# Patient Record
Sex: Female | Born: 1975 | Race: Black or African American | Hispanic: No | Marital: Single | Smoking: Never smoker
Health system: Southern US, Community
[De-identification: ages and names within clinical notes are randomized; demographics above are authoritative.]

## PROBLEM LIST (undated history)

## (undated) DIAGNOSIS — I10 Essential (primary) hypertension: Secondary | ICD-10-CM

## (undated) DIAGNOSIS — D509 Iron deficiency anemia, unspecified: Secondary | ICD-10-CM

## (undated) DIAGNOSIS — G932 Benign intracranial hypertension: Secondary | ICD-10-CM

## (undated) DIAGNOSIS — K219 Gastro-esophageal reflux disease without esophagitis: Secondary | ICD-10-CM

## (undated) DIAGNOSIS — N809 Endometriosis, unspecified: Secondary | ICD-10-CM

## (undated) DIAGNOSIS — N921 Excessive and frequent menstruation with irregular cycle: Secondary | ICD-10-CM

## (undated) DIAGNOSIS — E876 Hypokalemia: Secondary | ICD-10-CM

## (undated) HISTORY — DX: Essential (primary) hypertension: I10

## (undated) HISTORY — DX: Iron deficiency anemia, unspecified: D50.9

## (undated) HISTORY — DX: Gastro-esophageal reflux disease without esophagitis: K21.9

## (undated) HISTORY — DX: Endometriosis, unspecified: N80.9

## (undated) HISTORY — DX: Morbid (severe) obesity due to excess calories: E66.01

## (undated) HISTORY — DX: Hypokalemia: E87.6

## (undated) HISTORY — DX: Benign intracranial hypertension: G93.2

## (undated) HISTORY — DX: Excessive and frequent menstruation with irregular cycle: N92.1

---

## 2000-07-21 HISTORY — PX: TUBAL LIGATION: SHX77

## 2004-06-12 ENCOUNTER — Ambulatory Visit: Payer: Self-pay | Admitting: Neurology

## 2004-10-24 ENCOUNTER — Ambulatory Visit: Payer: Self-pay | Admitting: Neurology

## 2004-10-28 ENCOUNTER — Ambulatory Visit: Payer: Self-pay | Admitting: Neurology

## 2005-03-07 ENCOUNTER — Ambulatory Visit: Payer: Self-pay | Admitting: Neurology

## 2005-07-21 HISTORY — PX: TONSILLECTOMY: SUR1361

## 2006-01-07 ENCOUNTER — Ambulatory Visit: Payer: Self-pay | Admitting: Otolaryngology

## 2007-02-25 ENCOUNTER — Emergency Department: Payer: Self-pay | Admitting: Unknown Physician Specialty

## 2011-01-17 ENCOUNTER — Inpatient Hospital Stay (HOSPITAL_COMMUNITY)
Admission: AD | Admit: 2011-01-17 | Discharge: 2011-01-17 | Disposition: A | Discharge status: Home / Self Care | Payer: 59 | Source: Ambulatory Visit | Attending: Obstetrics & Gynecology | Admitting: Obstetrics & Gynecology

## 2011-01-17 DIAGNOSIS — R42 Dizziness and giddiness: Secondary | ICD-10-CM

## 2011-01-17 DIAGNOSIS — D649 Anemia, unspecified: Secondary | ICD-10-CM | POA: Insufficient documentation

## 2011-01-17 LAB — URINE MICROSCOPIC-ADD ON

## 2011-01-17 LAB — URINALYSIS, ROUTINE W REFLEX MICROSCOPIC
Glucose, UA: NEGATIVE mg/dL
Leukocytes, UA: NEGATIVE
Protein, ur: 100 mg/dL — AB
Specific Gravity, Urine: >1.03 — ABNORMAL HIGH (ref 1.005–1.030)
pH: 6 (ref 5.0–8.0)

## 2011-01-17 LAB — COMPREHENSIVE METABOLIC PANEL
Albumin: 3.2 g/dL — ABNORMAL LOW (ref 3.5–5.2)
BUN: 14 mg/dL (ref 6–23)
Chloride: 101 mEq/L (ref 96–112)
Creatinine, Ser: 0.84 mg/dL (ref 0.50–1.10)
Total Bilirubin: 0.3 mg/dL (ref 0.3–1.2)

## 2011-01-17 LAB — GLUCOSE, CAPILLARY: Glucose-Capillary: 110 mg/dL — ABNORMAL HIGH (ref 70–99)

## 2011-01-17 LAB — CBC
Hemoglobin: 9.8 g/dL — ABNORMAL LOW (ref 12.0–15.0)
RBC: 4.28 MIL/uL (ref 3.87–5.11)
WBC: 7.8 10*3/uL (ref 4.0–10.5)

## 2011-01-17 LAB — POCT PREGNANCY, URINE: Preg Test, Ur: NEGATIVE

## 2011-06-19 ENCOUNTER — Emergency Department (INDEPENDENT_AMBULATORY_CARE_PROVIDER_SITE_OTHER): Payer: 59

## 2011-06-19 ENCOUNTER — Emergency Department (HOSPITAL_COMMUNITY)
Admission: EM | Admit: 2011-06-19 | Discharge: 2011-06-19 | Disposition: A | Discharge status: Home / Self Care | Payer: 59 | Source: Home / Self Care | Attending: Family Medicine | Admitting: Family Medicine

## 2011-06-19 DIAGNOSIS — S6390XA Sprain of unspecified part of unspecified wrist and hand, initial encounter: Secondary | ICD-10-CM

## 2011-06-19 DIAGNOSIS — S63619A Unspecified sprain of unspecified finger, initial encounter: Secondary | ICD-10-CM

## 2011-06-19 NOTE — ED Notes (Signed)
Pt states she shut door (inside her home) on her rt index finger last Friday.  C/o swelling, pain and limited movement rt index finger.

## 2011-06-19 NOTE — ED Provider Notes (Signed)
History     CSN: [id number] Arrival date & time: 06/19/2011  8:45 AM   First MD Initiated Contact with Patient 06/19/11 [phone]      Chief Complaint  Patient presents with  . Finger Injury    (Consider location/radiation/quality/duration/timing/severity/associated sxs/prior treatment) Patient is a 35 y.o. female presenting with hand injury. The history is provided by the patient.  Hand Injury  The incident occurred more than 2 days ago. The incident occurred at home. Injury mechanism: closed a door on right index finger. The quality of the pain is described as aching. The pain is mild. The symptoms are aggravated by movement. She has tried nothing for the symptoms.    History reviewed. No pertinent past medical history.  History reviewed. No pertinent past surgical history.  History reviewed. No pertinent family history.  History  Substance Use Topics  . Smoking status: Never Smoker   . Smokeless tobacco: Not on file  . Alcohol Use: Yes     occasional    OB History    Grav Para Term Preterm Abortions TAB SAB Ect Mult Living                  Review of Systems  Constitutional: Negative.   HENT: Negative.   Respiratory: Negative.   Cardiovascular: Negative.   Gastrointestinal: Negative.   Genitourinary: Negative.     Allergies  Review of patient's allergies indicates no known allergies.  Home Medications  No current outpatient prescriptions on file.  BP 153/91  Pulse 82  Temp(Src) 98.5 F (36.9 C) (Oral)  Resp 18  SpO2 100%  LMP 06/06/2011  Physical Exam  Nursing note and vitals reviewed. Constitutional: She appears well-developed and well-nourished.  Cardiovascular: Normal rate, regular rhythm and normal heart sounds.   Pulmonary/Chest: Breath sounds normal.  Musculoskeletal:       eval of the right index finger reveals swelling. No deformity. Pain at the pip joint. No skin discoloration and skin is intact. Pain with rom. Good cap refill . Sensory  intact distally.     ED Course  Procedures (including critical care time)  Labs Reviewed - No data to display Dg Finger Index Right  06/19/2011  *RADIOLOGY REPORT*  Clinical Data: Slammed the right index finger in door  RIGHT INDEX FINGER 2+V  Comparison: None.  Findings: There is diffuse soft tissue swelling about the index finger.  Tiny osseous structure adjacent to the volar aspect of the PIP joint may represent the sequela of age indeterminate avulsion injury.  Otherwise, no fracture.  No radiopaque foreign body. Joint spaces are preserved.  IMPRESSION: Osseous structure adjacent to the volar aspect of the PIP joint possibly the sequela of age indeterminate avulsive injury. Correlation for point tenderness at this location is recommended.  Original Report Authenticated By: Waynard Reeds, M.D.     1. Finger sprain       MDM          Randa Spike, MD 06/19/11 1038

## 2011-10-14 LAB — HM PAP SMEAR

## 2012-10-13 ENCOUNTER — Ambulatory Visit (INDEPENDENT_AMBULATORY_CARE_PROVIDER_SITE_OTHER): Payer: 59 | Admitting: Internal Medicine

## 2012-10-13 ENCOUNTER — Encounter: Payer: Self-pay | Admitting: Internal Medicine

## 2012-10-13 VITALS — BP 120/88 | HR 95 | Temp 98.0°F | Ht 67.5 in | Wt 247.8 lb

## 2012-10-13 DIAGNOSIS — K219 Gastro-esophageal reflux disease without esophagitis: Secondary | ICD-10-CM

## 2012-10-13 DIAGNOSIS — G932 Benign intracranial hypertension: Secondary | ICD-10-CM

## 2012-10-13 DIAGNOSIS — D649 Anemia, unspecified: Secondary | ICD-10-CM

## 2012-10-13 DIAGNOSIS — R5383 Other fatigue: Secondary | ICD-10-CM

## 2012-10-13 DIAGNOSIS — K5900 Constipation, unspecified: Secondary | ICD-10-CM

## 2012-10-13 DIAGNOSIS — R011 Cardiac murmur, unspecified: Secondary | ICD-10-CM

## 2012-10-13 DIAGNOSIS — Z13220 Encounter for screening for lipoid disorders: Secondary | ICD-10-CM

## 2012-10-13 DIAGNOSIS — I10 Essential (primary) hypertension: Secondary | ICD-10-CM

## 2012-10-13 MED ORDER — PANTOPRAZOLE SODIUM 40 MG PO TBEC: 40.0000 mg | DELAYED_RELEASE_TABLET | Freq: Every day | ORAL | Status: DC

## 2012-10-13 NOTE — Patient Instructions (Addendum)
I am going to start you on protonix 40mg  - one per day.  Take 30 min before breakfast.  Let me know if persistent problems.

## 2012-10-14 ENCOUNTER — Encounter: Payer: Self-pay | Admitting: Internal Medicine

## 2012-10-14 DIAGNOSIS — D649 Anemia, unspecified: Secondary | ICD-10-CM | POA: Insufficient documentation

## 2012-10-14 DIAGNOSIS — K5909 Other constipation: Secondary | ICD-10-CM | POA: Insufficient documentation

## 2012-10-14 DIAGNOSIS — I10 Essential (primary) hypertension: Secondary | ICD-10-CM | POA: Insufficient documentation

## 2012-10-14 DIAGNOSIS — G932 Benign intracranial hypertension: Secondary | ICD-10-CM | POA: Insufficient documentation

## 2012-10-14 DIAGNOSIS — K219 Gastro-esophageal reflux disease without esophagitis: Secondary | ICD-10-CM | POA: Insufficient documentation

## 2012-10-14 DIAGNOSIS — K5900 Constipation, unspecified: Secondary | ICD-10-CM | POA: Insufficient documentation

## 2012-10-14 HISTORY — DX: Essential (primary) hypertension: I10

## 2012-10-14 NOTE — Progress Notes (Signed)
Subjective:    Patient ID: Rachel Salas, female    DOB: [birth year], 37 y.o.   MRN: [id number]  HPI 37 year old female with past history of pseudotumor cerebri and hypertension who comes in today to follow up on these issues and to establish care.  She has been followed by Dr Dayna Barker.  States she was diagnosed with pseudotumor cerebri - 2007.  Was followed by neurology and opthalmology.  Was given a trial of diamox.  Did not tolerate.  Previously was on spironolactone for her blood pressure.  Not taking anything now.  Stopped following up with neurology and opthalmology.  States that for the past week she has noticed eyes bothering her more.  States previously she was told her optic nerve was pinched.  Not bothering her as bad as it was then, but does appear to be having some increased symptoms.  Agreeable to be referred back to neurology and opthalmology.  No headache.  No increased sinus or allergy symptoms.  Is concerned because she is gaining some of her weight back.  Had lost down to 215 pounds (from 294).  States she is not exercising.  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  Off schedule with her eating as well.  Has had increased acid reflux and increased problems with constipation.  No blood.  Stays hydrated.     Past Medical History  Diagnosis Date  . Hypertension   . Pseudotumor cerebri dx 2007  . GERD (gastroesophageal reflux disease)   . Anemia     Review of Systems Patient denies any headache, lightheadedness or dizziness. Does report her eyes have just been bothering her more.  No symptoms as intense as she felt previously.  No increased sinus or allergy problems.   No chest pain, tightness or palpitations.  No increased shortness of breath, cough or congestion.  No nausea or vomiting.  Does report the acid reflux as outlined.  Worsening over the last couple of months.  Describes hot water, acid and burning - up into her throat.  No abdominal pain or cramping.   No diarrhea, BRBPR or melana.  Has had increased problems with constipation.  No urine change.   LMP 09/14/12.  Regularly.  Does not use protection.  Not actively trying to get pregnant.  States she has been eating a lot of ice.  Has a history of anemia.      Objective:   Physical Exam Filed Vitals:   10/13/12 0811  BP: 120/88  Pulse: 95  Temp: 98 F (36.7 C)   Blood pressure recheck:  124/82, pulse 16  37 year old female in no acute distress.   HEENT:  Nares- clear.  Oropharynx - without lesions.  TM visualized - without erythema.  NECK:  Supple.  Nontender.  No audible bruit.  HEART:  Appears to be regular. LUNGS:  No crackles or wheezing audible.  Respirations even and unlabored.  RADIAL PULSE:  Equal bilaterally.  ABDOMEN:  Soft, nontender.  Bowel sounds present and normal.  No audible abdominal bruit.   EXTREMITIES:  No increased edema present.  DP pulses palpable and equal bilaterally.      SKIN:  No rash.       Assessment & Plan:  FATIGUE.  Does report fatigue.  Eating a lot of ice.  Has a history of anemia.  Weight gain.  Check cbc/ferritin, met c and tsh.   HEALTH MAINTENANCE.  States she had her physical around 7/13.  Obtain  records.   I spent 45 minutes with this patient with more than 50% of the time spent in consultation regarding the above.

## 2012-10-14 NOTE — Assessment & Plan Note (Signed)
Increased reflux as outlined.  Start protonix 40 mg q day.  Get her back in soon to reassess.  May need GI evaluation for EGD.

## 2012-10-14 NOTE — Assessment & Plan Note (Signed)
Has a history of anemia.  Eating a lot of ice. Check cbc/ferritin.

## 2012-10-14 NOTE — Assessment & Plan Note (Signed)
Blood pressure as outlined on no medication.  Follow.  Check metabolic panel.

## 2012-10-14 NOTE — Assessment & Plan Note (Signed)
Persistent problem.  Discussed regular use of miralax.  Stay hydrated. Follow.  Discussed the use of an enema or suppository prn.  May need GI evaluation.

## 2012-10-14 NOTE — Assessment & Plan Note (Signed)
Not following with neurology or opthalmology now.  Symptoms as outlined.  Schedule an appt with neurology and opthalmology.

## 2012-10-15 ENCOUNTER — Other Ambulatory Visit (INDEPENDENT_AMBULATORY_CARE_PROVIDER_SITE_OTHER): Payer: 59

## 2012-10-15 ENCOUNTER — Telehealth: Payer: Self-pay | Admitting: Internal Medicine

## 2012-10-15 ENCOUNTER — Other Ambulatory Visit: Payer: Self-pay | Admitting: Internal Medicine

## 2012-10-15 DIAGNOSIS — R5381 Other malaise: Secondary | ICD-10-CM

## 2012-10-15 DIAGNOSIS — Z13220 Encounter for screening for lipoid disorders: Secondary | ICD-10-CM

## 2012-10-15 DIAGNOSIS — D649 Anemia, unspecified: Secondary | ICD-10-CM

## 2012-10-15 DIAGNOSIS — R5383 Other fatigue: Secondary | ICD-10-CM

## 2012-10-15 LAB — COMPREHENSIVE METABOLIC PANEL
Albumin: 3.6 g/dL (ref 3.5–5.2)
CO2: 24 mEq/L (ref 19–32)
Chloride: 106 mEq/L (ref 96–112)
GFR: 109.89 mL/min (ref >60.00)
Glucose, Bld: 95 mg/dL (ref 70–99)
Potassium: 3.6 mEq/L (ref 3.5–5.1)
Sodium: 136 mEq/L (ref 135–145)
Total Protein: 7.4 g/dL (ref 6.0–8.3)

## 2012-10-15 LAB — CBC WITH DIFFERENTIAL/PLATELET
Eosinophils Relative: 3.1 % (ref 0.0–5.0)
HCT: 22.9 % — CL (ref 36.0–46.0)
Lymphocytes Relative: 34.5 % (ref 12.0–46.0)
Lymphs Abs: 2.6 10*3/uL (ref 0.7–4.0)
Monocytes Relative: 4.9 % (ref 3.0–12.0)
Neutrophils Relative %: 57.1 % (ref 43.0–77.0)
Platelets: 292 10*3/uL (ref 150.0–400.0)
WBC: 7.4 10*3/uL (ref 4.5–10.5)

## 2012-10-15 LAB — TSH: TSH: 2.64 u[IU]/mL (ref 0.35–5.50)

## 2012-10-15 LAB — FERRITIN: Ferritin: 4.5 ng/mL — ABNORMAL LOW (ref 10.0–291.0)

## 2012-10-15 LAB — LIPID PANEL: Cholesterol: 142 mg/dL (ref 0–200)

## 2012-10-15 LAB — IBC PANEL
Iron: 14 ug/dL — ABNORMAL LOW (ref 42–145)
Saturation Ratios: 3.2 % — ABNORMAL LOW (ref 20.0–50.0)

## 2012-10-15 NOTE — Telephone Encounter (Signed)
Appointment made

## 2012-10-15 NOTE — Telephone Encounter (Signed)
Pt notified of labs and need for repeat hgb soon.  Please put on lab schedule for Monday 10/18/12 8:00.  Pt aware.

## 2012-10-15 NOTE — Progress Notes (Signed)
Order placed for follow up hgb

## 2012-10-18 ENCOUNTER — Other Ambulatory Visit (INDEPENDENT_AMBULATORY_CARE_PROVIDER_SITE_OTHER): Payer: 59

## 2012-10-18 ENCOUNTER — Telehealth: Payer: Self-pay | Admitting: *Deleted

## 2012-10-18 DIAGNOSIS — D649 Anemia, unspecified: Secondary | ICD-10-CM

## 2012-10-18 LAB — HEMOGLOBIN: Hemoglobin: 7.6 g/dL — CL (ref 12.0–15.0)

## 2012-10-18 NOTE — Telephone Encounter (Signed)
Duplicate

## 2012-10-18 NOTE — Telephone Encounter (Signed)
Notify pt that her hemoglobin has improved (now 7.6 from 7.1).  Continue on the iron as she has been doing.  Recheck cbc in 2 weeks.  I will place order.  Please schedule her for a non fasting lab in 2 weeks

## 2012-10-18 NOTE — Telephone Encounter (Signed)
Critical Hemoglobin @ 7.6 ?

## 2012-10-18 NOTE — Telephone Encounter (Signed)
Patient notified and appointment scheduled. 

## 2012-10-26 ENCOUNTER — Ambulatory Visit: Payer: Self-pay | Admitting: Internal Medicine

## 2012-10-29 ENCOUNTER — Telehealth: Payer: Self-pay

## 2012-11-08 ENCOUNTER — Encounter: Payer: Self-pay | Admitting: Internal Medicine

## 2012-11-08 ENCOUNTER — Other Ambulatory Visit (INDEPENDENT_AMBULATORY_CARE_PROVIDER_SITE_OTHER): Payer: 59

## 2012-11-08 DIAGNOSIS — D649 Anemia, unspecified: Secondary | ICD-10-CM

## 2012-11-08 LAB — CBC WITH DIFFERENTIAL/PLATELET
Basophils Absolute: 0 10*3/uL (ref 0.0–0.1)
Eosinophils Absolute: 0.1 10*3/uL (ref 0.0–0.7)
HCT: 30.9 % — ABNORMAL LOW (ref 36.0–46.0)
Lymphocytes Relative: 26 % (ref 12.0–46.0)
Lymphs Abs: 2.2 10*3/uL (ref 0.7–4.0)
MCHC: 31.5 g/dL (ref 30.0–36.0)
Monocytes Relative: 6.2 % (ref 3.0–12.0)
Platelets: 322 10*3/uL (ref 150.0–400.0)
RDW: 31.9 % — ABNORMAL HIGH (ref 11.5–14.6)

## 2012-11-09 ENCOUNTER — Encounter: Payer: Self-pay | Admitting: Internal Medicine

## 2012-11-09 ENCOUNTER — Telehealth: Payer: Self-pay

## 2012-11-09 NOTE — Telephone Encounter (Signed)
Called patient to inform her that she can take zyrtec over the counter and her number has been changed. Called her mother and they had the same number for the patient asked the mother to let the patient know if she can give the office a call back.

## 2012-11-09 NOTE — Telephone Encounter (Signed)
I am ok if she takes zyrtec - it is over the counter

## 2012-11-09 NOTE — Telephone Encounter (Signed)
Patient wants to know if he can get a prescription for some Zyrtec because of this pollen (allergies)

## 2012-11-10 NOTE — Telephone Encounter (Signed)
Pt called back, advised to take Zyrtec over the counter and to call back if symptoms persist. Requested a phone number we can reach pt at for future calls since the phone number we have is inaccurate. Phone disconnected before number given.

## 2012-11-17 ENCOUNTER — Encounter: Payer: Self-pay | Admitting: Internal Medicine

## 2012-11-18 ENCOUNTER — Telehealth: Payer: Self-pay

## 2012-11-18 NOTE — Telephone Encounter (Signed)
Patient left message on voicemail wanting ECHO results. Called patient and left message for patient to return call to office for results. ECHO RESULTS: ECHO reveals normal heart function. No significant valve abnormality. Per Dr. Lorin Picket

## 2012-11-19 NOTE — Telephone Encounter (Signed)
Opened in error

## 2012-11-25 ENCOUNTER — Ambulatory Visit (INDEPENDENT_AMBULATORY_CARE_PROVIDER_SITE_OTHER): Payer: 59 | Admitting: Internal Medicine

## 2012-11-25 ENCOUNTER — Encounter: Payer: Self-pay | Admitting: Internal Medicine

## 2012-11-25 VITALS — BP 110/70 | HR 80 | Temp 98.7°F | Ht 67.5 in | Wt 252.2 lb

## 2012-11-25 DIAGNOSIS — I10 Essential (primary) hypertension: Secondary | ICD-10-CM

## 2012-11-25 DIAGNOSIS — D649 Anemia, unspecified: Secondary | ICD-10-CM

## 2012-11-25 DIAGNOSIS — K5900 Constipation, unspecified: Secondary | ICD-10-CM

## 2012-11-25 DIAGNOSIS — K219 Gastro-esophageal reflux disease without esophagitis: Secondary | ICD-10-CM

## 2012-11-25 DIAGNOSIS — G932 Benign intracranial hypertension: Secondary | ICD-10-CM

## 2012-11-25 NOTE — Progress Notes (Signed)
  Subjective:    Patient ID: Rachel Salas, female    DOB: 02-15-1976, 37 y.o.   MRN: [id number]  HPI 37 year old female with past history of pseudotumor cerebri and hypertension who comes in today for a scheduled follow up.  Was diagnosed with pseudotumor cerebri - 2007.  Was followed by neurology and opthalmology.  Was given a trial of diamox.  Did not tolerate.  Previously was on spironolactone for her blood pressure.  Not taking anything now.  Blood pressure doing well.  Recently had been having some increased pressure and vision changes with certain head movements.  Saw neurology.  Has an appt with opthalmology next week.  On protonix now.  On iron now.  Hgb improved - 9.7 last check.  Overall she feels better.  Energy has improved.  No cardiac symptoms with increased activity or exertion.  She did report her right great toe feels numb on the tip.  No pain.  No other numbness or tingling.      Past Medical History  Diagnosis Date  . Hypertension   . Pseudotumor cerebri dx 2007  . GERD (gastroesophageal reflux disease)   . Anemia     Review of Systems Patient denies any headache, lightheadedness or dizziness. Does report her eyes have just been bothering her more.  Bending forward aggravates.   No symptoms as intense as she felt previously. Saw neurology.  Has an appt with opthalmology next week.  No increased sinus or allergy problems.   No chest pain, tightness or palpitations.  No increased shortness of breath, cough or congestion.  No nausea or vomiting.  No acid reflux on protonix.  Protonix started last visit.  No abdominal pain or cramping.  No diarrhea, BRBPR or melana.  Has had increased problems with constipation.  No urine change.       Objective:   Physical Exam  Filed Vitals:   11/25/12 0800  BP: 110/70  Pulse: 80  Temp: 98.7 F (37.1 C)   Pulse 63  37 year old female in no acute distress.   HEENT:  Nares- clear.  Oropharynx - without lesions.   NECK:  Supple.   Nontender.  No audible bruit.  HEART:  Appears to be regular. LUNGS:  No crackles or wheezing audible.  Respirations even and unlabored.  RADIAL PULSE:  Equal bilaterally.  ABDOMEN:  Soft, nontender.  Bowel sounds present and normal.  No audible abdominal bruit.   EXTREMITIES:  No increased edema present.  DP pulses palpable and equal bilaterally.       Assessment & Plan:  FATIGUE.  Improved since being on iron.  Hgb increase to 9.7 11/08/12 check.   TOE NUMBNESS.  Sensation change isolated to one toe.  No sensory change on exam.  Discussed not wearing narrow tip shoes, etc.  Support hose.  Will hold on further w/up.   HEALTH MAINTENANCE.  States she had her physical around 7/13.  Schedule a physical for next visit.

## 2012-11-28 ENCOUNTER — Encounter: Payer: Self-pay | Admitting: Internal Medicine

## 2012-11-28 ENCOUNTER — Telehealth: Payer: Self-pay | Admitting: Internal Medicine

## 2012-11-28 NOTE — Assessment & Plan Note (Signed)
On protonix.  Reflux resolved.  Follow.

## 2012-11-28 NOTE — Assessment & Plan Note (Signed)
Blood pressure as outlined on no medication.  Follow.  

## 2012-11-28 NOTE — Assessment & Plan Note (Signed)
Persistent problem.  Discussed regular use of miralax.  Stay hydrated. Follow.

## 2012-11-28 NOTE — Assessment & Plan Note (Signed)
On iron.  Last hgb improved.  9.7.  Follow.

## 2012-11-28 NOTE — Assessment & Plan Note (Signed)
Saw neurology.  Symptoms as outlined.  Due to see opthalmology next week.

## 2012-11-28 NOTE — Telephone Encounter (Signed)
Please change next appt to physical and not follow up appt.

## 2012-11-29 NOTE — Telephone Encounter (Signed)
Appointment changed to cpe

## 2013-02-11 ENCOUNTER — Encounter: Payer: Self-pay | Admitting: Adult Health

## 2013-02-11 ENCOUNTER — Ambulatory Visit (INDEPENDENT_AMBULATORY_CARE_PROVIDER_SITE_OTHER): Payer: 59 | Admitting: Adult Health

## 2013-02-11 VITALS — BP 116/82 | HR 88 | Temp 98.7°F | Resp 12 | Wt 254.5 lb

## 2013-02-11 DIAGNOSIS — Z113 Encounter for screening for infections with a predominantly sexual mode of transmission: Secondary | ICD-10-CM

## 2013-02-11 DIAGNOSIS — N898 Other specified noninflammatory disorders of vagina: Secondary | ICD-10-CM | POA: Insufficient documentation

## 2013-02-11 MED ORDER — METRONIDAZOLE 500 MG PO TABS: ORAL_TABLET | ORAL | Status: DC

## 2013-02-11 NOTE — Assessment & Plan Note (Signed)
Check for chlamydia/gonorrhea, trichomonas, syphilis, HIV, herpes.

## 2013-02-11 NOTE — Progress Notes (Signed)
  Subjective:    Patient ID: Rachel Salas, female    DOB: 22-Jan-1976, 37 y.o.   MRN: [id number]  HPI  Patient is a pleasant 37 year old female who presents to clinic requesting to be tested for STDs. This is an unfortunate situation where patient believes that her husband is being unfaithful. She reports he has done this in the past. She is concerned that he may be careless and transmit an STD. She is having some white, malodorous discharge; however, she believes this is bacterial vaginosis as she has had this in the past and reports symptoms are the same. She does not have any fever, any genital ulcerations.   Current Outpatient Prescriptions on File Prior to Visit  Medication Sig Dispense Refill  . ferrous sulfate 325 (65 FE) MG tablet Take 325 mg by mouth 2 (two) times daily.      . pantoprazole (PROTONIX) 40 MG tablet Take 1 tablet (40 mg total) by mouth daily.  30 tablet  3   No current facility-administered medications on file prior to visit.    Review of Systems  Genitourinary: Positive for vaginal discharge. Negative for dysuria, genital sores and pelvic pain.  All other systems reviewed and are negative.       Objective:   Physical Exam  Constitutional: She is oriented to person, place, and time. She appears well-developed and well-nourished. No distress.  Cardiovascular: Normal rate and regular rhythm.   Pulmonary/Chest: Effort normal. No respiratory distress.  Genitourinary:  Patient wishes to defer GYN exam.  Neurological: She is alert and oriented to person, place, and time.  Psychiatric: She has a normal mood and affect. Her behavior is normal. Judgment and thought content normal.          Assessment & Plan:

## 2013-02-11 NOTE — Patient Instructions (Signed)
  Please have lab work done prior to leaving the office.  We will contact you once the results are available.  Start metronidazole 500 mg twice a day x7 days. Return to clinic if symptoms do not improve.

## 2013-02-11 NOTE — Assessment & Plan Note (Signed)
Symptoms reported by patient resemble bacterial vaginosis. Start metronidazole 500 mg twice a day x7 days. Patient is also being tested for STDs.

## 2013-02-12 LAB — RPR

## 2013-02-14 LAB — HSV(HERPES SMPLX)ABS-I+II(IGG+IGM)-BLD: HSV 2 Glycoprotein G Ab, IgG: 0.14 IV

## 2013-02-16 LAB — CHLAMYDIA/GONOCOCCUS/TRICHOMONAS, NAA: Trich vag by NAA: NEGATIVE

## 2013-02-21 ENCOUNTER — Telehealth: Payer: Self-pay | Admitting: Internal Medicine

## 2013-02-21 ENCOUNTER — Other Ambulatory Visit: Payer: Self-pay | Admitting: Adult Health

## 2013-02-21 MED ORDER — FLUCONAZOLE 150 MG PO TABS: 150.0000 mg | ORAL_TABLET | Freq: Once | ORAL | Status: DC

## 2013-02-21 NOTE — Telephone Encounter (Signed)
I sent it in and call Rachel Salas.

## 2013-02-21 NOTE — Telephone Encounter (Signed)
Yes, ok to send

## 2013-02-21 NOTE — Telephone Encounter (Signed)
Asking if Rachel Salas would cal her in some diflucan, or something for a yeast infection.  Pt states the antibiotic she was prescribed last week has caused her some issues all weekend.  States she would like to use [facility] on [address] This time.  Asking for a call when that order has been placed.

## 2013-02-21 NOTE — Telephone Encounter (Signed)
Ok to send

## 2013-02-23 ENCOUNTER — Other Ambulatory Visit: Payer: 59

## 2013-02-25 ENCOUNTER — Encounter: Payer: 59 | Admitting: Internal Medicine

## 2013-05-26 ENCOUNTER — Other Ambulatory Visit: Payer: Self-pay

## 2013-06-02 ENCOUNTER — Ambulatory Visit: Payer: 59 | Admitting: Adult Health

## 2013-06-08 ENCOUNTER — Ambulatory Visit: Payer: 59 | Admitting: Adult Health

## 2013-06-17 ENCOUNTER — Telehealth: Payer: Self-pay | Admitting: Internal Medicine

## 2013-06-17 NOTE — Telephone Encounter (Signed)
Unable to call in abx over the phone for her  - have not seen her.  Needs evaluation if having acute symptoms and then can know how to treat.  Recommend eval at Saturday clinic or acute care.

## 2013-06-17 NOTE — Telephone Encounter (Signed)
Please advise 

## 2013-06-17 NOTE — Telephone Encounter (Signed)
Patient Information:  Caller Name: N/A  Phone: [phone]  Patient: Rachel Salas, Rachel Salas  Gender: Female  DOB: [birth year]  Age: 37 Years  PCP: [facility] [place]  Pregnant: No  Office Follow Up:  Does the office need to follow up with this patient?: Yes  Instructions For The Office: No appts. available. Please return call to patient at [phone] regarding work in appt. Patient uses [facility] at [phone].  RN Note:  Patient states she developed urinary urgency accompanied by dark, cloudy, foul smelling urine. Onset X 1 week. Denies vaginal discharge or odor. Denies flank or abdominal pain. Denies hematuria. Care advice given per guidelines. Call back parameters reviewed. Patient verbalizes understanding. No appts. available. Please return call to patient at [phone] regarding work in appt. Patient uses [facility] at [phone].  Symptoms  Reason For Call & Symptoms: dark, cloudy, foul smelling urine, urinary urgency  Reviewed Health History In EMR: Yes  Reviewed Medications In EMR: Yes  Reviewed Allergies In EMR: Yes  Reviewed Surgeries / Procedures: Yes  Date of Onset of Symptoms: 06/10/2013  Treatments Tried: Increased fluids  Treatments Tried Worked: No OB / GYN:  LMP: 05/23/2013  Guideline(s) Used:  Urination Pain - Female  Disposition Per Guideline:   See Today in Office  Reason For Disposition Reached:   Painful urination AND EITHER frequency or urgency  Advice Given:  Fluids:   Drink extra fluids. Drink 8-10 glasses of liquids a day (Reason: to produce a dilute, non-irritating urine).  Cranberry Juice:   Some people think that drinking cranberry juice may help in fighting urinary tract infections. However, there is no good research that has ever proved this.  Call Back If:  You become worse.  Fluids:   Drink extra fluids. Drink 8-10 glasses of liquids a day. (Reason: to produce a dilute, non-irritating urine)  Call Back  If:   You become worse.  Patient Will Follow Care Advice:  YES

## 2013-06-17 NOTE — Telephone Encounter (Signed)
Pt notified of recommendations & options

## 2013-06-18 ENCOUNTER — Encounter: Payer: Self-pay | Admitting: Family Medicine

## 2013-06-18 ENCOUNTER — Ambulatory Visit (INDEPENDENT_AMBULATORY_CARE_PROVIDER_SITE_OTHER): Payer: 59 | Admitting: Family Medicine

## 2013-06-18 VITALS — BP 124/80 | Temp 98.5°F | Wt 253.0 lb

## 2013-06-18 DIAGNOSIS — R300 Dysuria: Secondary | ICD-10-CM

## 2013-06-18 DIAGNOSIS — N390 Urinary tract infection, site not specified: Secondary | ICD-10-CM

## 2013-06-18 LAB — POCT URINALYSIS DIPSTICK
Bilirubin, UA: NEGATIVE
Glucose, UA: NEGATIVE
Nitrite, UA: NEGATIVE
Urobilinogen, UA: 0.2

## 2013-06-18 MED ORDER — SULFAMETHOXAZOLE-TMP DS 800-160 MG PO TABS: 1.0000 | ORAL_TABLET | Freq: Two times a day (BID) | ORAL | Status: DC

## 2013-06-18 NOTE — Patient Instructions (Signed)
Drink plenty of water and start the antibiotics today.  We'll contact you with your lab report.  Take care.   

## 2013-06-18 NOTE — Assessment & Plan Note (Signed)
Presumed.  Septra, ucx, fluids, f/u prn. Nontoxic.  D/w pt.

## 2013-06-18 NOTE — Progress Notes (Signed)
Dysuria: yes, odor was different, cloudy appearance.  No burning, but frequency noted.   duration of symptoms:started last week, some better with inc in fluids abdominal pain:no fevers:no back pain:no vomiting:no  Meds, vitals, and allergies reviewed.   ROS: See HPI.  Otherwise negative.    GEN: nad, alert and oriented HEENT: mucous membranes moist NECK: supple CV: rrr.  PULM: ctab, no inc wob ABD: soft, +bs, suprapubic area not tender EXT: no edema SKIN: no acute rash BACK: no CVA pain

## 2013-06-22 ENCOUNTER — Encounter: Payer: Self-pay | Admitting: Family Medicine

## 2013-08-30 ENCOUNTER — Ambulatory Visit (INDEPENDENT_AMBULATORY_CARE_PROVIDER_SITE_OTHER): Payer: 59 | Admitting: Internal Medicine

## 2013-08-30 ENCOUNTER — Encounter: Payer: Self-pay | Admitting: Internal Medicine

## 2013-08-30 VITALS — BP 124/86 | HR 64 | Temp 97.9°F | Wt 264.0 lb

## 2013-08-30 DIAGNOSIS — R609 Edema, unspecified: Secondary | ICD-10-CM

## 2013-08-30 MED ORDER — HYDROCHLOROTHIAZIDE 12.5 MG PO CAPS: 12.5000 mg | ORAL_CAPSULE | Freq: Every day | ORAL | Status: DC

## 2013-08-30 NOTE — Progress Notes (Signed)
Subjective:    Patient ID: Rachel Salas, female    DOB: 1976-06-21, 38 y.o.   MRN: [id number]  HPI  Pt presents to the clinic today with leg swelling x 3 days. She reports the swelling is worse in the right. She denies pain, numbness or tingling in the lower extremeties. She denies warmth or redness to either lower extremitie. She does not smoke. She is not on birth control. 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  Review of Systems      Past Medical History  Diagnosis Date  . Hypertension   . Pseudotumor cerebri dx 2007  . GERD (gastroesophageal reflux disease)   . Anemia     Current Outpatient Prescriptions  Medication Sig Dispense Refill  . ferrous sulfate 325 (65 FE) MG tablet Take 325 mg by mouth 2 (two) times daily.      . pantoprazole (PROTONIX) 40 MG tablet Take 1 tablet (40 mg total) by mouth daily.  30 tablet  3   No current facility-administered medications for this visit.    Allergies  Allergen Reactions  . Tylenol [Acetaminophen] Hives    Family History  Problem Relation Age of Onset  . Hypertension Mother   . Hypercholesterolemia Mother   . Thyroid disease Mother   . Colon cancer Neg Hx   . Breast cancer Neg Hx     History   Social History  . Marital Status: Married    Spouse Name: N/A    Number of Children: 3  . Years of Education: N/A   Occupational History  .  [employer]   Social History Main Topics  . Smoking status: Never Smoker   . Smokeless tobacco: Never Used  . Alcohol Use: Yes     Comment: occasional  . Drug Use: No  . Sexual Activity: Yes    Birth Control/ Protection: None   Other Topics Concern  . Not on file   Social History Narrative  . No narrative on file     Constitutional: Denies fever, malaise, fatigue, headache or abrupt weight changes.  Respiratory: Denies difficulty breathing, shortness of breath, cough or sputum  production.   Cardiovascular: Pt reports edema in bilateral feet. Denies chest pain, chest tightness, palpitations or swelling in the [place].   No other specific complaints in a complete review of systems (except as listed in HPI above).  Objective:   Physical Exam   BP 124/86  Pulse 64  Temp(Src) 97.9 F (36.6 C) (Oral)  Wt 264 lb (119.75 kg)  SpO2 98% Wt Readings from Last 3 Encounters:  08/30/13 264 lb (119.75 kg)  06/18/13 253 lb (114.76 kg)  02/11/13 254 lb 8 oz (115.44 kg)    General: Appears her stated age, obese but well developed, well nourished in NAD. Cardiovascular: Normal rate and rhythm. S1,S2 noted.  No murmur, rubs or gallops noted. No JVD. 2 + non pitting edema noted. No carotid bruits noted. Pulmonary/Chest: Normal effort and positive vesicular breath sounds. No respiratory distress. No wheezes, rales or ronchi noted.    BMET    Component Value Date/Time   NA 136 10/15/2012 0757   K 3.6 10/15/2012 0757   CL 106 10/15/2012 0757   CO2 24 10/15/2012 0757   GLUCOSE 95 10/15/2012 0757   BUN 12 10/15/2012 0757   CREATININE 0.8 10/15/2012 0757   CALCIUM 8.3* 10/15/2012 0757   GFRNONAA >  60 01/17/2011 0900   GFRAA >60 01/17/2011 0900    Lipid Panel     Component Value Date/Time   CHOL 142 10/15/2012 0757   TRIG 32.0 10/15/2012 0757   HDL 55.70 10/15/2012 0757   CHOLHDL 3 10/15/2012 0757   VLDL 6.4 10/15/2012 0757   LDLCALC 80 10/15/2012 0757    CBC    Component Value Date/Time   WBC 8.6 11/08/2012 0823   RBC 4.40 11/08/2012 0823   HGB 9.7* 11/08/2012 0823   HCT 30.9* 11/08/2012 0823   PLT 322.0 11/08/2012 0823   MCV 70.1* 11/08/2012 0823   MCH 22.9* 01/17/2011 0900   MCHC 31.5 11/08/2012 0823   RDW 31.9* 11/08/2012 0823   LYMPHSABS 2.2 11/08/2012 0823   MONOABS 0.5 11/08/2012 0823   EOSABS 0.1 11/08/2012 0823   BASOSABS 0.0 11/08/2012 0823    Hgb A1C No results found for this basename: HGBA1C        Assessment & Plan:   Peripheral edema, secondary to salt  intake and standing on her feet:  Advised her to get compression hose to wear while at work Encouraged to maintain a low sodium diet eRx for HCTZ 12.5 mg daily prn for edema If you notice dizziness, lightheadedness, or muscle aches RTC for followup

## 2013-08-30 NOTE — Patient Instructions (Signed)

## 2013-08-30 NOTE — Progress Notes (Signed)
Pre-visit discussion using our clinic review tool. No additional management support is needed unless otherwise documented below in the visit note.  

## 2013-09-14 ENCOUNTER — Encounter: Payer: Self-pay | Admitting: Adult Health

## 2013-12-14 ENCOUNTER — Telehealth: Payer: Self-pay | Admitting: *Deleted

## 2013-12-14 ENCOUNTER — Telehealth: Payer: Self-pay | Admitting: Internal Medicine

## 2013-12-14 ENCOUNTER — Encounter: Payer: Self-pay | Admitting: Family Medicine

## 2013-12-14 ENCOUNTER — Ambulatory Visit (INDEPENDENT_AMBULATORY_CARE_PROVIDER_SITE_OTHER): Payer: 59 | Admitting: Family Medicine

## 2013-12-14 VITALS — BP 118/78 | HR 68 | Temp 98.0°F | Wt 265.0 lb

## 2013-12-14 DIAGNOSIS — M7989 Other specified soft tissue disorders: Secondary | ICD-10-CM

## 2013-12-14 DIAGNOSIS — N898 Other specified noninflammatory disorders of vagina: Secondary | ICD-10-CM

## 2013-12-14 DIAGNOSIS — I10 Essential (primary) hypertension: Secondary | ICD-10-CM

## 2013-12-14 LAB — POCT URINALYSIS DIPSTICK
Blood, UA: NEGATIVE
GLUCOSE UA: NEGATIVE
KETONES UA: NEGATIVE
LEUKOCYTES UA: NEGATIVE
Nitrite, UA: NEGATIVE
Spec Grav, UA: 1.015
UROBILINOGEN UA: 1
pH, UA: 6.5

## 2013-12-14 LAB — POCT WET PREP (WET MOUNT)
Clue Cells Wet Prep Whiff POC: NEGATIVE
KOH Wet Prep POC: NEGATIVE
Trichomonas Wet Prep HPF POC: NEGATIVE

## 2013-12-14 LAB — MICROALBUMIN / CREATININE URINE RATIO
CREATININE, U: 332.7 mg/dL
Microalb Creat Ratio: 0.4 mg/g (ref 0.0–30.0)
Microalb, Ur: 1.2 mg/dL (ref 0.0–1.9)

## 2013-12-14 LAB — CBC WITH DIFFERENTIAL/PLATELET
BASOS PCT: 0.6 % (ref 0.0–3.0)
Basophils Absolute: 0 10*3/uL (ref 0.0–0.1)
EOS PCT: 2 % (ref 0.0–5.0)
Eosinophils Absolute: 0.1 10*3/uL (ref 0.0–0.7)
HEMATOCRIT: 25.1 % — AB (ref 36.0–46.0)
Lymphocytes Relative: 28.8 % (ref 12.0–46.0)
Lymphs Abs: 2.1 10*3/uL (ref 0.7–4.0)
MCHC: 31 g/dL (ref 30.0–36.0)
MCV: 66.2 fl — AB (ref 78.0–100.0)
MONO ABS: 0.4 10*3/uL (ref 0.1–1.0)
Monocytes Relative: 5.2 % (ref 3.0–12.0)
Neutro Abs: 4.6 10*3/uL (ref 1.4–7.7)
Neutrophils Relative %: 63.4 % (ref 43.0–77.0)
Platelets: 329 10*3/uL (ref 150.0–400.0)
RBC: 3.79 Mil/uL — AB (ref 3.87–5.11)
RDW: 20.7 % — ABNORMAL HIGH (ref 11.5–15.5)
WBC: 7.2 10*3/uL (ref 4.0–10.5)

## 2013-12-14 LAB — RENAL FUNCTION PANEL
Albumin: 3.4 g/dL — ABNORMAL LOW (ref 3.5–5.2)
BUN: 11 mg/dL (ref 6–23)
CALCIUM: 8.5 mg/dL (ref 8.4–10.5)
CHLORIDE: 106 meq/L (ref 96–112)
CO2: 24 meq/L (ref 19–32)
Creatinine, Ser: 0.8 mg/dL (ref 0.4–1.2)
GFR: 101.51 mL/min (ref >60.00)
Glucose, Bld: 88 mg/dL (ref 70–99)
POTASSIUM: 3.5 meq/L (ref 3.5–5.1)
Phosphorus: 2.8 mg/dL (ref 2.3–4.6)
SODIUM: 137 meq/L (ref 135–145)

## 2013-12-14 LAB — TSH: TSH: 2.06 u[IU]/mL (ref 0.35–4.50)

## 2013-12-14 MED ORDER — METRONIDAZOLE 500 MG PO TABS: 500.0000 mg | ORAL_TABLET | Freq: Three times a day (TID) | ORAL | Status: DC

## 2013-12-14 MED ORDER — METRONIDAZOLE 500 MG PO TABS: 500.0000 mg | ORAL_TABLET | Freq: Two times a day (BID) | ORAL | Status: DC

## 2013-12-14 NOTE — Telephone Encounter (Signed)
Lori from [place] lab called with critical Hgb of 7.8. Dr. Reece Agar out of office. Results given to Dr. Milinda Antis and sent to Dr Reece Agar as well.

## 2013-12-14 NOTE — Assessment & Plan Note (Signed)
Wet prep consistent with BV. Treat with flagyl, update if sxs persist. Advised avoiding EtOH while on med.

## 2013-12-14 NOTE — Telephone Encounter (Signed)
Noted. See lab results section

## 2013-12-14 NOTE — Telephone Encounter (Signed)
Since PCP has commented on results I have sent message to PCP's assistant

## 2013-12-14 NOTE — Telephone Encounter (Signed)
Pt notified of results & states that she has not been taking her Iron medication. I urged her to restart her Iron asap. Appt scheduled with Dr. Lorin Picket also.

## 2013-12-14 NOTE — Assessment & Plan Note (Signed)
Nonpitting edema with proteinuria on UA - check microalb/cr ratio today. Check Cr and TSH today. Encouraged continued avoidance of salt and increased water intake. ?weight related given noted weight gain over last several months - encouraged weight loss through healthier diet choices. Will update pt with labwork results.

## 2013-12-14 NOTE — Assessment & Plan Note (Signed)
Not a recent issue.

## 2013-12-14 NOTE — Telephone Encounter (Signed)
Relevant patient education assigned to patient using Emmi. ° °

## 2013-12-14 NOTE — Telephone Encounter (Signed)
Dr Reece Agar saw her for an unrelated problem today (before he left) - and her Hb is 7.8- looks like she has a hx of anemia with last Hb 9.7 Please let her know and ask if she is taking her iron  I will also forward to her PCP

## 2013-12-14 NOTE — Patient Instructions (Signed)
For BV - treat with flagyl twice times daily for 7 days. Blood work today to check on leg swelling. We will call you with results. In interim, work on increasing water intake during the day, watch salt, continue compression stockings.  Bacterial Vaginosis Bacterial vaginosis is a vaginal infection that occurs when the normal balance of bacteria in the vagina is disrupted. It results from an overgrowth of certain bacteria. This is the most common vaginal infection in women of childbearing age. Treatment is important to prevent complications, especially in pregnant women, as it can cause a premature delivery. CAUSES  Bacterial vaginosis is caused by an increase in harmful bacteria that are normally present in smaller amounts in the vagina. Several different kinds of bacteria can cause bacterial vaginosis. However, the reason that the condition develops is not fully understood. RISK FACTORS Certain activities or behaviors can put you at an increased risk of developing bacterial vaginosis, including:  Having a new sex partner or multiple sex partners.  Douching.  Using an intrauterine device (IUD) for contraception. Women do not get bacterial vaginosis from toilet seats, bedding, swimming pools, or contact with objects around them. SIGNS AND SYMPTOMS  Some women with bacterial vaginosis have no signs or symptoms. Common symptoms include:  Grey vaginal discharge.  A fishlike odor with discharge, especially after sexual intercourse.  Itching or burning of the vagina and vulva.  Burning or pain with urination. DIAGNOSIS  Your health care provider will take a medical history and examine the vagina for signs of bacterial vaginosis. A sample of vaginal fluid may be taken. Your health care provider will look at this sample under a microscope to check for bacteria and abnormal cells. A vaginal pH test may also be done.  TREATMENT  Bacterial vaginosis may be treated with antibiotic medicines. These  may be given in the form of a pill or a vaginal cream. A second round of antibiotics may be prescribed if the condition comes back after treatment.  HOME CARE INSTRUCTIONS   Only take over-the-counter or prescription medicines as directed by your health care provider.  If antibiotic medicine was prescribed, take it as directed. Make sure you finish it even if you start to feel better.  Do not have sex until treatment is completed.  Tell all sexual partners that you have a vaginal infection. They should see their health care provider and be treated if they have problems, such as a mild rash or itching.  Practice safe sex by using condoms and only having one sex partner. SEEK MEDICAL CARE IF:   Your symptoms are not improving after 3 days of treatment.  You have increased discharge or pain.  You have a fever. MAKE SURE YOU:   Understand these instructions.  Will watch your condition.  Will get help right away if you are not doing well or get worse. FOR MORE INFORMATION  Centers for Disease Control and Prevention, Division of STD Prevention: [employer] American Sexual Health Association (ASHA): www.ashastd.org  Document Released: 07/07/2005 Document Revised: 04/27/2013 Document Reviewed: 02/16/2013 [facility] Patient Information 2014 [place], [place].

## 2013-12-14 NOTE — Progress Notes (Signed)
BP 118/78  Pulse 68  Temp(Src) 98 F (36.7 C) (Oral)  Wt 265 lb (120.203 kg)  SpO2 98%   CC: vag discharge, discuss leg swelling Subjective:    Patient ID: Rachel Salas, female    DOB: Aug 16, 1975, 38 y.o.   MRN: [id number]  HPI: Rachel Salas is a 38 y.o. female presenting on 12/14/2013 for Vaginal Discharge and Leg Swelling   38 yo patient of Dr. Roby Lofts with h/o pseudotumor cerebri, h/o HTN, anemia and GERD presents today with 2 complaints:  Leg swelling R>L - ongoing for last 3 months. Seen by Rene Kocher and prescribed hctz 12.5mg  daily prn swelling. Previously attributed to salt intake and long shifts on her feet at work. Also recommended use compression stocking which she has used. She took hctz for 2 wks which helped, but stopped taking med.  No added salt to food. Tries to minimize chips. Doesn't drink significant amt water. Normal voiding, dysuria, hematuria or nocturia.  Some urgency noted recently. No other edema noted.  No chest pain or dyspnea noted. No headaches or vision changes. Mainly progressive dependent edema.  Vag discharge - h/o recurrent bacterial vaginosis. White thick discharge but doesn't feel like yeast infection. No odor. No meds tried yet. No douching. No new partners. No new products. No abd pain, fevers/chills, nausea.  Wt Readings from Last 3 Encounters:  12/14/13 265 lb (120.203 kg)  08/30/13 264 lb (119.75 kg)  06/18/13 253 lb (114.76 kg)  Body mass index is 40.87 kg/(m^2). weight gain noted over last several months.  Relevant past medical, surgical, family and social history reviewed and updated as indicated.  Allergies and medications reviewed and updated. Current Outpatient Prescriptions on File Prior to Visit  Medication Sig  . ferrous sulfate 325 (65 FE) MG tablet Take 325 mg by mouth 2 (two) times daily.  . hydrochlorothiazide (MICROZIDE) 12.5 MG capsule Take 1 capsule (12.5 mg total) by mouth daily.  . pantoprazole (PROTONIX) 40 MG tablet Take 1  tablet (40 mg total) by mouth daily.   No current facility-administered medications on file prior to visit.    Review of Systems Per HPI unless specifically indicated above    Objective:    BP 118/78  Pulse 68  Temp(Src) 98 F (36.7 C) (Oral)  Wt 265 lb (120.203 kg)  SpO2 98%  Physical Exam  Nursing note and vitals reviewed. Constitutional: She appears well-developed and well-nourished. No distress.  HENT:  Mouth/Throat: Oropharynx is clear and moist. No oropharyngeal exudate.  Eyes: Conjunctivae and EOM are normal. Pupils are equal, round, and reactive to light.  Cardiovascular: Normal rate, regular rhythm and intact distal pulses.   Murmur (faint SEM) heard. Pulmonary/Chest: Effort normal and breath sounds normal. No respiratory distress. She has no wheezes. She has no rales.  Abdominal: Soft. Bowel sounds are normal. She exhibits no distension and no mass. There is no tenderness. There is no rebound and no guarding.  obese  Genitourinary:  Self collection performed for vag specimen  Musculoskeletal: She exhibits edema (nonpitting bilateral edema).  Skin: Skin is warm and dry. No rash noted.  Psychiatric: She has a normal mood and affect.       Assessment & Plan:   Problem List Items Addressed This Visit   Vaginal discharge - Primary     Wet prep consistent with BV. Treat with flagyl, update if sxs persist. Advised avoiding EtOH while on med.    Relevant Orders      POCT Wet Prep [facility] [place]) (  Completed)   Leg swelling     Nonpitting edema with proteinuria on UA - check microalb/cr ratio today. Check Cr and TSH today. Encouraged continued avoidance of salt and increased water intake. ?weight related given noted weight gain over last several months - encouraged weight loss through healthier diet choices. Will update pt with labwork results.    Relevant Orders      TSH      Renal function panel      CBC with Differential      Microalbumin / creatinine urine ratio     Essential hypertension, benign     Not a recent issue.        Follow up plan: Return if symptoms worsen or fail to improve.

## 2013-12-14 NOTE — Progress Notes (Signed)
Pre visit review using our clinic review tool, if applicable. No additional management support is needed unless otherwise documented below in the visit note. 

## 2013-12-14 NOTE — Telephone Encounter (Signed)
Received phone message on this pt.  Was seen at [facility].  Was found to have a low hgb.  Need to confirm how she is doing and if taking her iron.  Should be on ferrous sulfate 325mg  bid.  Needs a f/u appt with me - soon.  Also need to know if any problems.

## 2013-12-14 NOTE — Addendum Note (Signed)
Addended by: Roena Malady on: 12/14/2013 09:35 AM   Modules accepted: Orders

## 2013-12-28 ENCOUNTER — Encounter: Payer: Self-pay | Admitting: Family Medicine

## 2013-12-28 MED ORDER — FLUCONAZOLE 150 MG PO TABS: 150.0000 mg | ORAL_TABLET | Freq: Once | ORAL | Status: DC

## 2013-12-29 ENCOUNTER — Ambulatory Visit: Payer: 59 | Admitting: Internal Medicine

## 2014-01-19 ENCOUNTER — Ambulatory Visit: Payer: 59 | Admitting: Adult Health

## 2014-03-17 ENCOUNTER — Ambulatory Visit: Payer: 59 | Admitting: Adult Health

## 2014-05-01 ENCOUNTER — Other Ambulatory Visit: Payer: Self-pay | Admitting: Internal Medicine

## 2014-05-03 ENCOUNTER — Ambulatory Visit (INDEPENDENT_AMBULATORY_CARE_PROVIDER_SITE_OTHER): Payer: 59 | Admitting: Internal Medicine

## 2014-05-03 ENCOUNTER — Encounter: Payer: Self-pay | Admitting: Internal Medicine

## 2014-05-03 VITALS — BP 124/82 | HR 97 | Temp 98.3°F | Wt 266.0 lb

## 2014-05-03 DIAGNOSIS — M7989 Other specified soft tissue disorders: Secondary | ICD-10-CM

## 2014-05-03 LAB — CBC
HCT: 27.4 % — ABNORMAL LOW (ref 36.0–46.0)
MCHC: 30.6 g/dL (ref 30.0–36.0)
MCV: 63 fl — ABNORMAL LOW (ref 78.0–100.0)
Platelets: 343 10*3/uL (ref 150.0–400.0)
RBC: 4.34 Mil/uL (ref 3.87–5.11)
RDW: 21.2 % — AB (ref 11.5–15.5)
WBC: 9.1 10*3/uL (ref 4.0–10.5)

## 2014-05-03 LAB — COMPREHENSIVE METABOLIC PANEL
ALT: 11 U/L (ref 0–35)
AST: 10 U/L (ref 0–37)
Albumin: 3 g/dL — ABNORMAL LOW (ref 3.5–5.2)
Alkaline Phosphatase: 69 U/L (ref 39–117)
BUN: 17 mg/dL (ref 6–23)
CALCIUM: 8.6 mg/dL (ref 8.4–10.5)
CHLORIDE: 102 meq/L (ref 96–112)
CO2: 28 meq/L (ref 19–32)
Creatinine, Ser: 1 mg/dL (ref 0.4–1.2)
GFR: 77.86 mL/min (ref >60.00)
Glucose, Bld: 92 mg/dL (ref 70–99)
Potassium: 3.2 mEq/L — ABNORMAL LOW (ref 3.5–5.1)
SODIUM: 134 meq/L — AB (ref 135–145)
Total Bilirubin: 0.5 mg/dL (ref 0.2–1.2)
Total Protein: 8.1 g/dL (ref 6.0–8.3)

## 2014-05-03 MED ORDER — HYDROCHLOROTHIAZIDE 25 MG PO TABS: 25.0000 mg | ORAL_TABLET | Freq: Every day | ORAL | Status: DC

## 2014-05-03 NOTE — Assessment & Plan Note (Signed)
I really think this is due to her increase in weight She did show me some pictures where she did have increased swelling Will give eRx for HCTZ 25 mg daily prn if there is swelling Check CBC and CMET Work on weight loss (diet and exercise info given)

## 2014-05-03 NOTE — Progress Notes (Signed)
Subjective:    Patient ID: Rachel FootmanKylie Wilson, female    DOB: [birth year], 38 y.o.   MRN: [id number]  HPI  Pt presents to the clinic today with c/o leg swelling. This has been a chronic issue for her. Seen 08/30/13, started on HCTZ 12.5 daily prn and instructed to wear compression hose. Did help but stopped taking the HCTZ after 2 weeks. Seen again 12/14/13. BMET and TSH where checked which were normal. She was also advised to work on diet and exercise to aid in weight reduction. She reports that they are not as swollen today. She continues to stand for long periods of time at work. She has also gained 20 lbs in the last 6 months. She has been taking her left over HCTZ. She reports that she is actually taking 25 mg when she uses it.  Review of Systems      Past Medical History  Diagnosis Date  . Hypertension   . Pseudotumor cerebri dx 2007  . GERD (gastroesophageal reflux disease)   . Anemia     Current Outpatient Prescriptions  Medication Sig Dispense Refill  . ferrous sulfate 325 (65 FE) MG tablet Take 325 mg by mouth 2 (two) times daily.      . fluconazole (DIFLUCAN) 150 MG tablet Take 1 tablet (150 mg total) by mouth once.  1 tablet  0  . hydrochlorothiazide (MICROZIDE) 12.5 MG capsule Take 1 capsule (12.5 mg total) by mouth daily.  30 capsule  0  . metroNIDAZOLE (FLAGYL) 500 MG tablet Take 1 tablet (500 mg total) by mouth 2 (two) times daily.  14 tablet  0  . pantoprazole (PROTONIX) 40 MG tablet Take 1 tablet (40 mg total) by mouth daily.  30 tablet  3   No current facility-administered medications for this visit.    Allergies  Allergen Reactions  . Tylenol [Acetaminophen] Hives    Family History  Problem Relation Age of Onset  . Hypertension Mother   . Hypercholesterolemia Mother   . Thyroid disease Mother   . Colon cancer Neg Hx   . Breast cancer Neg Hx     History   Social History  . Marital Status: Married    Spouse Name: N/A    Number of Children: 3  . Years of  Education: N/A   Occupational History  .  [employer]   Social History Main Topics  . Smoking status: Never Smoker   . Smokeless tobacco: Never Used  . Alcohol Use: Yes     Comment: occasional  . Drug Use: No  . Sexual Activity: Yes    Birth Control/ Protection: None   Other Topics Concern  . Not on file   Social History Narrative  . No narrative on file     Constitutional: Denies fever, malaise, fatigue, headache or abrupt weight changes.  Respiratory: Denies difficulty breathing, shortness of breath, cough or sputum production.   Cardiovascular: Pt reports swelling in legs. Denies chest pain, chest tightness, palpitations or swelling in the R[place].    No other specific complaints in a complete review of systems (except as listed in HPI above).  Objective:   Physical Exam   BP 124/82  Pulse 97  Temp(Src) 98.3 F (36.8 C) (Oral)  Wt 266 lb (120.657 kg)  SpO2 98%  LMP 04/19/2014 Wt Readings from Last 3 Encounters:  05/03/14 266 lb (120.657 kg)  12/14/13 265 lb (120.203 kg)  08/30/13 264 lb (119.75 kg)    General: Appears her stated age,  obese but well developed, well nourished in NAD. Cardiovascular: Normal rate and rhythm. S1,S2 noted.  No murmur, rubs or gallops noted. No  BLE edema.  Pulmonary/Chest: Normal effort and positive vesicular breath sounds. No respiratory distress. No wheezes, rales or ronchi noted.    BMET    Component Value Date/Time   NA 137 12/14/2013 0922   K 3.5 12/14/2013 0922   CL 106 12/14/2013 0922   CO2 24 12/14/2013 0922   GLUCOSE 88 12/14/2013 0922   BUN 11 12/14/2013 0922   CREATININE 0.8 12/14/2013 0922   CALCIUM 8.5 12/14/2013 0922   GFRNONAA >60 01/17/2011 0900   GFRAA >60 01/17/2011 0900    Lipid Panel     Component Value Date/Time   CHOL 142 10/15/2012 0757   TRIG 32.0 10/15/2012 0757   HDL 55.70 10/15/2012 0757   CHOLHDL 3 10/15/2012 0757   VLDL 6.4 10/15/2012 0757   LDLCALC 80 10/15/2012 0757    CBC    Component Value  Date/Time   WBC 7.2 12/14/2013 0922   RBC 3.79* 12/14/2013 0922   HGB 7.8 Repeated and verified X2.* 12/14/2013 0922   HCT 25.1* 12/14/2013 0922   PLT 329.0 12/14/2013 0922   MCV 66.2* 12/14/2013 0922   MCH 22.9* 01/17/2011 0900   MCHC 31.0 12/14/2013 0922   RDW 20.7* 12/14/2013 0922   LYMPHSABS 2.1 12/14/2013 0922   MONOABS 0.4 12/14/2013 0922   EOSABS 0.1 12/14/2013 0922   BASOSABS 0.0 12/14/2013 0922    Hgb A1C No results found for this basename: HGBA1C        Assessment & Plan:

## 2014-05-03 NOTE — Patient Instructions (Addendum)
Peripheral Edema °You have swelling in your legs (peripheral edema). This swelling is due to excess accumulation of salt and water in your body. Edema may be a sign of heart, kidney or liver disease, or a side effect of a medication. It may also be due to problems in the leg veins. Elevating your legs and using special support stockings may be very helpful, if the cause of the swelling is due to poor venous circulation. Avoid long periods of standing, whatever the cause. °Treatment of edema depends on identifying the cause. Chips, pretzels, pickles and other salty foods should be avoided. Restricting salt in your diet is almost always needed. Water pills (diuretics) are often used to remove the excess salt and water from your body via urine. These medicines prevent the kidney from reabsorbing sodium. This increases urine flow. °Diuretic treatment may also result in lowering of potassium levels in your body. Potassium supplements may be needed if you have to use diuretics daily. Daily weights can help you keep track of your progress in clearing your edema. You should call your caregiver for follow up care as recommended. °SEEK IMMEDIATE MEDICAL CARE IF:  °· You have increased swelling, pain, redness, or heat in your legs. °· You develop shortness of breath, especially when lying down. °· You develop chest or abdominal pain, weakness, or fainting. °· You have a fever. °Document Released: 08/14/2004 Document Revised: 09/29/2011 Document Reviewed: 07/25/2009 °ExitCare® Patient Information ©2015 ExitCare[employer] LLC. This information is not intended to replace advice given to you by your health care provider. Make sure you discuss any questions you have with your health care provider. ° °

## 2014-05-03 NOTE — Progress Notes (Signed)
Pre visit review using our clinic review tool, if applicable. No additional management support is needed unless otherwise documented below in the visit note. 

## 2014-05-11 ENCOUNTER — Encounter: Payer: Self-pay | Admitting: Internal Medicine

## 2014-05-11 ENCOUNTER — Ambulatory Visit (INDEPENDENT_AMBULATORY_CARE_PROVIDER_SITE_OTHER): Payer: 59 | Admitting: Internal Medicine

## 2014-05-11 VITALS — BP 110/70 | HR 91 | Temp 98.7°F | Ht 67.5 in | Wt 267.5 lb

## 2014-05-11 DIAGNOSIS — M7989 Other specified soft tissue disorders: Secondary | ICD-10-CM

## 2014-05-11 DIAGNOSIS — R609 Edema, unspecified: Secondary | ICD-10-CM

## 2014-05-11 DIAGNOSIS — M25561 Pain in right knee: Secondary | ICD-10-CM

## 2014-05-11 DIAGNOSIS — R010 Benign and innocent cardiac murmurs: Secondary | ICD-10-CM

## 2014-05-11 DIAGNOSIS — I10 Essential (primary) hypertension: Secondary | ICD-10-CM

## 2014-05-11 DIAGNOSIS — R011 Cardiac murmur, unspecified: Secondary | ICD-10-CM

## 2014-05-11 DIAGNOSIS — R600 Localized edema: Secondary | ICD-10-CM

## 2014-05-11 MED ORDER — INTEGRA 62.5-62.5-40-3 MG PO CAPS: 1.0000 | ORAL_CAPSULE | Freq: Every day | ORAL | Status: DC

## 2014-05-11 NOTE — Progress Notes (Signed)
Pre visit review using our clinic review tool, if applicable. No additional management support is needed unless otherwise documented below in the visit note. 

## 2014-05-12 ENCOUNTER — Telehealth: Payer: Self-pay | Admitting: Internal Medicine

## 2014-05-12 ENCOUNTER — Encounter: Payer: Self-pay | Admitting: Internal Medicine

## 2014-05-12 DIAGNOSIS — E876 Hypokalemia: Secondary | ICD-10-CM

## 2014-05-12 LAB — HEPATIC FUNCTION PANEL
ALBUMIN: 3 g/dL — AB (ref 3.5–5.2)
ALT: 7 U/L (ref 0–35)
AST: 16 U/L (ref 0–37)
Alkaline Phosphatase: 64 U/L (ref 39–117)
Bilirubin, Direct: 0.1 mg/dL (ref 0.0–0.3)
Total Bilirubin: 0.7 mg/dL (ref 0.2–1.2)
Total Protein: 7.8 g/dL (ref 6.0–8.3)

## 2014-05-12 LAB — BASIC METABOLIC PANEL
BUN: 11 mg/dL (ref 6–23)
CHLORIDE: 106 meq/L (ref 96–112)
CO2: 22 meq/L (ref 19–32)
Calcium: 8.6 mg/dL (ref 8.4–10.5)
Creatinine, Ser: 1 mg/dL (ref 0.4–1.2)
GFR: 81.49 mL/min (ref >60.00)
Glucose, Bld: 104 mg/dL — ABNORMAL HIGH (ref 70–99)
Potassium: 3.4 mEq/L — ABNORMAL LOW (ref 3.5–5.1)
SODIUM: 135 meq/L (ref 135–145)

## 2014-05-12 NOTE — Telephone Encounter (Signed)
Pt notified of lab results via my chart.  Needs a non fasting lab in 10-14 days.  Please schedule and notify pt of appt date and time.    Dr Lorin PicketScott

## 2014-05-15 ENCOUNTER — Encounter: Payer: Self-pay | Admitting: Internal Medicine

## 2014-05-21 ENCOUNTER — Encounter: Payer: Self-pay | Admitting: Internal Medicine

## 2014-05-21 DIAGNOSIS — R011 Cardiac murmur, unspecified: Secondary | ICD-10-CM | POA: Insufficient documentation

## 2014-05-21 DIAGNOSIS — M25561 Pain in right knee: Secondary | ICD-10-CM | POA: Insufficient documentation

## 2014-05-21 NOTE — Assessment & Plan Note (Signed)
Persistent lower extremity swelling.  Better in the am.  Feel more related to venous insufficiency.  Discussed wearing compression hose.  Refer to vascular surgery for further evaluation.  Check liver panel and recheck metabolic panel.  Stop HCTZ.

## 2014-05-21 NOTE — Assessment & Plan Note (Signed)
Blood pressure doing well.  Follow.  See if can hold HCTZ.  Check metabolic panel.

## 2014-05-21 NOTE — Progress Notes (Signed)
Subjective:    Patient ID: Rachel FootmanKylie Wilson, female    DOB: 01/04/1976, 38 y.o.   MRN: [id number]  HPI HPI 38 year old female with past history of pseudotumor cerebri and hypertension who comes in today as a work in to discuss her lower extremity swelling.  She has been having problems with increased swelling.  [facility].  See their notes for details.  Last Seen by Nicki Reaperegina Baity.  See her note for details.  Was started on HCTZ.  Taking HCTZ qod.  Swelling is better, but still worsens intermittently.  States at work she stands a lot.  Worse at this time.  Works 12 hour shifts.  Is better in the am.  Right seems worse than left.  Also having some issues with her right knee.  Notices when takes the stairs.  Taking iron, but it causes her some GI issues.    Past Medical History  Diagnosis Date  . Hypertension   . Pseudotumor cerebri dx 2007  . GERD (gastroesophageal reflux disease)   . Anemia     Current Outpatient Prescriptions on File Prior to Visit  Medication Sig Dispense Refill  . hydrochlorothiazide (HYDRODIURIL) 25 MG tablet Take 1 tablet (25 mg total) by mouth daily. 30 tablet 2   No current facility-administered medications on file prior to visit.    Review of Systems Patient denies any headache, lightheadedness or dizziness.  No chest pain, tightness or palpatations.  No increased shortness of breath, cough or congestion.  No nausea or vomiting.  No abdominal pain or cramping.  No bowel change.  Having heavy periods.  Lower extremity swelling as outlined.  Better in the am. Right knee bothering her at times.   Has GI issues with her iron.  Change to integra.       Objective:   Physical Exam Filed Vitals:   05/11/14 1503  BP: 110/70  Pulse: 91  Temp: 98.7 F (37.1 C)   Blood pressure recheck:  82118/5372  38 year old female in no acute distress.   HEENT:  Nares- clear.  Oropharynx - without lesions. NECK:  Supple.  Nontender.  No audible  bruit.  HEART:  Appears to be regular. I/VI systolic murmur.   LUNGS:  No crackles or wheezing audible.  Respirations even and unlabored.  RADIAL PULSE:  Equal bilaterally.  ABDOMEN:  Soft, nontender.  Bowel sounds present and normal.  No audible abdominal bruit. EXTREMITIES:   Minimal pedal and lower extremity swelling.  DP pulses palpable and equal bilaterally.          Assessment & Plan:  INTOLERANCE TO IRON.  Change to integra.    HEALTH MAINTENANCE.  Needs a physical.  Schedule.    Problem List Items Addressed This Visit    Essential hypertension, benign    Blood pressure doing well.  Follow.  See if can hold HCTZ.  Check metabolic panel.       Leg swelling    Persistent lower extremity swelling.  Better in the am.  Feel more related to venous insufficiency.  Discussed wearing compression hose.  Refer to vascular surgery for further evaluation.  Check liver panel and recheck metabolic panel.  Stop HCTZ.       Right knee pain    Pursue vascular surgery evaluation.  Follow.       Undiagnosed cardiac murmurs    Murmur as outlined.  Check ECHO.        Other Visit  Diagnoses    Bilateral edema of lower extremity    -  Primary    Relevant Orders       Ambulatory referral to Vascular Surgery    Edema        Relevant Orders       Hepatic function panel (Completed)       Basic metabolic panel (Completed)       2D Echocardiogram without contrast    Murmur        Relevant Orders       2D Echocardiogram without contrast

## 2014-05-21 NOTE — Assessment & Plan Note (Signed)
Murmur as outlined.  Check ECHO.

## 2014-05-21 NOTE — Assessment & Plan Note (Signed)
Pursue vascular surgery evaluation.  Follow.

## 2014-05-25 ENCOUNTER — Other Ambulatory Visit: Payer: 59

## 2014-06-01 ENCOUNTER — Other Ambulatory Visit (INDEPENDENT_AMBULATORY_CARE_PROVIDER_SITE_OTHER): Payer: 59

## 2014-06-01 DIAGNOSIS — E876 Hypokalemia: Secondary | ICD-10-CM

## 2014-06-01 LAB — POTASSIUM: POTASSIUM: 3.1 meq/L — AB (ref 3.5–5.1)

## 2014-06-02 ENCOUNTER — Other Ambulatory Visit: Payer: Self-pay | Admitting: Internal Medicine

## 2014-06-02 DIAGNOSIS — E876 Hypokalemia: Secondary | ICD-10-CM

## 2014-06-02 NOTE — Progress Notes (Signed)
Order placed for potassium.   

## 2014-06-13 ENCOUNTER — Other Ambulatory Visit: Payer: 59

## 2014-06-20 ENCOUNTER — Other Ambulatory Visit: Payer: 59

## 2014-06-30 ENCOUNTER — Other Ambulatory Visit: Payer: Self-pay

## 2014-06-30 ENCOUNTER — Other Ambulatory Visit (INDEPENDENT_AMBULATORY_CARE_PROVIDER_SITE_OTHER): Payer: 59

## 2014-06-30 DIAGNOSIS — R609 Edema, unspecified: Secondary | ICD-10-CM

## 2014-06-30 DIAGNOSIS — R011 Cardiac murmur, unspecified: Secondary | ICD-10-CM

## 2014-06-30 DIAGNOSIS — R069 Unspecified abnormalities of breathing: Secondary | ICD-10-CM

## 2014-07-01 ENCOUNTER — Encounter: Payer: Self-pay | Admitting: Internal Medicine

## 2014-07-04 ENCOUNTER — Ambulatory Visit (INDEPENDENT_AMBULATORY_CARE_PROVIDER_SITE_OTHER): Payer: 59 | Admitting: Internal Medicine

## 2014-07-04 ENCOUNTER — Encounter: Payer: Self-pay | Admitting: Internal Medicine

## 2014-07-04 VITALS — BP 132/82 | HR 80 | Temp 98.7°F | Wt 266.0 lb

## 2014-07-04 DIAGNOSIS — B379 Candidiasis, unspecified: Secondary | ICD-10-CM

## 2014-07-04 DIAGNOSIS — T3695XA Adverse effect of unspecified systemic antibiotic, initial encounter: Secondary | ICD-10-CM

## 2014-07-04 DIAGNOSIS — A499 Bacterial infection, unspecified: Secondary | ICD-10-CM

## 2014-07-04 DIAGNOSIS — N760 Acute vaginitis: Secondary | ICD-10-CM

## 2014-07-04 DIAGNOSIS — B9689 Other specified bacterial agents as the cause of diseases classified elsewhere: Secondary | ICD-10-CM

## 2014-07-04 MED ORDER — METRONIDAZOLE 500 MG PO TABS: 500.0000 mg | ORAL_TABLET | Freq: Three times a day (TID) | ORAL | Status: DC

## 2014-07-04 MED ORDER — FLUCONAZOLE 150 MG PO TABS: 150.0000 mg | ORAL_TABLET | Freq: Once | ORAL | Status: DC

## 2014-07-04 NOTE — Patient Instructions (Signed)
Bacterial Vaginosis Bacterial vaginosis is a vaginal infection that occurs when the normal balance of bacteria in the vagina is disrupted. It results from an overgrowth of certain bacteria. This is the most common vaginal infection in women of childbearing age. Treatment is important to prevent complications, especially in pregnant women, as it can cause a premature delivery. CAUSES  Bacterial vaginosis is caused by an increase in harmful bacteria that are normally present in smaller amounts in the vagina. Several different kinds of bacteria can cause bacterial vaginosis. However, the reason that the condition develops is not fully understood. RISK FACTORS Certain activities or behaviors can put you at an increased risk of developing bacterial vaginosis, including:  Having a new sex partner or multiple sex partners.  Douching.  Using an intrauterine device (IUD) for contraception. Women do not get bacterial vaginosis from toilet seats, bedding, swimming pools, or contact with objects around them. SIGNS AND SYMPTOMS  Some women with bacterial vaginosis have no signs or symptoms. Common symptoms include:  Grey vaginal discharge.  A fishlike odor with discharge, especially after sexual intercourse.  Itching or burning of the vagina and vulva.  Burning or pain with urination. DIAGNOSIS  Your health care provider will take a medical history and examine the vagina for signs of bacterial vaginosis. A sample of vaginal fluid may be taken. Your health care provider will look at this sample under a microscope to check for bacteria and abnormal cells. A vaginal pH test may also be done.  TREATMENT  Bacterial vaginosis may be treated with antibiotic medicines. These may be given in the form of a pill or a vaginal cream. A second round of antibiotics may be prescribed if the condition comes back after treatment.  HOME CARE INSTRUCTIONS   Only take over-the-counter or prescription medicines as  directed by your health care provider.  If antibiotic medicine was prescribed, take it as directed. Make sure you finish it even if you start to feel better.  Do not have sex until treatment is completed.  Tell all sexual partners that you have a vaginal infection. They should see their health care provider and be treated if they have problems, such as a mild rash or itching.  Practice safe sex by using condoms and only having one sex partner. SEEK MEDICAL CARE IF:   Your symptoms are not improving after 3 days of treatment.  You have increased discharge or pain.  You have a fever. MAKE SURE YOU:   Understand these instructions.  Will watch your condition.  Will get help right away if you are not doing well or get worse. FOR MORE INFORMATION  Centers for Disease Control and Prevention, Division of STD Prevention: www.cdc.gov/std American Sexual Health Association (ASHA): www.ashastd.org  Document Released: 07/07/2005 Document Revised: 04/27/2013 Document Reviewed: 02/16/2013 ExitCare Patient Information 2015 [employer]. This information is not intended to replace advice given to you by your health care provider. Make sure you discuss any questions you have with your health care provider.  

## 2014-07-04 NOTE — Progress Notes (Signed)
Subjective:    Patient ID: Rachel LinemanKylie E Wilson, female    DOB: 12-Apr-1976, 38 y.o.   MRN: [id number]  HPI  Pt presents to the clinic today with c/o vaginal discharge. She reports this started 1 week ago. The discharge is thin and white. She has noticed an odor. She denies any itching, burning or urinary symptoms. 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 She does not douche. She has not tried anything OTC. Her LMP was 06/13/14. She is sexually active.  Review of Systems      Past Medical History  Diagnosis Date  . Hypertension   . Pseudotumor cerebri dx 2007  . GERD (gastroesophageal reflux disease)   . Anemia     Current Outpatient Prescriptions  Medication Sig Dispense Refill  . Fe Fum-FePoly-Vit C-Vit B3 (INTEGRA) 62.5-62.5-40-3 MG CAPS Take 1 capsule by mouth daily. 30 capsule 3  . hydrochlorothiazide (HYDRODIURIL) 25 MG tablet Take 1 tablet (25 mg total) by mouth daily. 30 tablet 2   No current facility-administered medications for this visit.    Allergies  Allergen Reactions  . Tylenol [Acetaminophen] Hives    Family History  Problem Relation Age of Onset  . Hypertension Mother   . Hypercholesterolemia Mother   . Thyroid disease Mother   . Colon cancer Neg Hx   . Breast cancer Neg Hx     History   Social History  . Marital Status: Married    Spouse Name: N/A    Number of Children: 3  . Years of Education: N/A   Occupational History  .  [employer]   Social History Main Topics  . Smoking status: Never Smoker   . Smokeless tobacco: Never Used  . Alcohol Use: 0.0 oz/week    0 Not specified per week     Comment: occasional  . Drug Use: No  . Sexual Activity: Yes    Birth Control/ Protection: None   Other Topics Concern  . Not on file   Social History Narrative     Constitutional: Denies fever, malaise, fatigue, headache or abrupt weight changes.  Gastrointestinal: Denies abdominal pain, bloating, constipation, diarrhea or blood in the stool.  GU:  Pt reports vaginal discharge. Denies urgency, frequency, pain with urination, burning sensation, blood in urine, odor or discharge.  No other specific complaints in a complete review of systems (except as listed in HPI above).  Objective:   Physical Exam   BP 132/82 mmHg  Pulse 80  Temp(Src) 98.7 F (37.1 C) (Oral)  Wt 266 lb (120.657 kg)  SpO2 98%  LMP 06/13/2014 Wt Readings from Last 3 Encounters:  07/04/14 266 lb (120.657 kg)  05/11/14 267 lb 8 oz (121.337 kg)  05/03/14 266 lb (120.657 kg)    General: Appears her stated age, obese but well developed, well nourished in NAD. Cardiovascular: Normal rate and rhythm. S1,S2 noted.  No murmur, rubs or gallops noted.  Pulmonary/Chest: Normal effort and positive vesicular breath sounds. No respiratory distress. No wheezes, rales or ronchi noted.  Abdomen: Soft and nontender. Normal bowel sounds, no bruits noted. No distention or masses noted.  GU: Normal female anatomy. Small amount of thin, white discharge noted at vaginal opening. + odor.  BMET    Component Value Date/Time   NA 135 05/11/2014 1554   K 3.1* 06/01/2014 0832   CL 106 05/11/2014 1554   CO2 22 05/11/2014 1554   GLUCOSE 104* 05/11/2014 1554   BUN 11 05/11/2014 1554   CREATININE 1.0 05/11/2014  1554   CALCIUM 8.6 05/11/2014 1554   GFRNONAA >60 01/17/2011 0900   GFRAA >60 01/17/2011 0900    Lipid Panel     Component Value Date/Time   CHOL 142 10/15/2012 0757   TRIG 32.0 10/15/2012 0757   HDL 55.70 10/15/2012 0757   CHOLHDL 3 10/15/2012 0757   VLDL 6.4 10/15/2012 0757   LDLCALC 80 10/15/2012 0757    CBC    Component Value Date/Time   WBC 9.1 05/03/2014 1400   RBC 4.34 05/03/2014 1400   HGB 8.4 Repeated and verified X2.* 05/03/2014 1400   HCT 27.4* 05/03/2014 1400   PLT 343.0 05/03/2014 1400   MCV 63.0 Repeated and verified X2.* 05/03/2014 1400   MCH 22.9* 01/17/2011 0900   MCHC 30.6 05/03/2014 1400   RDW 21.2* 05/03/2014 1400   LYMPHSABS 2.1  12/14/2013 0922   MONOABS 0.4 12/14/2013 0922   EOSABS 0.1 12/14/2013 0922   BASOSABS 0.0 12/14/2013 0922    Hgb A1C No results found for: HGBA1C      Assessment & Plan:   Vaginal discharge:  Wet prep: + clue cells, no yeast or trich noted eRx for Flagyl 500 mg TID x 7 days No alcohol while using this medication eRx for Diflucan for antibiotic induced yeast infection  RTC as needed or if symptoms persist or worsen

## 2014-07-04 NOTE — Progress Notes (Signed)
Pre visit review using our clinic review tool, if applicable. No additional management support is needed unless otherwise documented below in the visit note. 

## 2014-07-26 ENCOUNTER — Telehealth: Payer: Self-pay | Admitting: *Deleted

## 2014-07-26 ENCOUNTER — Ambulatory Visit (INDEPENDENT_AMBULATORY_CARE_PROVIDER_SITE_OTHER): Payer: 59 | Admitting: Internal Medicine

## 2014-07-26 ENCOUNTER — Encounter: Payer: Self-pay | Admitting: Internal Medicine

## 2014-07-26 ENCOUNTER — Other Ambulatory Visit: Payer: Self-pay | Admitting: Internal Medicine

## 2014-07-26 VITALS — BP 130/80 | HR 81 | Temp 98.0°F | Ht 67.5 in | Wt 267.5 lb

## 2014-07-26 DIAGNOSIS — M7989 Other specified soft tissue disorders: Secondary | ICD-10-CM

## 2014-07-26 DIAGNOSIS — I10 Essential (primary) hypertension: Secondary | ICD-10-CM

## 2014-07-26 DIAGNOSIS — K219 Gastro-esophageal reflux disease without esophagitis: Secondary | ICD-10-CM

## 2014-07-26 DIAGNOSIS — N920 Excessive and frequent menstruation with regular cycle: Secondary | ICD-10-CM

## 2014-07-26 DIAGNOSIS — G932 Benign intracranial hypertension: Secondary | ICD-10-CM

## 2014-07-26 DIAGNOSIS — R011 Cardiac murmur, unspecified: Secondary | ICD-10-CM

## 2014-07-26 DIAGNOSIS — E876 Hypokalemia: Secondary | ICD-10-CM

## 2014-07-26 DIAGNOSIS — D649 Anemia, unspecified: Secondary | ICD-10-CM

## 2014-07-26 DIAGNOSIS — R010 Benign and innocent cardiac murmurs: Secondary | ICD-10-CM

## 2014-07-26 LAB — CBC WITH DIFFERENTIAL/PLATELET
BASOS ABS: 0 10*3/uL (ref 0.0–0.1)
Basophils Relative: 0.2 % (ref 0.0–3.0)
Eosinophils Absolute: 0.1 10*3/uL (ref 0.0–0.7)
Eosinophils Relative: 1.3 % (ref 0.0–5.0)
HCT: 24 % — ABNORMAL LOW (ref 36.0–46.0)
LYMPHS PCT: 23.9 % (ref 12.0–46.0)
Lymphs Abs: 2.1 10*3/uL (ref 0.7–4.0)
MCHC: 30.5 g/dL (ref 30.0–36.0)
Monocytes Absolute: 0.5 10*3/uL (ref 0.1–1.0)
Monocytes Relative: 5.6 % (ref 3.0–12.0)
NEUTROS ABS: 6.1 10*3/uL (ref 1.4–7.7)
Neutrophils Relative %: 69 % (ref 43.0–77.0)
Platelets: 313 10*3/uL (ref 150.0–400.0)
RBC: 3.91 Mil/uL (ref 3.87–5.11)
RDW: 21.9 % — ABNORMAL HIGH (ref 11.5–15.5)
WBC: 8.8 10*3/uL (ref 4.0–10.5)

## 2014-07-26 LAB — BASIC METABOLIC PANEL
BUN: 14 mg/dL (ref 6–23)
CALCIUM: 8.8 mg/dL (ref 8.4–10.5)
CHLORIDE: 108 meq/L (ref 96–112)
CO2: 25 meq/L (ref 19–32)
Creatinine, Ser: 0.9 mg/dL (ref 0.4–1.2)
GFR: 97.06 mL/min (ref >60.00)
Glucose, Bld: 103 mg/dL — ABNORMAL HIGH (ref 70–99)
POTASSIUM: 3.3 meq/L — AB (ref 3.5–5.1)
SODIUM: 137 meq/L (ref 135–145)

## 2014-07-26 LAB — FERRITIN: FERRITIN: 3.5 ng/mL — AB (ref 10.0–291.0)

## 2014-07-26 MED ORDER — TANDEM PLUS 162-115.2-1 MG PO CAPS: 1.0000 | ORAL_CAPSULE | Freq: Every day | ORAL | Status: DC

## 2014-07-26 NOTE — Progress Notes (Signed)
Pre visit review using our clinic review tool, if applicable. No additional management support is needed unless otherwise documented below in the visit note. 

## 2014-07-26 NOTE — Progress Notes (Signed)
Orders placed for f/u labs.  

## 2014-07-26 NOTE — Progress Notes (Signed)
Subjective:    Patient ID: Rachel Salas, female    DOB: 03/17/1976, 39 y.o.   MRN: [id number]  HPI 39 year old female with past history of pseudotumor cerebri and hypertension who comes in today for a scheduled follow up.  She had been having problems with increased swelling.   Was referred to vascular surgery for evaluation.  Wearing support hose.  planning for vascular intervention.  See their note for details.  Swelling is better.  States at work she stands a lot.  Works 12 hour shifts.  She is not taking her iron.  hgb has been low.  We discussed the need to take iron supplements regularly.  Heavy menstrual period.  Floods.     Past Medical History  Diagnosis Date  . Hypertension   . Pseudotumor cerebri dx 2007  . GERD (gastroesophageal reflux disease)   . Anemia     Outpatient Encounter Prescriptions as of 07/26/2014  Medication Sig  . FeFum-FePo-FA-B Cmp-C-Zn-Mn-Cu (TANDEM PLUS) 162-115.2-1 MG CAPS Take 1 tablet by mouth daily.  . [DISCONTINUED] fluconazole (DIFLUCAN) 150 MG tablet Take 1 tablet (150 mg total) by mouth once.  . [DISCONTINUED] metroNIDAZOLE (FLAGYL) 500 MG tablet Take 1 tablet (500 mg total) by mouth 3 (three) times daily.    Review of Systems Patient denies any headache, lightheadedness or dizziness.  No chest pain, tightness or palpitations.  No increased shortness of breath, cough or congestion.  No nausea or vomiting.  No abdominal pain or cramping.  No bowel change.  Having heavy periods.  Lower extremity swelling as outlined.  Better.   Has GI issues with iron.  Tried to change to EchoStarintegra.  States she is not going to pay 30$ for the integra.  Will try tandem.  Stress importance of taking her iron regularly.       Objective:   Physical Exam  Filed Vitals:   07/26/14 0901  BP: 130/80  Pulse: 81  Temp: 98 F (5336.67 C)   39 year old female in no acute distress.   HEENT:  Nares- clear.  Oropharynx - without lesions. NECK:  Supple.  Nontender.  No audible  bruit.  HEART:  Appears to be regular. I/VI systolic murmur.   LUNGS:  No crackles or wheezing audible.  Respirations even and unlabored.  RADIAL PULSE:  Equal bilaterally.  ABDOMEN:  Soft, nontender.  Bowel sounds present and normal.  No audible abdominal bruit. EXTREMITIES:   Minimal pedal and lower extremity swelling.  DP pulses palpable and equal bilaterally.          Assessment & Plan:  1. Anemia, unspecified anemia type Low hgb.  Iron deficient.  Unable to tolerate some iron.  Tried to change to EchoStarintegra.  States she is not going to pay 30$ for integra.  Will see if tandem plus is cheaper.  Explained need to take iron supplements.  Recheck today.  Refer to gyn for heavy periods, especially in light of significant decrease in hgb.   - CBC with Differential - Ferritin  2. Hypokalemia Off hctz.  Recheck.   - Basic metabolic panel  3. Severe obesity (BMI >= 40) Diet and exercise.    4. Essential hypertension, benign Blood pressure doing well on no medication.  Follow.    5. Pseudotumor cerebri See neurology.    6. Gastroesophageal reflux disease, esophagitis presence not specified Controlled.  No problems reported.    7. Leg swelling Better.  Compression hose.  Seeing vascular surgery.  8. Undiagnosed cardiac murmurs Had ECHO. Normal.    9. Menorrhagia with regular cycle See above.  Refer to gyn for further evaluation and treatment, especially in light of significantly decreased hgb.    10. INTOLERANCE TO IRON.  Not willing to pay for integra.  Change to tandem plus and see if cheaper.  Follow.    HEALTH MAINTENANCE.  Needs a physical.  Schedule.

## 2014-07-26 NOTE — Telephone Encounter (Signed)
Noted.  Pt with history of anemia.  Non compliant with iron.  W/up in progress for heavy periods.  Pt instructed to take iron regularly

## 2014-07-26 NOTE — Telephone Encounter (Signed)
Bronson CurbGil from the lab called to report Hemoglobin as 7.4.

## 2014-07-27 ENCOUNTER — Encounter: Payer: Self-pay | Admitting: *Deleted

## 2014-07-30 ENCOUNTER — Encounter: Payer: Self-pay | Admitting: Internal Medicine

## 2014-07-30 DIAGNOSIS — N920 Excessive and frequent menstruation with regular cycle: Secondary | ICD-10-CM | POA: Insufficient documentation

## 2014-07-30 DIAGNOSIS — E669 Obesity, unspecified: Secondary | ICD-10-CM | POA: Insufficient documentation

## 2014-07-30 DIAGNOSIS — Z8639 Personal history of other endocrine, nutritional and metabolic disease: Secondary | ICD-10-CM | POA: Insufficient documentation

## 2014-08-08 ENCOUNTER — Other Ambulatory Visit (INDEPENDENT_AMBULATORY_CARE_PROVIDER_SITE_OTHER): Payer: 59

## 2014-08-08 ENCOUNTER — Encounter: Payer: Self-pay | Admitting: Internal Medicine

## 2014-08-08 ENCOUNTER — Telehealth: Payer: Self-pay | Admitting: Internal Medicine

## 2014-08-08 DIAGNOSIS — E876 Hypokalemia: Secondary | ICD-10-CM

## 2014-08-08 DIAGNOSIS — D649 Anemia, unspecified: Secondary | ICD-10-CM

## 2014-08-08 DIAGNOSIS — D509 Iron deficiency anemia, unspecified: Secondary | ICD-10-CM

## 2014-08-08 LAB — CBC WITH DIFFERENTIAL/PLATELET
BASOS ABS: 0.1 10*3/uL (ref 0.0–0.1)
Basophils Relative: 1.2 % (ref 0.0–3.0)
EOS ABS: 0.1 10*3/uL (ref 0.0–0.7)
Eosinophils Relative: 1.6 % (ref 0.0–5.0)
HCT: 26.4 % — ABNORMAL LOW (ref 36.0–46.0)
Hemoglobin: 8.1 g/dL — ABNORMAL LOW (ref 12.0–15.0)
LYMPHS ABS: 2.5 10*3/uL (ref 0.7–4.0)
Lymphocytes Relative: 29 % (ref 12.0–46.0)
MCHC: 30.8 g/dL (ref 30.0–36.0)
MCV: 61.6 fl — ABNORMAL LOW (ref 78.0–100.0)
Monocytes Absolute: 0.5 10*3/uL (ref 0.1–1.0)
Monocytes Relative: 5.3 % (ref 3.0–12.0)
Neutro Abs: 5.4 10*3/uL (ref 1.4–7.7)
Neutrophils Relative %: 62.9 % (ref 43.0–77.0)
Platelets: 363 10*3/uL (ref 150.0–400.0)
RBC: 4.28 Mil/uL (ref 3.87–5.11)
RDW: 22 % — AB (ref 11.5–15.5)
WBC: 8.5 10*3/uL (ref 4.0–10.5)

## 2014-08-08 LAB — FERRITIN: Ferritin: 6.6 ng/mL — ABNORMAL LOW (ref 10.0–291.0)

## 2014-08-08 LAB — POTASSIUM: POTASSIUM: 3.7 meq/L (ref 3.5–5.1)

## 2014-08-08 NOTE — Telephone Encounter (Signed)
Pt needs a non fasting lab appt in 4-6 weeks.  Please schedule and contact her with an appt date and time.   Thanks

## 2014-08-18 ENCOUNTER — Telehealth: Payer: Self-pay | Admitting: Internal Medicine

## 2014-08-18 ENCOUNTER — Ambulatory Visit: Payer: Self-pay | Admitting: Nurse Practitioner

## 2014-08-18 NOTE — Telephone Encounter (Signed)
Spoke with pt, advised her of MDs message.  She verbalized understanding

## 2014-08-18 NOTE — Telephone Encounter (Signed)
Needs to have US of lower extremity to evaluate for DVT. [facility].

## 2014-08-18 NOTE — Telephone Encounter (Signed)
[facility] - [place] TELEPHONE ADVICE RECORD   [facility]    --------------------------------------------------------------------------------   Patient Name: Rachel FootmanKYLIE WILSON  Gender: Female  DOB: 10-21-1975   Age: [age] Y 6 M 19 D  Return Phone Number: [phone] (Primary)  Address:     City/State/Zip:  [place]     Client [facility]  Client [facility]  Physician Lorin PicketScott, [place]   Contact Type Call  Call Type Triage / Clinical       Relationship To Patient Self       Return Phone Number [phone]) [phone] (Primary)  Chief Complaint Unclassified Symptom  Initial Comment Caller states she may have a blood clot in her leg.        PreDisposition Call Doctor                 Nurse Assessment  Nurse: Laural BenesJohnson, RN, Dondra SpryGail Date/Time Lamount Cohen(Eastern Time): 08/18/2014 8:08:17 AM  Confirm and document reason for call. If symptomatic, describe symptoms. ---Jenel LucksKylie has a spot on inner side of calf of eft leg that is tender to touch; MD at work looked at it and thought it may be blood clot or cellulitis    Has the patient traveled out of the country within the last 30 days? ---No    Does the patient require triage? ---Yes    Related visit to physician within the last 2 weeks? ---No    Does the PT have any chronic conditions? (i.e. diabetes, asthma, etc.) ---No    Did the patient indicate they were pregnant? ---No           Guidelines          Guideline Title Affirmed Question Affirmed Notes Nurse Date/Time Lamount Cohen(Eastern Time)  Leg Pain [1] Thigh or calf pain AND [2] only 1 side AND [3] present > 1 hour    Liberty HandyJohnson, RN, Gail 08/18/2014 8:10:56 AM    Disp. Time Lamount Cohen(Eastern Time) Disposition Final User         08/18/2014 8:12:48 AM See Physician within 4 Hours (or PCP triage) Yes Laural BenesJohnson, RN, Suzi RootsGail            Caller Understands: Yes  Disagree/Comply: Comply       Care Advice Given  Per Guideline        SEE PHYSICIAN WITHIN 4 HOURS (or PCP triage): CARE ADVICE given per Leg Pain (Adult) guideline.

## 2014-08-23 ENCOUNTER — Ambulatory Visit (INDEPENDENT_AMBULATORY_CARE_PROVIDER_SITE_OTHER): Payer: 59 | Admitting: Family Medicine

## 2014-08-23 ENCOUNTER — Encounter: Payer: Self-pay | Admitting: Family Medicine

## 2014-08-23 VITALS — BP 128/80 | HR 77 | Temp 98.0°F | Ht 67.5 in | Wt 267.0 lb

## 2014-08-23 DIAGNOSIS — N898 Other specified noninflammatory disorders of vagina: Secondary | ICD-10-CM

## 2014-08-23 MED ORDER — CLINDAMYCIN HCL 300 MG PO CAPS: 300.0000 mg | ORAL_CAPSULE | Freq: Two times a day (BID) | ORAL | Status: DC

## 2014-08-23 MED ORDER — FLUCONAZOLE 150 MG PO TABS: 150.0000 mg | ORAL_TABLET | Freq: Once | ORAL | Status: DC

## 2014-08-23 MED ORDER — METRONIDAZOLE 0.75 % VA GEL: 1.0000 | VAGINAL | Status: DC

## 2014-08-23 NOTE — Progress Notes (Signed)
Pre-visit discussion using our clinic review tool. No additional management support is needed unless otherwise documented below in the visit note.  

## 2014-08-23 NOTE — Progress Notes (Signed)
   BP 128/80 mmHg  Pulse 77  Temp(Src) 98 F (36.7 C)  Ht 5' 7.5" (1.715 m)  Wt 267 lb (121.11 kg)  BMI 41.18 kg/m2  SpO2 98%  LMP 08/11/2014   CC: vag discharge  Subjective:    Patient ID: Rachel Salas, female    DOB: 1975-12-10, 39 y.o.   MRN: [id number]  HPI: Rachel Salas is a 39 y.o. female presenting on 08/23/2014 for Vaginal Discharge   39 yo patient of Dr. Roby LoftsScott's with h/o pseudotumor cerebri, h/o HTN, anemia and GERD presents today with recurrent vag discharge over past week. Thin white discharge. H/o recurrent bacterial vaginosis - last seen 11/2013 with BV. Again seen 06/2014 with BV. Both times treated with metronidazole. No abd pain, fevers/chills, nausea. Started taking probiotics 3 wks ago.   Prior on regimen of weekly gels [facility]ridge [facility] - which helped prevent recurrences.  No douching, stopped fragranced soaps, no bubble baths. No recent swimming, no hot tubs. No new products or new partners.  LMP 08/11/2014.  Flagyl tends to cause yeast infection  Relevant past medical, surgical, family and social history reviewed and updated as indicated. Interim medical history since our last visit reviewed. Allergies and medications reviewed and updated. Current Outpatient Prescriptions on File Prior to Visit  Medication Sig  . FeFum-FePo-FA-B Cmp-C-Zn-Mn-Cu (TANDEM PLUS) 162-115.2-1 MG CAPS Take 1 tablet by mouth daily. (Patient not taking: Reported on 08/23/2014)   No current facility-administered medications on file prior to visit.    Review of Systems Per HPI unless specifically indicated above     Objective:    BP 128/80 mmHg  Pulse 77  Temp(Src) 98 F (36.7 C)  Ht 5' 7.5" (1.715 m)  Wt 267 lb (121.11 kg)  BMI 41.18 kg/m2  SpO2 98%  LMP 08/11/2014  Wt Readings from Last 3 Encounters:  08/23/14 267 lb (121.11 kg)  07/26/14 267 lb 8 oz (121.337 kg)  07/04/14 266 lb (120.657 kg)    Physical Exam  Constitutional: She appears  well-developed and well-nourished. No distress.  obese  Genitourinary: Pelvic exam was performed with patient supine. There is no rash, tenderness or lesion on the right labia. There is no rash or tenderness on the left labia. No erythema in the vagina. Vaginal discharge (thin white discharge present with some odor) found.  Nursing note and vitals reviewed.      Assessment & Plan:   Problem List Items Addressed This Visit    Vaginal discharge - Primary    H/o recurrent BV, pt states this is 4th time in last year. If + wet prep, would be 3rd documented case since 11/2013. Will send wet prep to lab and treat with clindamycin 7d course. Provided with diflucan prn yeast infection. Will also start metrogel twice weekly - rec M and Th. Update if recurrent infection despite this. Ok to continue probiotic. Discussed other measures to prevent recurrent infection.      Relevant Orders   Wet prep, genital       Follow up plan: Return if symptoms worsen or fail to improve.

## 2014-08-23 NOTE — Patient Instructions (Addendum)
Treat BV with clindamycin 7d course.  May use diflucan if needed (sent to pharmacy). After treatment, start twice weekly metrogel (sent to pharmacy) for next 4 months to see if we can prevent recurrent infection.  If recurrent infection, return to see us.

## 2014-08-23 NOTE — Assessment & Plan Note (Signed)
H/o recurrent BV, pt states this is 4th time in last year. If + wet prep, would be 3rd documented case since 11/2013. Will send wet prep to lab and treat with clindamycin 7d course. Provided with diflucan prn yeast infection. Will also start metrogel twice weekly - rec M and Th. Update if recurrent infection despite this. Ok to continue probiotic. Discussed other measures to prevent recurrent infection.

## 2014-08-23 NOTE — Addendum Note (Signed)
Addended by: Alvina ChouWALSH, Akasha Melena J on: 08/23/2014 10:45 AM   Modules accepted: Orders

## 2014-08-24 ENCOUNTER — Other Ambulatory Visit: Payer: Self-pay | Admitting: Family Medicine

## 2014-08-24 LAB — WET PREP BY MOLECULAR PROBE
Candida species: NEGATIVE
GARDNERELLA VAGINALIS: POSITIVE — AB
Trichomonas vaginosis: POSITIVE — AB

## 2014-08-24 MED ORDER — METRONIDAZOLE 500 MG PO TABS: 500.0000 mg | ORAL_TABLET | Freq: Two times a day (BID) | ORAL | Status: DC

## 2014-08-30 ENCOUNTER — Ambulatory Visit (INDEPENDENT_AMBULATORY_CARE_PROVIDER_SITE_OTHER): Payer: 59 | Admitting: Nurse Practitioner

## 2014-08-30 ENCOUNTER — Encounter: Payer: Self-pay | Admitting: Nurse Practitioner

## 2014-08-30 VITALS — BP 122/82 | HR 77 | Temp 97.3°F | Resp 12 | Ht 67.5 in | Wt 267.4 lb

## 2014-08-30 DIAGNOSIS — Z202 Contact with and (suspected) exposure to infections with a predominantly sexual mode of transmission: Secondary | ICD-10-CM

## 2014-08-30 DIAGNOSIS — N898 Other specified noninflammatory disorders of vagina: Secondary | ICD-10-CM

## 2014-08-30 NOTE — Progress Notes (Signed)
   Subjective:    Patient ID: Rachel Salas, female    DOB: 1975/10/12, 39 y.o.   MRN: [id number]  HPI  Rachel Salas is a 39 yo female with a CC of STD and wanting testing.   1) [facility] and was [facility] for vaginal discharge, hx of recurrent BV, test came back for Trich. Flagyl for 7 days, partner was treated. Partner of patient is no longer in the picture she reports.  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  Respiratory: Negative for chest tightness, shortness of breath and wheezing.   Cardiovascular: Negative for chest pain, palpitations and leg swelling.  Gastrointestinal: Negative for nausea, vomiting, abdominal pain, diarrhea and abdominal distention.  Genitourinary: Positive for vaginal discharge. Negative for dysuria and dyspareunia.  Skin: Negative for rash.  Neurological: Negative for dizziness, weakness, numbness and headaches.  Psychiatric/Behavioral: The patient is not nervous/anxious.        Objective:   Physical Exam  Constitutional: She is oriented to person, place, and time. She appears well-developed and well-nourished. No distress.  BP 122/82 mmHg  Pulse 77  Temp(Src) 97.3 F (36.3 C) (Oral)  Resp 12  Ht 5' 7.5" (1.715 m)  Wt 267 lb 6.4 oz (121.292 kg)  BMI 41.24 kg/m2  SpO2 97%  LMP 08/11/2014   HENT:  Head: Normocephalic and atraumatic.  Right Ear: External ear normal.  Left Ear: External ear normal.  Eyes: Right eye exhibits no discharge. Left eye exhibits no discharge. No scleral icterus.  Genitourinary:  Deferred- pt already had testing  Neurological: She is alert and oriented to person, place, and time. No cranial nerve deficit. She exhibits normal muscle tone. Coordination normal.  Skin: Skin is warm and dry. No rash noted. She is not diaphoretic.  Psychiatric: She has a normal mood and affect. Her behavior is normal. Judgment and thought content  normal.        Assessment & Plan:

## 2014-08-30 NOTE — Assessment & Plan Note (Signed)
Pt is being treated currently for Trichomoniasis. She would like other STD testing as well due to partner's inconsistent history. Will obtain STD panel and GC/Chlamydia urine. 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 FU prn results .

## 2014-08-30 NOTE — Progress Notes (Signed)
Pre visit review using our clinic review tool, if applicable. No additional management support is needed unless otherwise documented below in the visit note. 

## 2014-08-30 NOTE — Patient Instructions (Signed)

## 2014-08-31 LAB — STD PANEL
HEP B S AG: NEGATIVE
HIV: NONREACTIVE

## 2014-08-31 LAB — GC/CHLAMYDIA PROBE AMP, URINE
Chlamydia, Swab/Urine, PCR: NEGATIVE
GC Probe Amp, Urine: NEGATIVE

## 2014-09-07 ENCOUNTER — Other Ambulatory Visit (INDEPENDENT_AMBULATORY_CARE_PROVIDER_SITE_OTHER): Payer: 59

## 2014-09-07 DIAGNOSIS — E876 Hypokalemia: Secondary | ICD-10-CM

## 2014-09-07 DIAGNOSIS — D509 Iron deficiency anemia, unspecified: Secondary | ICD-10-CM

## 2014-09-07 LAB — CBC WITH DIFFERENTIAL/PLATELET
Basophils Absolute: 0 10*3/uL (ref 0.0–0.1)
Basophils Relative: 0.5 % (ref 0.0–3.0)
EOS ABS: 0.1 10*3/uL (ref 0.0–0.7)
Eosinophils Relative: 1.1 % (ref 0.0–5.0)
HCT: 25.2 % — ABNORMAL LOW (ref 36.0–46.0)
Hemoglobin: 8.1 g/dL — ABNORMAL LOW (ref 12.0–15.0)
LYMPHS ABS: 2.3 10*3/uL (ref 0.7–4.0)
Lymphocytes Relative: 25.5 % (ref 12.0–46.0)
MCHC: 32.1 g/dL (ref 30.0–36.0)
MONO ABS: 0.4 10*3/uL (ref 0.1–1.0)
MONOS PCT: 4.5 % (ref 3.0–12.0)
Neutro Abs: 6.1 10*3/uL (ref 1.4–7.7)
Neutrophils Relative %: 68.4 % (ref 43.0–77.0)
PLATELETS: 344 10*3/uL (ref 150.0–400.0)
RBC: 4 Mil/uL (ref 3.87–5.11)
RDW: 24.9 % — AB (ref 11.5–15.5)
WBC: 8.9 10*3/uL (ref 4.0–10.5)

## 2014-09-07 LAB — FERRITIN: FERRITIN: 6 ng/mL — AB (ref 10.0–291.0)

## 2014-09-07 LAB — POTASSIUM: POTASSIUM: 3.2 meq/L — AB (ref 3.5–5.1)

## 2014-09-08 ENCOUNTER — Other Ambulatory Visit: Payer: Self-pay | Admitting: Internal Medicine

## 2014-09-08 DIAGNOSIS — E876 Hypokalemia: Secondary | ICD-10-CM

## 2014-09-08 NOTE — Progress Notes (Signed)
Order placed for f/u potassium.  

## 2014-09-10 ENCOUNTER — Other Ambulatory Visit: Payer: Self-pay | Admitting: Internal Medicine

## 2014-09-10 DIAGNOSIS — D649 Anemia, unspecified: Secondary | ICD-10-CM

## 2014-09-10 NOTE — Progress Notes (Signed)
Order placed for referral to hematology.  

## 2014-09-13 ENCOUNTER — Encounter: Payer: Self-pay | Admitting: Internal Medicine

## 2014-09-13 DIAGNOSIS — B351 Tinea unguium: Secondary | ICD-10-CM

## 2014-09-14 NOTE — Telephone Encounter (Signed)
Order placed for podiatry referral.  Pt notified via my chart.  ?

## 2014-09-15 ENCOUNTER — Ambulatory Visit: Payer: Self-pay | Admitting: Internal Medicine

## 2014-09-18 ENCOUNTER — Other Ambulatory Visit (INDEPENDENT_AMBULATORY_CARE_PROVIDER_SITE_OTHER): Payer: 59

## 2014-09-18 DIAGNOSIS — E876 Hypokalemia: Secondary | ICD-10-CM

## 2014-09-18 LAB — POTASSIUM: POTASSIUM: 3.4 meq/L — AB (ref 3.5–5.1)

## 2014-09-19 ENCOUNTER — Ambulatory Visit
Admit: 2014-09-19 | Disposition: A | Discharge status: Home / Self Care | Payer: Self-pay | Attending: Internal Medicine | Admitting: Internal Medicine

## 2014-09-19 ENCOUNTER — Other Ambulatory Visit: Payer: Self-pay | Admitting: Internal Medicine

## 2014-09-19 ENCOUNTER — Encounter: Payer: Self-pay | Admitting: *Deleted

## 2014-09-19 DIAGNOSIS — E876 Hypokalemia: Secondary | ICD-10-CM

## 2014-09-19 NOTE — Progress Notes (Signed)
Order placed for f/u potassium.  

## 2014-09-21 ENCOUNTER — Ambulatory Visit (INDEPENDENT_AMBULATORY_CARE_PROVIDER_SITE_OTHER): Payer: 59 | Admitting: Podiatry

## 2014-09-21 ENCOUNTER — Encounter: Payer: Self-pay | Admitting: Podiatry

## 2014-09-21 VITALS — BP 133/87 | HR 84 | Resp 16 | Ht 68.0 in | Wt 266.0 lb

## 2014-09-21 DIAGNOSIS — L608 Other nail disorders: Secondary | ICD-10-CM

## 2014-09-21 DIAGNOSIS — L603 Nail dystrophy: Secondary | ICD-10-CM

## 2014-09-21 NOTE — Progress Notes (Signed)
   Subjective:    Patient ID: Rachel LinemanKylie E Salas, female    DOB: 14-Jun-1976, 39 y.o.   MRN: [id number]  HPI Comments: 39 year old a female presents the office today with complaints of discoloration to her right big toenails. States that she has noticed some white discoloration along the toenail particularly the corners. She states this been ongoing for 1+ years. She has tried multiple over-the-counter treatments without any resolution. She states that she has also discuss this with her primary care physician. She's had no prescription medication. She denies any pain associated with the nails or any redness or drainage. No other complaints at this time.     Review of Systems  All other systems reviewed and are negative.      Objective:   Physical Exam AAO x3, NAD DP/PT pulses palpable bilaterally, CRT less than 3 seconds Protective sensation intact with Simms Weinstein monofilament, vibratory sensation intact, Achilles tendon reflex intact Bilateral hallux nails have a small amount of white discoloration particularly within the lateral nail borders. There is also white discoloration to the fifth digit toenail bilaterally. Remaining nails without pathology. There is no tenderness palpation along the nails there is no swelling erythema or drainage. No areas of tenderness to bilateral lower extremities. No overlying edema, erythema, increase in warmth to bilateral lower extremities. MMT 5/5, ROM WNL.  No open lesions or pre-ulcerative lesions.  No pain with calf compression, swelling, warmth, erythema bilaterally.       Assessment & Plan:  39 year old female with possible onychomycosis to bilateral hallux, fifth digit toenails. -Treatment options were discussed with the patient include alternatives, risks, complications. At this time discussed various treatment options. Before proceeding with any treatment, the nails were biopsied and [facility]. Discussed if there is not  evidence of onychomycosis, can try Nuvail -Follow-up of the nail biopsy results are obtained. In the meantime, encouraged to call the office with any questions, concerns, change in symptoms.

## 2014-09-21 NOTE — Patient Instructions (Signed)

## 2014-09-29 ENCOUNTER — Encounter: Payer: Self-pay | Admitting: Internal Medicine

## 2014-09-29 ENCOUNTER — Ambulatory Visit (INDEPENDENT_AMBULATORY_CARE_PROVIDER_SITE_OTHER): Payer: 59 | Admitting: Internal Medicine

## 2014-09-29 VITALS — BP 120/80 | HR 74 | Temp 98.8°F | Ht 67.25 in | Wt 264.5 lb

## 2014-09-29 DIAGNOSIS — Z0000 Encounter for general adult medical examination without abnormal findings: Secondary | ICD-10-CM

## 2014-09-29 DIAGNOSIS — R21 Rash and other nonspecific skin eruption: Secondary | ICD-10-CM | POA: Diagnosis not present

## 2014-09-29 DIAGNOSIS — I10 Essential (primary) hypertension: Secondary | ICD-10-CM | POA: Diagnosis not present

## 2014-09-29 DIAGNOSIS — E876 Hypokalemia: Secondary | ICD-10-CM

## 2014-09-29 DIAGNOSIS — N920 Excessive and frequent menstruation with regular cycle: Secondary | ICD-10-CM

## 2014-09-29 DIAGNOSIS — M7989 Other specified soft tissue disorders: Secondary | ICD-10-CM

## 2014-09-29 DIAGNOSIS — K219 Gastro-esophageal reflux disease without esophagitis: Secondary | ICD-10-CM

## 2014-09-29 DIAGNOSIS — G932 Benign intracranial hypertension: Secondary | ICD-10-CM

## 2014-09-29 DIAGNOSIS — D649 Anemia, unspecified: Secondary | ICD-10-CM

## 2014-09-29 DIAGNOSIS — K5900 Constipation, unspecified: Secondary | ICD-10-CM

## 2014-09-29 LAB — BASIC METABOLIC PANEL
BUN: 14 mg/dL (ref 6–23)
CO2: 24 mEq/L (ref 19–32)
Calcium: 8.7 mg/dL (ref 8.4–10.5)
Chloride: 106 mEq/L (ref 96–112)
Creatinine, Ser: 0.85 mg/dL (ref 0.40–1.20)
GFR: 96.96 mL/min (ref >60.00)
Glucose, Bld: 90 mg/dL (ref 70–99)
Potassium: 3.6 mEq/L (ref 3.5–5.1)
Sodium: 135 mEq/L (ref 135–145)

## 2014-09-29 LAB — POTASSIUM: POTASSIUM: 3.6 meq/L (ref 3.5–5.1)

## 2014-09-29 LAB — SEDIMENTATION RATE: Sed Rate: 49 mm/hr — ABNORMAL HIGH (ref 0–22)

## 2014-09-29 MED ORDER — NYSTATIN 100000 UNIT/GM EX CREA: 1.0000 "application " | TOPICAL_CREAM | Freq: Two times a day (BID) | CUTANEOUS | Status: DC

## 2014-09-29 NOTE — Progress Notes (Signed)
Patient ID: Rachel Salas, female   DOB: [birth year], 39 y.o.   MRN: [id number]   Subjective:    Patient ID: Rachel Salas, female    DOB: [birth year], 39 y.o.   MRN: [id number]  HPI  Patient here for her physical exam.  She did see [facility] for leg swelling and aching.  Recommended compression hose and leg elevation.  Helping.  Periods still very heavy.  Receiving iron injections every two weeks.  Discussed referral to gyn.  Occasional hard stool.  She relates it to iron.  We discussed miralax.  No abdominal pain or cramping.  Has noticed some light headedness and dizziness recently.  Has a history of pseudotumor cerebri.     Past Medical History  Diagnosis Date  . Hypertension   . Pseudotumor cerebri dx 2007  . GERD (gastroesophageal reflux disease)   . Anemia     Review of Systems  Constitutional: Negative for appetite change and unexpected weight change.  HENT: Negative for congestion and sinus pressure.   Eyes: Negative for pain and visual disturbance.  Respiratory: Negative for cough, chest tightness and shortness of breath.   Cardiovascular: Positive for leg swelling (better with compression hose.  ). Negative for chest pain and palpitations.  Gastrointestinal: Positive for constipation (some hard stool.  ). Negative for nausea, vomiting, abdominal pain and diarrhea.  Genitourinary: Negative for frequency and difficulty urinating.  Musculoskeletal: Negative for back pain and joint swelling.  Skin: Negative for color change and wound.  Neurological: Positive for light-headedness (some occasional light headedness.  ). Negative for headaches.  Hematological: Negative for adenopathy. Does not bruise/bleed easily.  Psychiatric/Behavioral: Negative for dysphoric mood and decreased concentration.       Objective:    Physical Exam  Constitutional: She is oriented to person, place, and time. She appears well-developed and well-nourished.  HENT:  Nose: Nose normal.   Mouth/Throat: Oropharynx is clear and moist.  Eyes: Right eye exhibits no discharge. Left eye exhibits no discharge. No scleral icterus.  Neck: Neck supple. No thyromegaly present.  Cardiovascular: Normal rate and regular rhythm.   Pulmonary/Chest: Breath sounds normal. No accessory muscle usage. No tachypnea. No respiratory distress. She has no decreased breath sounds. She has no wheezes. She has no rhonchi. Right breast exhibits no inverted nipple, no mass, no nipple discharge and no tenderness (no axillary adenopathy). Left breast exhibits no inverted nipple, no mass, no nipple discharge and no tenderness (no axilarry adenopathy).  Abdominal: Soft. Bowel sounds are normal. There is no tenderness.  Musculoskeletal: She exhibits no edema or tenderness.  Lymphadenopathy:    She has no cervical adenopathy.  Neurological: She is alert and oriented to person, place, and time.  Skin: Skin is warm. No rash noted.  Psychiatric: She has a normal mood and affect. Her behavior is normal.    BP 120/80 mmHg  Pulse 74  Temp(Src) 98.8 F (37.1 C) (Oral)  Ht 5' 7.25" (1.708 m)  Wt 264 lb 8 oz (119.976 kg)  BMI 41.13 kg/m2  SpO2 99%  LMP 09/07/2014 (Exact Date) Wt Readings from Last 3 Encounters:  09/29/14 264 lb 8 oz (119.976 kg)  09/21/14 266 lb (120.657 kg)  08/30/14 267 lb 6.4 oz (121.292 kg)     Lab Results  Component Value Date   WBC 8.9 09/07/2014   HGB 8.1 Repeated and verified X2.* 09/07/2014   HCT 25.2 Repeated and verified X2.* 09/07/2014   PLT 344.0 09/07/2014   GLUCOSE 90 09/29/2014  CHOL 142 10/15/2012   TRIG 32.0 10/15/2012   HDL 55.70 10/15/2012   LDLCALC 80 10/15/2012   ALT 7 05/11/2014   AST 16 05/11/2014   NA 135 09/29/2014   K 3.6 09/29/2014   K 3.6 09/29/2014   CL 106 09/29/2014   CREATININE 0.85 09/29/2014   BUN 14 09/29/2014   CO2 24 09/29/2014   TSH 2.06 12/14/2013   MICROALBUR 1.2 12/14/2013       Assessment & Plan:   Problem List Items Addressed  This Visit    Anemia    Persistent iron deficient anemia.  Receiving iron injections now.  Refer to gyn for evaluation of her heavy periods.        Constipation    Stay hydrated.  Stay active.  Miralax.  Follow.        Essential hypertension, benign    On no medications.  Blood pressure doing well.  Follow.       GERD (gastroesophageal reflux disease)    No reflux now.  Follow.        Health care maintenance    Physical 09/29/14.  Will get her pap at gyn.  Will need mammogram.        Heavy periods    Refer to gyn for evaluation of the heavy periods.  Persistent anemia, requiring iron injections.  Follow.       Relevant Orders   Ambulatory referral to Gynecology   Hypokalemia - Primary    Last check wnl.  Follow potassium.        Relevant Orders   Basic metabolic panel (Completed)   Leg swelling    Seeing [facility].  Wearing compression hose.  Follow.        Pseudotumor cerebri    Has history pseudotumor cerebri.  Recent dizziness as outlined.  Is not being followed by neurology now.  Will refer back for evaluation.        Relevant Orders   Ambulatory referral to Neurology   Severe obesity (BMI >= 40)    Diet and exercise.         Other Visit Diagnoses    Rash        Relevant Orders    Sedimentation rate (Completed)      I spent 25 minutes with the patient and more than 50% of the time was spent in consultation regarding the above.     Dale Cairo, MD

## 2014-09-29 NOTE — Patient Instructions (Signed)
miralax daily   Colace 100mg  per day

## 2014-09-29 NOTE — Progress Notes (Signed)
Pre visit review using our clinic review tool, if applicable. No additional management support is needed unless otherwise documented below in the visit note. 

## 2014-10-01 ENCOUNTER — Encounter: Payer: Self-pay | Admitting: Internal Medicine

## 2014-10-07 ENCOUNTER — Telehealth: Payer: Self-pay | Admitting: Internal Medicine

## 2014-10-07 ENCOUNTER — Encounter: Payer: Self-pay | Admitting: Internal Medicine

## 2014-10-07 DIAGNOSIS — Z0000 Encounter for general adult medical examination without abnormal findings: Secondary | ICD-10-CM | POA: Insufficient documentation

## 2014-10-07 DIAGNOSIS — Z1239 Encounter for other screening for malignant neoplasm of breast: Secondary | ICD-10-CM

## 2014-10-07 NOTE — Assessment & Plan Note (Signed)
Persistent iron deficient anemia.  Receiving iron injections now.  Refer to gyn for evaluation of her heavy periods.

## 2014-10-07 NOTE — Telephone Encounter (Signed)
Has she ever had a mammogram?  If no, then see if pt agreeable for me to schedule a mammogram.  If so, let me know and let me know when she wants.

## 2014-10-07 NOTE — Assessment & Plan Note (Signed)
No reflux now.  Follow.

## 2014-10-07 NOTE — Assessment & Plan Note (Signed)
Has history pseudotumor cerebri.  Recent dizziness as outlined.  Is not being followed by neurology now.  Will refer back for evaluation.

## 2014-10-07 NOTE — Assessment & Plan Note (Signed)
On no medications.  Blood pressure doing well.  Follow.

## 2014-10-07 NOTE — Assessment & Plan Note (Signed)
Diet and exercise.   

## 2014-10-07 NOTE — Assessment & Plan Note (Signed)
Seeing [facility].  Wearing compression hose.  Follow.

## 2014-10-07 NOTE — Assessment & Plan Note (Signed)
Physical 09/29/14.  Will get her pap at gyn.  Will need mammogram.

## 2014-10-07 NOTE — Assessment & Plan Note (Signed)
Refer to gyn for evaluation of the heavy periods.  Persistent anemia, requiring iron injections.  Follow.

## 2014-10-07 NOTE — Assessment & Plan Note (Signed)
Last check wnl.  Follow potassium.

## 2014-10-07 NOTE — Assessment & Plan Note (Signed)
Stay hydrated.  Stay active.  Miralax.  Follow.

## 2014-10-09 NOTE — Telephone Encounter (Signed)
Left message for pt to return call.

## 2014-10-09 NOTE — Telephone Encounter (Signed)
Pt stated that she had never had a mammogram but & she is okay for us to schedule it for her. I mentioned [place] & she was okay with that location.

## 2014-10-10 ENCOUNTER — Encounter: Payer: Self-pay | Admitting: Neurology

## 2014-10-10 ENCOUNTER — Ambulatory Visit (INDEPENDENT_AMBULATORY_CARE_PROVIDER_SITE_OTHER): Payer: 59 | Admitting: Neurology

## 2014-10-10 VITALS — BP 129/84 | HR 74 | Temp 98.0°F | Resp 16 | Ht 67.5 in | Wt 263.0 lb

## 2014-10-10 DIAGNOSIS — R600 Localized edema: Secondary | ICD-10-CM | POA: Diagnosis not present

## 2014-10-10 DIAGNOSIS — G4719 Other hypersomnia: Secondary | ICD-10-CM

## 2014-10-10 DIAGNOSIS — R42 Dizziness and giddiness: Secondary | ICD-10-CM

## 2014-10-10 DIAGNOSIS — R0683 Snoring: Secondary | ICD-10-CM

## 2014-10-10 DIAGNOSIS — G932 Benign intracranial hypertension: Secondary | ICD-10-CM | POA: Diagnosis not present

## 2014-10-10 DIAGNOSIS — G4726 Circadian rhythm sleep disorder, shift work type: Secondary | ICD-10-CM

## 2014-10-10 DIAGNOSIS — R351 Nocturia: Secondary | ICD-10-CM

## 2014-10-10 NOTE — Progress Notes (Signed)
Subjective:    Patient ID: Rachel Salas is a 39 y.o. female.  HPI     Rachel FoleySaima Trequan Marsolek, MD, PhD [facility] [address], [address] [address] [place], [place] [address]  Dear Dr. Lorin PicketScott,  I saw your patient, Rachel Salas, upon your kind request in my neurologic clinic today for initial consultation of her diagnosis of pseudotumor cerebri and intermittent dizziness. The patient is unaccompanied today. As you know, Rachel Salas is a 39 year old right-handed woman with an underlying medical history of reflux disease, lower extremity edema, followed by vascular surgery, iron deficiency anemia, menorrhagia, hypertension and severe obesity, who was diagnosed with pseudotumor cerebri over 10 years ago. [facility] and had about 3 lumbar punctures, last one in perhaps 2006. 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 Her symptoms at the time were primarily visual obscurations. In the past week or 2 she has had intermittent dizziness. She does not report visual obscurations or blurry vision or recurrent headaches are one-sided neurological symptoms such as numbness, tingling, weakness or pain. She has even while sitting down intermittent brief lightheadedness, not described as vertigo. She has no nausea. She had an eye exam within the last year and was told that there was no swelling. Her contact lens prescription has not changed much. She currently does not have any dizziness or lightheadedness or vertigo symptoms. She has had swelling of her lower extremities. She has had an echocardiogram which was fine per her report. She has seen a vascular surgeon and is in the process of being scheduled for laser surgery to her right leg. She has significant sleep disruption. 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 She works from 7 PM to 7 AM. She does not sleep well. Sleep is disrupted. She has to get up to use the  bathroom multiple times. She does not typically wake up with a headache. She does not wake up rested and feels sleepy. She snores according to her husband. This is not loud and she has never been told that she has breathing pauses while asleep and denies waking up with a gasping sensation. She drinks caffeine 2-3 times per week. She drinks alcohol rarely. She is a nonsmoker. She has 3 children ages 8119, 5217 and 8913.   Her Past Medical History Is Significant For: Past Medical History  Diagnosis Date  . Hypertension   . Pseudotumor cerebri dx 2007  . GERD (gastroesophageal reflux disease)   . Anemia     Her Past Surgical History Is Significant For: Past Surgical History  Procedure Laterality Date  . Tonsillectomy  2007    Her Family History Is Significant For: Family History  Problem Relation Age of Onset  . Hypertension Mother   . Hypercholesterolemia Mother   . Thyroid disease Mother   . Colon cancer Neg Hx   . Breast cancer Neg Hx     Her Social History Is Significant For: History   Social History  . Marital Status: Married    Spouse Name: N/A  . Number of Children: 3  . Years of Education: BS   Occupational History  .  [employer]   Social History Main Topics  . Smoking status: Never Smoker   . Smokeless tobacco: Never Used  . Alcohol Use: 0.0 oz/week    0 Standard drinks or equivalent per week     Comment: Rare/Occasional  . Drug Use:  No  . Sexual Activity: Yes    Birth Control/ Protection: None   Other Topics Concern  . None   Social History Narrative   2-3 caffeine drinks a week     Her Allergies Are:  Allergies  Allergen Reactions  . Tylenol [Acetaminophen] Hives  :   Her Current Medications Are:  Outpatient Encounter Prescriptions as of 10/10/2014  Medication Sig  . [DISCONTINUED] nystatin cream (MYCOSTATIN) Apply 1 application topically 2 (two) times daily.  :   Review of Systems:  Out of a complete 14 point review of systems, all are  reviewed and negative with the exception of these symptoms as listed below:   Review of Systems  Cardiovascular: Positive for leg swelling.  Neurological:       Dizziness    Objective:  Neurologic Exam  Physical Exam Physical Examination:   Filed Vitals:   10/10/14 0821  BP: 129/84  Pulse: 74  Temp: 98 F (36.7 C)  Resp: 16   General Examination: The patient is a very pleasant 39 y.o. female in no acute distress. She appears well-developed and well-nourished and well groomed. She is obese. She has no lightheadedness or vertiginous symptoms upon standing or changing head position.  HEENT: Normocephalic, atraumatic, pupils are equal, round and reactive to light and accommodation. Funduscopic exam is normal with sharp disc margins noted. I did not detect any papilledema. Visual fields are full by finger perimetry.  Extraocular tracking is good without limitation to gaze excursion or nystagmus noted. Normal smooth pursuit is noted. Hearing is grossly intact. Tympanic membranes are clear bilaterally. Face is symmetric with normal facial animation and normal facial sensation. Speech is clear with no dysarthria noted. There is no hypophonia. There is no lip, neck/head, jaw or voice tremor. Neck is supple with full range of passive and active motion. There are no carotid bruits on auscultation. Oropharynx exam reveals: mild mouth dryness, good dental hygiene and mild airway crowding, due to narrow airway entry, thicker soft palate and mildly enlarged tongue, tonsils are absent, Mallampati is class I. Tongue protrudes centrally and palate elevates symmetrically. Neck size is 14 5/8 inches. She has a Mild overbite. Nasal inspection reveals no significant nasal mucosal bogginess or redness and no septal deviation.   Chest: Clear to auscultation without wheezing, rhonchi or crackles noted.  Heart: S1+S2+0, regular and normal without murmurs, rubs or gallops noted.   Abdomen: Soft, non-tender and  non-distended with normal bowel sounds appreciated on auscultation.  Extremities: There is 1+ pitting edema in the distal lower extremities bilaterally. Pedal pulses are intact.  Skin: Warm and dry without trophic changes noted. There are no prominent varicose veins.  Musculoskeletal: exam reveals no obvious joint deformities, tenderness or joint swelling or erythema.   Neurologically:  Mental status: The patient is awake, alert and oriented in all 4 spheres. Her immediate and remote memory, attention, language skills and fund of knowledge are appropriate. There is no evidence of aphasia, agnosia, apraxia or anomia. Speech is clear with normal prosody and enunciation. Thought process is linear. Mood is normal and affect is normal.  Cranial nerves II - XII are as described above under HEENT exam. In addition: shoulder shrug is normal with equal shoulder height noted. Motor exam: Normal bulk, strength and tone is noted. There is no drift, tremor or rebound. Romberg is negative. Reflexes are 2+ throughout. Babinski: Toes are flexor bilaterally. Fine motor skills and coordination: intact with normal finger taps, normal hand movements, normal rapid alternating patting,  normal foot taps and normal foot agility.  Cerebellar testing: No dysmetria or intention tremor on finger to nose testing. Heel to shin is unremarkable bilaterally. There is no truncal or gait ataxia.  Sensory exam: intact to light touch, pinprick, vibration, temperature sense in the upper and lower extremities.  Gait, station and balance: She stands easily. No veering to one side is noted. No leaning to one side is noted. Posture is age-appropriate and stance is narrow based. Gait shows normal stride length and normal pace. No problems turning are noted. She turns en bloc. Tandem walk is unremarkable.    Assessment and Plan:  In summary, Jamella Grayer Andrey Campanile is a very pleasant 39 y.o.-year old female with an underlying medical history of reflux  disease, lower extremity edema, followed by vascular surgery, iron deficiency anemia, menorrhagia, hypertension and severe obesity, who was diagnosed with pseudotumor cerebri over 10 years ago. She has had in the past week or 2 intermittent dizziness, described as lightheadedness. She is currently without symptoms. She has not had recurrence of visual obscurations and had an eye exam within the last year. She is advised that at this time I do not see any papilledema and in fact her neurological exam is nonfocal which is very reassuring. In the past she had an MRI brain as well. We will try to get records from [facility]. At this juncture, I do believe she is at risk for obstructive sleep apnea. This can cause fake symptoms such as dizziness, lightheadedness, nonrestorative sleep, and excessive somnolence. 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 Sleep apnea can cause edema, and nocturia as well, two symptoms that she definitely has. She also is reported to snore. As far as diagnostic testing I do not believe she needs a lumbar puncture at this time. Nevertheless, I have encouraged her to schedule a formal eye exam with her eye doctor to make sure that we have not missed any supple papilledema. She certainly does not have symptoms or any visual field defect on my exam. She does need a formal eye exam however. She is in agreement. We talked about sleep apnea quite a bit as well. She would be willing to try CPAP therapy. We talked about medical treatments, surgical interventions and non-pharmacological approaches. I explained in particular the risks and ramifications of untreated moderate to severe OSA, especially with respect to developing cardiovascular disease down the Road, including congestive heart failure, difficult to treat hypertension, cardiac arrhythmias, or stroke. Even type 2 diabetes has, in part, been linked to untreated OSA. Symptoms of untreated OSA include daytime sleepiness, memory  problems, mood irritability and mood disorder such as depression and anxiety, lack of energy, as well as recurrent headaches, especially morning headaches. We talked about trying to maintain a healthy lifestyle in general, as well as the importance of weight control. I encouraged the patient to eat healthy, exercise daily and keep well hydrated, to keep a scheduled bedtime and wake time routine, to not skip any meals and eat healthy snacks in between meals. I advised the patient not to drive when feeling sleepy. I recommended the following at this time: sleep study with potential positive airway pressure titration. (We will score hypopneas at 3% and split the sleep study into diagnostic and treatment portion, if the estimated. 2 hour AHI is >15/h).   I explained the sleep test procedure to the patient and also outlined possible surgical and non-surgical treatment options of OSA, including the use of a custom-made  dental device (which would require a referral to a specialist dentist or oral surgeon), upper airway surgical options, such as pillar implants, radiofrequency surgery, tongue base surgery, and UPPP (which would involve a referral to an ENT surgeon). Rarely, jaw surgery such as mandibular advancement may be considered.  I also explained the CPAP treatment option to the patient, who indicated that she would be willing to try CPAP if the need arises. I explained the importance of being compliant with PAP treatment, not only for insurance purposes but primarily to improve Her symptoms, and for the patient's long term health benefit, including to reduce Her cardiovascular risks. I answered all her questions today and the patient was in agreement. I would like to see her back after the sleep study is completed and encouraged her to call with any interim questions, concerns, problems or updates.   Thank you very much for allowing me to participate in the care of this nice patient. If I can be of any further  assistance to you please do not hesitate to call me at [phone].  Sincerely,   Rachel Foley, MD, PhD

## 2014-10-10 NOTE — Patient Instructions (Addendum)
Based on your symptoms and your exam I believe you are at risk for obstructive sleep apnea or OSA, and I think we should proceed with a sleep study to determine whether you do or do not have OSA and how severe it is. If you have more than mild OSA, I want you to consider treatment with CPAP. Please remember, the risks and ramifications of moderate to severe obstructive sleep apnea or OSA are: Cardiovascular disease, including congestive heart failure, stroke, difficult to control hypertension, arrhythmias, and even type 2 diabetes has been linked to untreated OSA. Sleep apnea causes disruption of sleep and sleep deprivation in most cases, which, in turn, can cause recurrent headaches, problems with memory, mood, concentration, focus, and vigilance. Most people with untreated sleep apnea report excessive daytime sleepiness, which can affect their ability to drive. Please do not drive if you feel sleepy.  I will see you back after your sleep study to go over the test results and where to go from there. We will call you after your sleep study and to set up an appointment at the time.   I do not detect any papilledema. Please schedule a formal eye exam to make sure there is no subtle swelling.   I will see you back after the sleep study.   Please drink plenty of fluid and change positions slowly. Your exam is normal.

## 2014-10-10 NOTE — Telephone Encounter (Signed)
Order placed for mammogram.

## 2014-10-12 LAB — CBC CANCER CENTER
BASOS ABS: 0 x10 3/mm (ref 0.0–0.1)
Basophil %: 0.6 %
EOS ABS: 0.1 x10 3/mm (ref 0.0–0.7)
EOS PCT: 2.2 %
HCT: 35.6 % (ref 35.0–47.0)
HGB: 11.1 g/dL — ABNORMAL LOW (ref 12.0–16.0)
LYMPHS ABS: 2.2 x10 3/mm (ref 1.0–3.6)
Lymphocyte %: 37.8 %
MCH: 21.5 pg — AB (ref 26.0–34.0)
MCHC: 31.1 g/dL — ABNORMAL LOW (ref 32.0–36.0)
MCV: 69 fL — AB (ref 80–100)
MONO ABS: 0.3 x10 3/mm (ref 0.2–0.9)
Monocyte %: 4.5 %
Neutrophil #: 3.2 x10 3/mm (ref 1.4–6.5)
Neutrophil %: 54.9 %
PLATELETS: 228 x10 3/mm (ref 150–440)
RBC: 5.14 10*6/uL (ref 3.80–5.20)
RDW: 28.5 % — AB (ref 11.5–14.5)
WBC: 5.8 x10 3/mm (ref 3.6–11.0)

## 2014-10-17 ENCOUNTER — Encounter: Payer: Self-pay | Admitting: Internal Medicine

## 2014-10-20 ENCOUNTER — Ambulatory Visit
Admit: 2014-10-20 | Disposition: A | Discharge status: Home / Self Care | Payer: Self-pay | Attending: Internal Medicine | Admitting: Internal Medicine

## 2014-10-25 ENCOUNTER — Other Ambulatory Visit: Payer: Self-pay | Admitting: *Deleted

## 2014-10-25 ENCOUNTER — Telehealth: Payer: Self-pay | Admitting: *Deleted

## 2014-10-25 MED ORDER — NUVAIL EX SOLN: 1.0000 "application " | Freq: Every day | CUTANEOUS | Status: DC

## 2014-10-25 NOTE — Telephone Encounter (Signed)
Patient called back-told her results were negative for fungus and explained to her about Nuvail. She would like to try this. Sent Rx into pharmacy. Advised to follow up as needed.

## 2014-10-25 NOTE — Telephone Encounter (Signed)
Called and left message for Rachel Salas to call us back, discussed fungal results with dr Ardelle Antonwagoner, he stated negative for fungas , can try nuvail if she wants

## 2014-11-15 DIAGNOSIS — N8003 Adenomyosis of the uterus: Secondary | ICD-10-CM

## 2014-11-15 DIAGNOSIS — N800 Endometriosis of uterus: Secondary | ICD-10-CM

## 2014-11-15 HISTORY — DX: Adenomyosis of the uterus: N80.03

## 2014-11-15 HISTORY — DX: Endometriosis of uterus: N80.0

## 2014-11-30 ENCOUNTER — Ambulatory Visit (INDEPENDENT_AMBULATORY_CARE_PROVIDER_SITE_OTHER): Payer: 59 | Admitting: Neurology

## 2014-11-30 DIAGNOSIS — G478 Other sleep disorders: Secondary | ICD-10-CM | POA: Diagnosis not present

## 2014-11-30 DIAGNOSIS — R0683 Snoring: Secondary | ICD-10-CM

## 2014-11-30 DIAGNOSIS — G4720 Circadian rhythm sleep disorder, unspecified type: Secondary | ICD-10-CM

## 2014-11-30 NOTE — Sleep Study (Signed)
Please see the scanned sleep study interpretation located in the Procedure tab within the Chart Review section. 

## 2014-12-12 ENCOUNTER — Telehealth: Payer: Self-pay | Admitting: Neurology

## 2014-12-12 NOTE — Telephone Encounter (Signed)
Patient seen on 10/10/14. PSG on 11/30/14.   Please call and notify the patient that the recent sleep study did not show any significant obstructive sleep apnea. In fact, study showed overall benign findings, and mild to moderate snoring. For this trying to sleep off her back and weight loss may help. Please inform patient that at this point due to a normal exam and her benign history, we can play it by ear, if okay with her. I would be happy to see her back as needed. we will send copy of the sleep study results to PCP.  Once you have spoken to patient, you can close this encounter.   Thanks,  Huston FoleySaima Benjimin Hadden, MD, PhD [facility]

## 2014-12-13 NOTE — Telephone Encounter (Signed)
I spoke to patient and she is aware of results. States that she understands results and recommendation. She will call us if needed and will f/u with PCP.

## 2014-12-13 NOTE — Telephone Encounter (Signed)
Faxed to PCP

## 2014-12-19 ENCOUNTER — Encounter: Payer: Self-pay | Admitting: Internal Medicine

## 2014-12-19 DIAGNOSIS — R0683 Snoring: Secondary | ICD-10-CM | POA: Insufficient documentation

## 2014-12-21 ENCOUNTER — Encounter: Payer: Self-pay | Admitting: Obstetrics and Gynecology

## 2014-12-21 ENCOUNTER — Ambulatory Visit (INDEPENDENT_AMBULATORY_CARE_PROVIDER_SITE_OTHER): Payer: 59 | Admitting: Obstetrics and Gynecology

## 2014-12-21 VITALS — BP 135/91 | HR 87 | Ht 68.0 in | Wt 267.2 lb

## 2014-12-21 DIAGNOSIS — N888 Other specified noninflammatory disorders of cervix uteri: Secondary | ICD-10-CM

## 2014-12-21 DIAGNOSIS — Z30431 Encounter for routine checking of intrauterine contraceptive device: Secondary | ICD-10-CM

## 2014-12-21 DIAGNOSIS — N898 Other specified noninflammatory disorders of vagina: Secondary | ICD-10-CM | POA: Diagnosis not present

## 2014-12-21 NOTE — Progress Notes (Signed)
Subjective:     Patient ID: Rachel Salas, female   DOB: 11-19-1975, 39 y.o.   MRN: [id number]  HPIHere for IUD string check after insertion. Also notes increased cramping and discharge with last cycle.   Review of Systems  Constitutional: Negative.   HENT: Negative.   Gastrointestinal: Negative.   Genitourinary: Positive for vaginal discharge, menstrual problem and pelvic pain.  Skin: Negative.        Objective:   Physical Exam  Constitutional: She is oriented to person, place, and time. She appears well-developed and well-nourished.  HENT:  Head: Normocephalic and atraumatic.  Abdominal: Soft. There is no tenderness.  Genitourinary: Uterus normal. Vaginal discharge found.  IUD string 2.5 cm  Neurological: She is alert and oriented to person, place, and time.  Skin: Skin is warm and dry. She is not diaphoretic.  Psychiatric: She has a normal mood and affect.       Assessment:     1. Normal IUD string check 2. Cervical discharge - purulent     Plan:     1.GC/CT NAAT 2.Menstrual calendar monitoring 3.RTC 3 months for recheck. 4. NAAT results to be given to pt when available.   Scheryl MartenMarth Minka Knight, MD

## 2014-12-26 ENCOUNTER — Telehealth: Payer: Self-pay

## 2014-12-26 LAB — GC/CHLAMYDIA PROBE AMP
Chlamydia trachomatis, NAA: NEGATIVE
Neisseria gonorrhoeae by PCR: NEGATIVE

## 2014-12-26 NOTE — Telephone Encounter (Signed)
Pt aware. Pt still complains of d/c. Advised to make f/u appt if discharge persists.

## 2014-12-26 NOTE — Telephone Encounter (Signed)
-----   Message from Herold HarmsMartin A Defrancesco, MD sent at 12/26/2014  2:53 PM EDT ----- Please Notify - Labs normal

## 2014-12-26 NOTE — Telephone Encounter (Signed)
-----   Message from Martin A Defrancesco, MD sent at 12/26/2014  2:53 PM EDT ----- Please Notify - Labs normal 

## 2015-01-08 ENCOUNTER — Encounter: Payer: Self-pay | Admitting: Family Medicine

## 2015-01-08 ENCOUNTER — Telehealth: Payer: Self-pay | Admitting: Obstetrics and Gynecology

## 2015-01-08 DIAGNOSIS — B379 Candidiasis, unspecified: Secondary | ICD-10-CM

## 2015-01-08 NOTE — Telephone Encounter (Signed)
Pt has white d/c and itchy. Used atb for sinus infection last week. Had iud placed on 5/5. Having slight spotting. Advised monistat. Pt prefers diflucan d/t working nite shift. Pls advise. ty

## 2015-01-08 NOTE — Telephone Encounter (Signed)
PT tHINKS SHE Has a yeast inf, itchy, irritation, had iud paced on 5/5

## 2015-01-09 MED ORDER — FLUCONAZOLE 150 MG PO TABS: 150.0000 mg | ORAL_TABLET | Freq: Every day | ORAL | Status: DC

## 2015-01-09 NOTE — Telephone Encounter (Signed)
Pt aware. Med erx. If no better in a week pt will need to be seen.

## 2015-02-02 ENCOUNTER — Inpatient Hospital Stay: Payer: 59 | Attending: Internal Medicine

## 2015-02-02 ENCOUNTER — Other Ambulatory Visit: Payer: Self-pay

## 2015-02-02 DIAGNOSIS — D649 Anemia, unspecified: Secondary | ICD-10-CM

## 2015-02-26 LAB — IRON AND TIBC
IRON: 34 ug/dL — AB (ref 50–170)
Iron Bind.Cap.(Total): 310 ug/dL (ref 250–450)
Iron Saturation: 11 %
Unbound Iron-Bind.Cap.: 276 ug/dL

## 2015-02-26 LAB — FERRITIN: FERRITIN (ARMC): 136 ng/mL (ref 8–388)

## 2015-03-07 ENCOUNTER — Ambulatory Visit (INDEPENDENT_AMBULATORY_CARE_PROVIDER_SITE_OTHER): Payer: 59 | Admitting: Internal Medicine

## 2015-03-07 ENCOUNTER — Encounter: Payer: Self-pay | Admitting: Internal Medicine

## 2015-03-07 VITALS — BP 100/80 | HR 73 | Temp 98.6°F | Ht 68.0 in | Wt 268.4 lb

## 2015-03-07 DIAGNOSIS — I10 Essential (primary) hypertension: Secondary | ICD-10-CM | POA: Diagnosis not present

## 2015-03-07 DIAGNOSIS — D649 Anemia, unspecified: Secondary | ICD-10-CM | POA: Diagnosis not present

## 2015-03-07 DIAGNOSIS — G932 Benign intracranial hypertension: Secondary | ICD-10-CM | POA: Diagnosis not present

## 2015-03-07 DIAGNOSIS — Z13220 Encounter for screening for lipoid disorders: Secondary | ICD-10-CM

## 2015-03-07 DIAGNOSIS — N920 Excessive and frequent menstruation with regular cycle: Secondary | ICD-10-CM

## 2015-03-07 DIAGNOSIS — K219 Gastro-esophageal reflux disease without esophagitis: Secondary | ICD-10-CM | POA: Diagnosis not present

## 2015-03-07 DIAGNOSIS — E876 Hypokalemia: Secondary | ICD-10-CM

## 2015-03-07 NOTE — Progress Notes (Signed)
Pre visit review using our clinic review tool, if applicable. No additional management support is needed unless otherwise documented below in the visit note. 

## 2015-03-11 ENCOUNTER — Encounter: Payer: Self-pay | Admitting: Internal Medicine

## 2015-03-11 NOTE — Assessment & Plan Note (Signed)
Blood pressure doing well on no medication.  Follow pressures.  Follow metabolic panel.   

## 2015-03-11 NOTE — Assessment & Plan Note (Signed)
Diet and exercise.  Follow.  

## 2015-03-11 NOTE — Progress Notes (Signed)
Patient ID: Rachel Salas, female   DOB: 08-Apr-1976, 39 y.o.   MRN: [id number]   Subjective:    Patient ID: Rachel Salas, female    DOB: 1976/06/18, 39 y.o.   MRN: [id number]  HPI  Patient here for a scheduled follow up.  She saw gyn.  See their note for details.  States no further intervention warranted.  Periods are not as heavy, but are longer.  No longer taking oral iron and did not keep her f/u for iron infusion and hgb recheck.  Last iron infusion, hgb was 11.0.  Still with some fatigue.  No cardiac symptoms with increased activity or exertion.  No sob.  Eating and drinking well.  No nausea or vomiting.  Bowels stable.     Past Medical History  Diagnosis Date  . Hypertension   . Pseudotumor cerebri dx 2007  . GERD (gastroesophageal reflux disease)   . Anemia   . Menorrhagia with irregular cycle   . Adenomyosis 11/15/2014    u/s suspicious for adenomyosis  . Severe obesity   . Hypokalemia    Past Surgical History  Procedure Laterality Date  . Tonsillectomy  2007  . Tubal ligation  2002  . Tonsillectomy  2007   Family History  Problem Relation Age of Onset  . Hypertension Mother   . Hypercholesterolemia Mother   . Thyroid disease Mother   . Colon cancer Neg Hx   . Breast cancer Neg Hx   . Ovarian cancer Neg Hx   . Heart disease Neg Hx    Social History   Social History  . Marital Status: Married    Spouse Name: N/A  . Number of Children: 3  . Years of Education: BS   Occupational History  .  [employer]   Social History Main Topics  . Smoking status: Never Smoker   . Smokeless tobacco: Never Used  . Alcohol Use: 0.0 oz/week    0 Standard drinks or equivalent per week     Comment: Rare/Occasional  . Drug Use: No  . Sexual Activity: Yes    Birth Control/ Protection: None, Surgical   Other Topics Concern  . None   Social History Narrative   2-3 caffeine drinks a week     Outpatient Encounter Prescriptions as of 03/07/2015  Medication Sig  .  Dermatological Products, Misc. (NUVAIL) SOLN Apply 1 application topically daily.  Marland Kitchen levonorgestrel (MIRENA) 20 MCG/24HR IUD 1 each by Intrauterine route once.  . [DISCONTINUED] fluconazole (DIFLUCAN) 150 MG tablet Take 1 tablet (150 mg total) by mouth daily.   No facility-administered encounter medications on file as of 03/07/2015.    Review of Systems  Constitutional: Positive for fatigue. Negative for appetite change and unexpected weight change.  HENT: Negative for congestion and sinus pressure.   Respiratory: Negative for cough, chest tightness and shortness of breath.   Cardiovascular: Negative for chest pain, palpitations and leg swelling.  Gastrointestinal: Negative for nausea, vomiting, abdominal pain and diarrhea.  Genitourinary: Negative for dysuria and difficulty urinating.  Skin: Negative for color change and rash.  Neurological: Negative for dizziness, light-headedness and headaches.  Hematological: Does not bruise/bleed easily.  Psychiatric/Behavioral: Negative for dysphoric mood and agitation.       Objective:    Physical Exam  Constitutional: She appears well-developed and well-nourished. No distress.  HENT:  Nose: Nose normal.  Mouth/Throat: Oropharynx is clear and moist.  Eyes: Conjunctivae are normal. Right eye exhibits no discharge. Left eye exhibits no discharge.  Neck: Neck supple. No thyromegaly present.  Cardiovascular: Normal rate and regular rhythm.   Pulmonary/Chest: Breath sounds normal. No respiratory distress. She has no wheezes.  Abdominal: Soft. Bowel sounds are normal. There is no tenderness.  Musculoskeletal: She exhibits no edema or tenderness.  Lymphadenopathy:    She has no cervical adenopathy.  Skin: No rash noted. No erythema.  Psychiatric: She has a normal mood and affect. Her behavior is normal.    BP 100/80 mmHg  Pulse 73  Temp(Src) 98.6 F (37 C) (Oral)  Ht 5\' 8"  (1.727 m)  Wt 268 lb 6 oz (121.734 kg)  BMI 40.82 kg/m2  SpO2  97% Wt Readings from Last 3 Encounters:  03/07/15 268 lb 6 oz (121.734 kg)  12/21/14 267 lb 3.2 oz (121.201 kg)  10/10/14 263 lb (119.296 kg)     Lab Results  Component Value Date   WBC 5.8 10/11/2014   HGB 11.1* 10/11/2014   HCT 35.6 10/11/2014   PLT 228 10/11/2014   GLUCOSE 90 09/29/2014   CHOL 142 10/15/2012   TRIG 32.0 10/15/2012   HDL 55.70 10/15/2012   LDLCALC 80 10/15/2012   ALT 7 05/11/2014   AST 16 05/11/2014   NA 135 09/29/2014   K 3.6 09/29/2014   K 3.6 09/29/2014   CL 106 09/29/2014   CREATININE 0.85 09/29/2014   BUN 14 09/29/2014   CO2 24 09/29/2014   TSH 2.06 12/14/2013   MICROALBUR 1.2 12/14/2013       Assessment & Plan:   Problem List Items Addressed This Visit    Anemia    Was seeing hematology and receiving iron infusions.  hgb improved.  She did not f/u with hematology.  Discussed with her today.  Cost is an issue.  Recheck cbc and ferritin.        Relevant Orders   TSH   CBC with Differential/Platelet   Ferritin   Essential hypertension, benign - Primary    Blood pressure doing well on no medication.  Follow pressures.  Follow metabolic panel.        GERD (gastroesophageal reflux disease)    No reflux symptoms reported.  Follow.        Heavy periods    Saw gyn.  Refer to their note for details.  No further w/up or treatment felt warranted.  Follow cbc.        Hypokalemia    Recheck metabolic panel to confirm normal.        Relevant Orders   Comprehensive metabolic panel   Pseudotumor cerebri    Has documented history.  We had referred back to neurology.  Follow.  Currently without symptoms.        Severe obesity (BMI >= 40)    Diet and exercise.  Follow.         Other Visit Diagnoses    Screening cholesterol level        Relevant Orders    Lipid panel        Dale [place], MD

## 2015-03-11 NOTE — Assessment & Plan Note (Signed)
Has documented history.  We had referred back to neurology.  Follow.  Currently without symptoms.

## 2015-03-11 NOTE — Assessment & Plan Note (Signed)
No reflux symptoms reported.  Follow.   

## 2015-03-11 NOTE — Assessment & Plan Note (Signed)
Saw gyn.  Refer to their note for details.  No further w/up or treatment felt warranted.  Follow cbc.

## 2015-03-11 NOTE — Assessment & Plan Note (Signed)
Was seeing hematology and receiving iron infusions.  hgb improved.  She did not f/u with hematology.  Discussed with her today.  Cost is an issue.  Recheck cbc and ferritin.

## 2015-03-11 NOTE — Assessment & Plan Note (Signed)
Recheck metabolic panel to confirm normal.

## 2015-03-13 ENCOUNTER — Other Ambulatory Visit (INDEPENDENT_AMBULATORY_CARE_PROVIDER_SITE_OTHER): Payer: 59

## 2015-03-13 ENCOUNTER — Encounter: Payer: Self-pay | Admitting: Nurse Practitioner

## 2015-03-13 ENCOUNTER — Ambulatory Visit (INDEPENDENT_AMBULATORY_CARE_PROVIDER_SITE_OTHER): Payer: 59 | Admitting: Nurse Practitioner

## 2015-03-13 VITALS — BP 120/78 | HR 77 | Temp 98.3°F | Resp 14 | Ht 68.0 in | Wt 271.4 lb

## 2015-03-13 DIAGNOSIS — D649 Anemia, unspecified: Secondary | ICD-10-CM | POA: Diagnosis not present

## 2015-03-13 DIAGNOSIS — E876 Hypokalemia: Secondary | ICD-10-CM | POA: Diagnosis not present

## 2015-03-13 DIAGNOSIS — N898 Other specified noninflammatory disorders of vagina: Secondary | ICD-10-CM | POA: Diagnosis not present

## 2015-03-13 DIAGNOSIS — Z13220 Encounter for screening for lipoid disorders: Secondary | ICD-10-CM | POA: Diagnosis not present

## 2015-03-13 LAB — CBC WITH DIFFERENTIAL/PLATELET
BASOS PCT: 0.2 % (ref 0.0–3.0)
Basophils Absolute: 0 10*3/uL (ref 0.0–0.1)
EOS PCT: 1.5 % (ref 0.0–5.0)
Eosinophils Absolute: 0.2 10*3/uL (ref 0.0–0.7)
HEMATOCRIT: 32 % — AB (ref 36.0–46.0)
HEMOGLOBIN: 10.4 g/dL — AB (ref 12.0–15.0)
Lymphocytes Relative: 28 % (ref 12.0–46.0)
Lymphs Abs: 2.9 10*3/uL (ref 0.7–4.0)
MCHC: 32.5 g/dL (ref 30.0–36.0)
MCV: 77.7 fl — AB (ref 78.0–100.0)
MONO ABS: 0.5 10*3/uL (ref 0.1–1.0)
MONOS PCT: 4.9 % (ref 3.0–12.0)
Neutro Abs: 6.8 10*3/uL (ref 1.4–7.7)
Neutrophils Relative %: 65.4 % (ref 43.0–77.0)
Platelets: 268 10*3/uL (ref 150.0–400.0)
RBC: 4.12 Mil/uL (ref 3.87–5.11)
RDW: 16.3 % — AB (ref 11.5–15.5)
WBC: 10.4 10*3/uL (ref 4.0–10.5)

## 2015-03-13 LAB — TSH: TSH: 4.36 u[IU]/mL (ref 0.35–4.50)

## 2015-03-13 LAB — LIPID PANEL
CHOL/HDL RATIO: 3
Cholesterol: 138 mg/dL (ref 0–200)
HDL: 51.4 mg/dL (ref >39.00)
LDL CALC: 79 mg/dL (ref 0–99)
NONHDL: 86.49
Triglycerides: 39 mg/dL (ref 0.0–149.0)
VLDL: 7.8 mg/dL (ref 0.0–40.0)

## 2015-03-13 LAB — COMPREHENSIVE METABOLIC PANEL
ALBUMIN: 3.8 g/dL (ref 3.5–5.2)
ALK PHOS: 68 U/L (ref 39–117)
ALT: 7 U/L (ref 0–35)
AST: 11 U/L (ref 0–37)
BUN: 15 mg/dL (ref 6–23)
CHLORIDE: 106 meq/L (ref 96–112)
CO2: 25 mEq/L (ref 19–32)
Calcium: 8.6 mg/dL (ref 8.4–10.5)
Creatinine, Ser: 0.84 mg/dL (ref 0.40–1.20)
GFR: 98.05 mL/min (ref >60.00)
Glucose, Bld: 88 mg/dL (ref 70–99)
POTASSIUM: 3.4 meq/L — AB (ref 3.5–5.1)
SODIUM: 139 meq/L (ref 135–145)
Total Bilirubin: 0.5 mg/dL (ref 0.2–1.2)
Total Protein: 7 g/dL (ref 6.0–8.3)

## 2015-03-13 LAB — FERRITIN: Ferritin: 23.5 ng/mL (ref 10.0–291.0)

## 2015-03-13 NOTE — Patient Instructions (Signed)
We will let you know tomorrow your results.  Bacterial Vaginosis Bacterial vaginosis is a vaginal infection that occurs when the normal balance of bacteria in the vagina is disrupted. It results from an overgrowth of certain bacteria. This is the most common vaginal infection in women of childbearing age. Treatment is important to prevent complications, especially in pregnant women, as it can cause a premature delivery. CAUSES  Bacterial vaginosis is caused by an increase in harmful bacteria that are normally present in smaller amounts in the vagina. Several different kinds of bacteria can cause bacterial vaginosis. However, the reason that the condition develops is not fully understood. RISK FACTORS Certain activities or behaviors can put you at an increased risk of developing bacterial vaginosis, including:  Having a new sex partner or multiple sex partners.  Douching.  Using an intrauterine device (IUD) for contraception. Women do not get bacterial vaginosis from toilet seats, bedding, swimming pools, or contact with objects around them. SIGNS AND SYMPTOMS  Some women with bacterial vaginosis have no signs or symptoms. Common symptoms include:  Grey vaginal discharge.  A fishlike odor with discharge, especially after sexual intercourse.  Itching or burning of the vagina and vulva.  Burning or pain with urination. DIAGNOSIS  Your health care provider will take a medical history and examine the vagina for signs of bacterial vaginosis. A sample of vaginal fluid may be taken. Your health care provider will look at this sample under a microscope to check for bacteria and abnormal cells. A vaginal pH test may also be done.  TREATMENT  Bacterial vaginosis may be treated with antibiotic medicines. These may be given in the form of a pill or a vaginal cream. A second round of antibiotics may be prescribed if the condition comes back after treatment.  HOME CARE INSTRUCTIONS   Only take  over-the-counter or prescription medicines as directed by your health care provider.  If antibiotic medicine was prescribed, take it as directed. Make sure you finish it even if you start to feel better.  Do not have sex until treatment is completed.  Tell all sexual partners that you have a vaginal infection. They should see their health care provider and be treated if they have problems, such as a mild rash or itching.  Practice safe sex by using condoms and only having one sex partner. SEEK MEDICAL CARE IF:   Your symptoms are not improving after 3 days of treatment.  You have increased discharge or pain.  You have a fever. MAKE SURE YOU:   Understand these instructions.  Will watch your condition.  Will get help right away if you are not doing well or get worse. FOR MORE INFORMATION  Centers for Disease Control and Prevention, Division of STD Prevention: SolutionApps.[employer].za American Sexual Health Association (ASHA): www.ashastd.org  Document Released: 07/07/2005 Document Revised: 04/27/2013 Document Reviewed: 02/16/2013 [facility] Patient Information 2015 [place], [place]. This information is not intended to replace advice given to you by your health care provider. Make sure you discuss any questions you have with your health care provider.

## 2015-03-13 NOTE — Progress Notes (Signed)
Patient ID: Rachel Salas, female    DOB: April 30, 1976  Age: 39 y.o. MRN: [id number]  CC: Bacterial Vaginosis   HPI Rachel Salas presents for bacterial vaginosis symptoms.  1) Discharge- Metronidazole oral and gel in past   1 sexual partner- husband   Abstained for 2 months and just recently had sexual intercourse and symptoms began.  History Salley has a past medical history of Hypertension; Pseudotumor cerebri (dx 2007); GERD (gastroesophageal reflux disease); Anemia; Menorrhagia with irregular cycle; Adenomyosis (11/15/2014); Severe obesity; and Hypokalemia.   She has past surgical history that includes Tonsillectomy (2007); Tubal ligation (2002); and Tonsillectomy (2007).   Her family history includes Hypercholesterolemia in her mother; Hypertension in her mother; Thyroid disease in her mother. There is no history of Colon cancer, Breast cancer, Ovarian cancer, or Heart disease.She reports that she has never smoked. She has never used smokeless tobacco. She reports that she drinks alcohol. She reports that she does not use illicit drugs.  Outpatient Prescriptions Prior to Visit  Medication Sig Dispense Refill  . levonorgestrel (MIRENA) 20 MCG/24HR IUD 1 each by Intrauterine route once.    . Dermatological Products, Misc. (NUVAIL) SOLN Apply 1 application topically daily. (Patient not taking: Reported on 03/13/2015) 15 mL 11   No facility-administered medications prior to visit.    ROS Review of Systems  Constitutional: Negative for fever, chills, diaphoresis and fatigue.  Genitourinary: Positive for vaginal discharge. Negative for vaginal bleeding, vaginal pain and pelvic pain.    Objective:  BP 120/78 mmHg  Pulse 77  Temp(Src) 98.3 F (36.8 C)  Resp 14  Ht  (1.727 m)  Wt 271 lb 6.4 oz (123.106 kg)  BMI 41.28 kg/m2  SpO2 96%  Physical Exam  Constitutional: She is oriented to person, place, and time. She appears well-developed and well-nourished. No distress.  HENT:   Head: Normocephalic and atraumatic.  Right Ear: External ear normal.  Left Ear: External ear normal.  Genitourinary: Guaiac negative stool. Vaginal discharge found.  External genitalia normal appearance. Thin white copious discharge, no odor  Neurological: She is alert and oriented to person, place, and time. No cranial nerve deficit. She exhibits normal muscle tone. Coordination normal.  Skin: Skin is warm and dry. No rash noted. She is not diaphoretic.  Psychiatric: She has a normal mood and affect. Her behavior is normal. Judgment and thought content normal.   Assessment & Plan:   Virgen was seen today for bacterial vaginosis.  Diagnoses and all orders for this visit:  Vaginal discharge -     WET PREP BY MOLECULAR PROBE  I am having Ms. Salas maintain her NUVAIL and levonorgestrel.  No orders of the defined types were placed in this encounter.     Follow-up: No Follow-up on file.

## 2015-03-13 NOTE — Progress Notes (Signed)
Pre visit review using our clinic review tool, if applicable. No additional management support is needed unless otherwise documented below in the visit note. 

## 2015-03-13 NOTE — Assessment & Plan Note (Signed)
Patient has had a long history of bacterial vaginosis and recent trichomoniasis. Obtained wet prep today. We will inform patient of results. No treatment today.

## 2015-03-14 ENCOUNTER — Encounter: Payer: Self-pay | Admitting: Internal Medicine

## 2015-03-14 ENCOUNTER — Other Ambulatory Visit: Payer: Self-pay | Admitting: Nurse Practitioner

## 2015-03-14 LAB — WET PREP BY MOLECULAR PROBE
CANDIDA SPECIES: NEGATIVE
Gardnerella vaginalis: POSITIVE — AB
TRICHOMONAS VAG: NEGATIVE

## 2015-03-14 MED ORDER — CLINDAMYCIN HCL 300 MG PO CAPS: 300.0000 mg | ORAL_CAPSULE | Freq: Two times a day (BID) | ORAL | Status: DC

## 2015-03-19 ENCOUNTER — Ambulatory Visit (INDEPENDENT_AMBULATORY_CARE_PROVIDER_SITE_OTHER): Payer: 59 | Admitting: Internal Medicine

## 2015-03-19 ENCOUNTER — Encounter: Payer: Self-pay | Admitting: Internal Medicine

## 2015-03-19 VITALS — BP 118/90 | HR 73 | Temp 98.8°F | Ht 68.0 in | Wt 269.2 lb

## 2015-03-19 DIAGNOSIS — G932 Benign intracranial hypertension: Secondary | ICD-10-CM | POA: Diagnosis not present

## 2015-03-19 DIAGNOSIS — I10 Essential (primary) hypertension: Secondary | ICD-10-CM

## 2015-03-19 MED ORDER — TOPIRAMATE 25 MG PO TABS: ORAL_TABLET | ORAL | Status: DC

## 2015-03-19 NOTE — Progress Notes (Signed)
Patient ID: KAMEAH RAWL, female   DOB: 1975-08-20, 39 y.o.   MRN: [id number]   Subjective:    Patient ID: Rachel Salas, female    DOB: Jan 13, 1976, 39 y.o.   MRN: [id number]  HPI  Patient here as a work in with concerns regarding weight gain.  She has been adjusting her diet and exercising.  She is frustrated and wants medication to help her lose weight.  I did discuss her case with neurology.  We discussed trying topamax.  Discussed possible side effects of medication.  Otherwise doing ok.  Discussed importance of continuing diet adjustment and exercise.     Past Medical History  Diagnosis Date  . Hypertension   . Pseudotumor cerebri dx 2007  . GERD (gastroesophageal reflux disease)   . Anemia   . Menorrhagia with irregular cycle   . Adenomyosis 11/15/2014    u/s suspicious for adenomyosis  . Severe obesity   . Hypokalemia    Past Surgical History  Procedure Laterality Date  . Tonsillectomy  2007  . Tubal ligation  2002  . Tonsillectomy  2007   Family History  Problem Relation Age of Onset  . Hypertension Mother   . Hypercholesterolemia Mother   . Thyroid disease Mother   . Colon cancer Neg Hx   . Breast cancer Neg Hx   . Ovarian cancer Neg Hx   . Heart disease Neg Hx    Social History   Social History  . Marital Status: Married    Spouse Name: N/A  . Number of Children: 3  . Years of Education: BS   Occupational History  .  [employer]   Social History Main Topics  . Smoking status: Never Smoker   . Smokeless tobacco: Never Used  . Alcohol Use: 0.0 oz/week    0 Standard drinks or equivalent per week     Comment: Rare/Occasional  . Drug Use: No  . Sexual Activity: Yes    Birth Control/ Protection: None, Surgical   Other Topics Concern  . None   Social History Narrative   2-3 caffeine drinks a week      Review of Systems  Constitutional: Negative for fever and appetite change.       Discussed diet adjustment and exercise.    HENT: Negative for  congestion and sinus pressure.   Respiratory: Negative for cough, chest tightness and shortness of breath.   Cardiovascular: Negative for chest pain and palpitations.  Gastrointestinal: Negative for nausea, vomiting and diarrhea.  Neurological: Negative for dizziness and headaches.       Objective:     Blood pressure rechecked by me: 116/84  Physical Exam  Constitutional: She appears well-developed and well-nourished. No distress.  HENT:  Nose: Nose normal.  Mouth/Throat: Oropharynx is clear and moist.  Eyes: Conjunctivae are normal. Right eye exhibits no discharge. Left eye exhibits no discharge.  Neck: Neck supple. No thyromegaly present.  Cardiovascular: Normal rate and regular rhythm.   Pulmonary/Chest: Breath sounds normal. No respiratory distress. She has no wheezes.  Abdominal: Bowel sounds are normal.  Lymphadenopathy:    She has no cervical adenopathy.    BP 118/90 mmHg  Pulse 73  Temp(Src) 98.8 F (37.1 C) (Oral)  Ht  (1.727 m)  Wt 269 lb 4 oz (122.131 kg)  BMI 40.95 kg/m2  SpO2 98% Wt Readings from Last 3 Encounters:  03/19/15 269 lb 4 oz (122.131 kg)  03/13/15 271 lb 6.4 oz (123.106 kg)  03/07/15 268 lb  6 oz (121.734 kg)     Lab Results  Component Value Date   WBC 10.4 03/13/2015   HGB 10.4* 03/13/2015   HCT 32.0* 03/13/2015   PLT 268.0 03/13/2015   GLUCOSE 88 03/13/2015   CHOL 138 03/13/2015   TRIG 39.0 03/13/2015   HDL 51.40 03/13/2015   LDLCALC 79 03/13/2015   ALT 7 03/13/2015   AST 11 03/13/2015   NA 139 03/13/2015   K 3.4* 03/13/2015   CL 106 03/13/2015   CREATININE 0.84 03/13/2015   BUN 15 03/13/2015   CO2 25 03/13/2015   TSH 4.36 03/13/2015   MICROALBUR 1.2 12/14/2013       Assessment & Plan:   Problem List Items Addressed This Visit    Essential hypertension, benign - Primary    Blood pressure on recheck 116/84.  She will spot check her pressure.  Notify me if elevation.        Pseudotumor cerebri    No headache or  dizziness.  No light headedness.  Doing well.  Weight loss.        Severe obesity (BMI >= 40)    Weight loss discussed.  She is adjusting her diet and exercising.  Encouraged to continue.  Discussed options.  Start topamax 25mg  q day.  Can increased to bid if needed.  Follow.            Dale [place], MD

## 2015-03-19 NOTE — Progress Notes (Signed)
Pre-visit discussion using our clinic review tool. No additional management support is needed unless otherwise documented below in the visit note.  

## 2015-03-19 NOTE — Assessment & Plan Note (Signed)
Blood pressure on recheck 116/84.  She will spot check her pressure.  Notify me if elevation.

## 2015-03-19 NOTE — Assessment & Plan Note (Signed)
Weight loss discussed.  She is adjusting her diet and exercising.  Encouraged to continue.  Discussed options.  Start topamax  q day.  Can increased to bid if needed.  Follow.

## 2015-03-19 NOTE — Assessment & Plan Note (Signed)
No headache or dizziness.  No light headedness.  Doing well.  Weight loss.

## 2015-03-27 ENCOUNTER — Ambulatory Visit: Payer: 59 | Admitting: Obstetrics and Gynecology

## 2015-05-01 ENCOUNTER — Encounter: Payer: Self-pay | Admitting: Nurse Practitioner

## 2015-05-01 ENCOUNTER — Ambulatory Visit (INDEPENDENT_AMBULATORY_CARE_PROVIDER_SITE_OTHER): Payer: 59 | Admitting: Nurse Practitioner

## 2015-05-01 VITALS — BP 120/80 | HR 89 | Temp 98.7°F | Resp 14 | Ht 68.0 in | Wt 267.6 lb

## 2015-05-01 DIAGNOSIS — T7840XA Allergy, unspecified, initial encounter: Secondary | ICD-10-CM | POA: Diagnosis not present

## 2015-05-01 MED ORDER — PREDNISONE 10 MG PO TABS: ORAL_TABLET | ORAL | Status: DC

## 2015-05-01 NOTE — Patient Instructions (Addendum)
Prednisone with first meal of the "day"  6 tablets on day 1, 5 tablets on day 2, 4 tablets on day 3, 3 tablets on day 4, 2 tablets day 5, 1 tablet on day 6...done! Take all together- not separate.   Contact Dermatitis Dermatitis is redness, soreness, and swelling (inflammation) of the skin. Contact dermatitis is a reaction to certain substances that touch the skin. There are two types of contact dermatitis:   Irritant contact dermatitis. This type is caused by something that irritates your skin, such as dry Nest from washing them too much. This type does not require previous exposure to the substance for a reaction to occur. This type is more common.  Allergic contact dermatitis. This type is caused by a substance that you are allergic to, such as a nickel allergy or poison ivy. This type only occurs if you have been exposed to the substance (allergen) before. Upon a repeat exposure, your body reacts to the substance. This type is less common. CAUSES  Many different substances can cause contact dermatitis. Irritant contact dermatitis is most commonly caused by exposure to:   Makeup.   Soaps.   Detergents.   Bleaches.   Acids.   Metal salts, such as nickel.  Allergic contact dermatitis is most commonly caused by exposure to:   Poisonous plants.   Chemicals.   Jewelry.   Latex.   Medicines.   Preservatives in products, such as clothing.  RISK FACTORS This condition is more likely to develop in:   People who have jobs that expose them to irritants or allergens.  People who have certain medical conditions, such as asthma or eczema.  SYMPTOMS  Symptoms of this condition may occur anywhere on your body where the irritant has touched you or is touched by you. Symptoms include:  Dryness or flaking.   Redness.   Cracks.   Itching.   Pain or a burning feeling.   Blisters.  Drainage of small amounts of blood or clear fluid from skin cracks. With  allergic contact dermatitis, there may also be swelling in areas such as the eyelids, mouth, or genitals.  DIAGNOSIS  This condition is diagnosed with a medical history and physical exam. A patch skin test may be performed to help determine the cause. If the condition is related to your job, you may need to see an occupational medicine specialist. TREATMENT Treatment for this condition includes figuring out what caused the reaction and protecting your skin from further contact. Treatment may also include:   Steroid creams or ointments. Oral steroid medicines may be needed in more severe cases.  Antibiotics or antibacterial ointments, if a skin infection is present.  Antihistamine lotion or an antihistamine taken by mouth to ease itching.  A bandage (dressing). HOME CARE INSTRUCTIONS Skin Care  Moisturize your skin as needed.   Apply cool compresses to the affected areas.  Try taking a bath with:  Epsom salts. Follow the instructions on the packaging. You can get these at your local pharmacy or grocery store.  Baking soda. Pour a small amount into the bath as directed by your health care provider.  Colloidal oatmeal. Follow the instructions on the packaging. You can get this at your local pharmacy or grocery store.  Try applying baking soda paste to your skin. Stir water into baking soda until it reaches a paste-like consistency.  Do not scratch your skin.  Bathe less frequently, such as every other day.  Bathe in lukewarm water. Avoid using hot  water. Medicines  Take or apply over-the-counter and prescription medicines only as told by your health care provider.   If you were prescribed an antibiotic medicine, take or apply your antibiotic as told by your health care provider. Do not stop using the antibiotic even if your condition starts to improve. General Instructions  Keep all follow-up visits as told by your health care provider. This is important.  Avoid the  substance that caused your reaction. If you do not know what caused it, keep a journal to try to track what caused it. Write down:  What you eat.  What cosmetic products you use.  What you drink.  What you wear in the affected area. This includes jewelry.  If you were given a dressing, take care of it as told by your health care provider. This includes when to change and remove it. SEEK MEDICAL CARE IF:   Your condition does not improve with treatment.  Your condition gets worse.  You have signs of infection such as swelling, tenderness, redness, soreness, or warmth in the affected area.  You have a fever.  You have new symptoms. SEEK IMMEDIATE MEDICAL CARE IF:   You have a severe headache, neck pain, or neck stiffness.  You vomit.  You feel very sleepy.  You notice red streaks coming from the affected area.  Your bone or joint underneath the affected area becomes painful after the skin has healed.  The affected area turns darker.  You have difficulty breathing.   This information is not intended to replace advice given to you by your health care provider. Make sure you discuss any questions you have with your health care provider.   Document Released: 07/04/2000 Document Revised: 03/28/2015 Document Reviewed: 11/22/2014 Elsevier Interactive Patient Education [employer]

## 2015-05-01 NOTE — Progress Notes (Signed)
Pre visit review using our clinic review tool, if applicable. No additional management support is needed unless otherwise documented below in the visit note. 

## 2015-05-01 NOTE — Progress Notes (Signed)
Patient ID: Rachel Salas, female    DOB: [birth year]  Age: 39 y.o. MRN: [id number]  CC: Rash   HPI Rachel Salas presents for CC of rash 1 month  1) 1 month, worse at work, only on Rachel Salas and up to arms.  Itchy red and swells. Patient denies blistering but does report urticaria that pop up and go away.  Treatment to date: include Zantac, Benadryl, over-the-counter cortisone creams. These instructions were given to her by a doctor at her work.  History Rachel Salas has a past medical history of Hypertension; Pseudotumor cerebri (dx 2007); GERD (gastroesophageal reflux disease); Anemia; Menorrhagia with irregular cycle; Adenomyosis (11/15/2014); Severe obesity (HCC); and Hypokalemia.   She has past surgical history that includes Tonsillectomy (2007); Tubal ligation (2002); and Tonsillectomy (2007).   Her family history includes Hypercholesterolemia in her mother; Hypertension in her mother; Thyroid disease in her mother. There is no history of Colon cancer, Breast cancer, Ovarian cancer, or Heart disease.She reports that she has never smoked. She has never used smokeless tobacco. She reports that she drinks alcohol. She reports that she does not use illicit drugs.  Outpatient Prescriptions Prior to Visit  Medication Sig Dispense Refill  . levonorgestrel (MIRENA) 20 MCG/24HR IUD 1 each by Intrauterine route once.    . topiramate (TOPAMAX) 25 MG tablet Take one tablet one to two times per day 60 tablet 1  . Dermatological Products, Misc. (NUVAIL) SOLN Apply 1 application topically daily. (Patient not taking: Reported on 03/13/2015) 15 mL 11  . clindamycin (CLEOCIN) 300 MG capsule Take 1 capsule (300 mg total) by mouth 2 (two) times daily. 14 capsule 0   No facility-administered medications prior to visit.    ROS Review of Systems  Constitutional: Negative for fever, chills, diaphoresis and fatigue.  Respiratory: Negative for chest tightness, shortness of breath and wheezing.   Cardiovascular:  Negative for chest pain, palpitations and leg swelling.  Gastrointestinal: Negative for nausea, vomiting and diarrhea.  Skin: Positive for rash.  Neurological: Negative for dizziness, weakness, numbness and headaches.  Psychiatric/Behavioral: The patient is not nervous/anxious.     Objective:  BP 120/80 mmHg  Pulse 89  Temp(Src) 98.7 F (37.1 C) (Oral)  Resp 14  Ht  (1.727 m)  Wt 267 lb 9.6 oz (121.383 kg)  BMI 40.70 kg/m2  SpO2 98%  Physical Exam  Constitutional: She is oriented to person, place, and time. She appears well-developed and well-nourished. No distress.  HENT:  Head: Normocephalic and atraumatic.  Right Ear: External ear normal.  Left Ear: External ear normal.  Cardiovascular: Normal rate, regular rhythm and normal heart sounds.   Pulmonary/Chest: Effort normal and breath sounds normal. No respiratory distress. She has no wheezes. She has no rales. She exhibits no tenderness.  Musculoskeletal:       Twining: Generalized scattered erythematous areas on the Alan that do not extend up to the arms today. Patient has a photo of them extending up to the forearms. Patient also has a picture with the urticaria. Today they look flat but dry.  Neurological: She is alert and oriented to person, place, and time. No cranial nerve deficit. She exhibits normal muscle tone. Coordination normal.  Skin: Skin is warm and dry. No rash noted. She is not diaphoretic.  Psychiatric: She has a normal mood and affect. Her behavior is normal. Judgment and thought content normal.   Assessment & Plan:   Rachel Salas was seen today for rash.  Diagnoses and all orders for this visit:  Allergic reaction, initial encounter  Other orders -     predniSONE (DELTASONE) 10 MG tablet; Take 6 tablets by mouth on day 1 then decrease by 1 tablet each day until gone.  I have discontinued Ms. Rachel Salas's clindamycin. I am also having her start on predniSONE. Additionally, I am having her maintain her NUVAIL,  levonorgestrel, and topiramate.  Meds ordered this encounter  Medications  . predniSONE (DELTASONE) 10 MG tablet    Sig: Take 6 tablets by mouth on day 1 then decrease by 1 tablet each day until gone.    Dispense:  21 tablet    Refill:  0    Order Specific Question:  Supervising Provider    Answer:  Sherlene Shams [[id number]]     Follow-up: Return if symptoms worsen or fail to improve.

## 2015-05-04 DIAGNOSIS — T7840XA Allergy, unspecified, initial encounter: Secondary | ICD-10-CM | POA: Insufficient documentation

## 2015-05-04 NOTE — Assessment & Plan Note (Addendum)
Probable contact dermatitis from work-related items. Cannot rule out latex allergy or allergy to any of the hand sanitizer components. Patient will continue to pay attention at work to what is causing this. 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 Patient reports she only has this when she is working and when she has a few days off it disappears. Also the treatments are helpful for a short period of time (anti-histamines and steroids). No signs of anaphylaxis or angioedema. Will follow  Prednisone taper given to patient with instructions verbally and written

## 2015-05-11 ENCOUNTER — Encounter: Payer: Self-pay | Admitting: Internal Medicine

## 2015-05-16 ENCOUNTER — Telehealth: Payer: Self-pay | Admitting: Internal Medicine

## 2015-05-16 NOTE — Telephone Encounter (Signed)
Spoke to pt on the phone.  Discussed her multiple my chart messages and her phone call to our office.  I discussed her health issues.  I discussed her comments about not caring about her health.  She became angry on the phone.  Stated that I did not care about her health and did not want to help her lose weight.  I again reiterated to her that as we discussed at her office visit and through multiple messages, that I did care about her health and wanted to do what is best for her.  I did explain to her that she could let me know where to transfer her records.  She hung up on me.

## 2015-05-16 NOTE — Telephone Encounter (Signed)
Called pt on the phone.  See phone note.

## 2015-05-16 NOTE — Telephone Encounter (Signed)
Pt called about wanting to switch providers, pt wants to switch to [facility] Doss for these reasons:  -not feeling like Dr Lorin PicketScott is a caring doctor far as pt's health -always feels like it's a problem always giving other options -feels like DR Lorin PicketScott is not doing what she asks like a medication to help her and she states Dr Lorin PicketScott refuses to give it to her -pt states that she's doing what Dr Lorin PicketScott asks but not getting what she ask of DR Lorin PicketScott -pt states this is in regards to her weight loss  I did explain to the pt that Dr Lorin PicketScott is a very good doctor. And that if she needs to see Dr Lorin PicketScott we can send a message to her her seen. Or have her see another provider.   Thank you!

## 2015-05-25 ENCOUNTER — Inpatient Hospital Stay: Payer: 59 | Attending: Internal Medicine

## 2015-06-01 ENCOUNTER — Telehealth: Payer: Self-pay | Admitting: Internal Medicine

## 2015-06-01 NOTE — Telephone Encounter (Signed)
Patient called and is requesting to [facility] Patient has seen Rene Kocheregina before when she couldn't get in for an appointment at [place].  Is it ok for [place] to [place] to [place]?

## 2015-06-01 NOTE — Telephone Encounter (Signed)
I spoke to [place] and she decided not to see patient.  I notified patient Rene KocherRegina won't be seeing her.

## 2015-06-01 NOTE — Telephone Encounter (Signed)
Fine with me

## 2015-06-01 NOTE — Telephone Encounter (Signed)
This is the pt I spoke to you about.  I do not have a problem with Rachel Salas seeing her.

## 2015-06-08 ENCOUNTER — Ambulatory Visit: Payer: 59 | Admitting: Internal Medicine

## 2015-08-13 DIAGNOSIS — M79609 Pain in unspecified limb: Secondary | ICD-10-CM | POA: Diagnosis not present

## 2015-08-13 DIAGNOSIS — I890 Lymphedema, not elsewhere classified: Secondary | ICD-10-CM | POA: Diagnosis not present

## 2015-08-13 DIAGNOSIS — M7989 Other specified soft tissue disorders: Secondary | ICD-10-CM | POA: Diagnosis not present

## 2015-08-13 DIAGNOSIS — I8311 Varicose veins of right lower extremity with inflammation: Secondary | ICD-10-CM | POA: Diagnosis not present

## 2015-08-13 DIAGNOSIS — I8310 Varicose veins of unspecified lower extremity with inflammation: Secondary | ICD-10-CM | POA: Diagnosis not present

## 2015-08-13 DIAGNOSIS — I872 Venous insufficiency (chronic) (peripheral): Secondary | ICD-10-CM | POA: Diagnosis not present

## 2015-08-13 DIAGNOSIS — E669 Obesity, unspecified: Secondary | ICD-10-CM | POA: Diagnosis not present

## 2015-09-13 ENCOUNTER — Other Ambulatory Visit: Payer: Self-pay | Admitting: *Deleted

## 2015-09-13 ENCOUNTER — Encounter: Payer: Self-pay | Admitting: *Deleted

## 2015-09-13 DIAGNOSIS — D509 Iron deficiency anemia, unspecified: Secondary | ICD-10-CM

## 2015-09-14 ENCOUNTER — Inpatient Hospital Stay: Payer: 59

## 2015-09-14 ENCOUNTER — Inpatient Hospital Stay: Payer: 59 | Admitting: Internal Medicine

## 2015-09-14 ENCOUNTER — Ambulatory Visit: Payer: 59 | Admitting: Internal Medicine

## 2015-09-21 ENCOUNTER — Inpatient Hospital Stay: Payer: 59 | Admitting: Internal Medicine

## 2015-09-21 ENCOUNTER — Inpatient Hospital Stay: Payer: 59

## 2015-09-21 ENCOUNTER — Encounter: Payer: Self-pay | Admitting: *Deleted

## 2015-09-21 NOTE — Progress Notes (Signed)
No show for apt with Dr. Donneta RombergBrahmanday today. I sent [facility] scheduling a msg to have this apts rescheduled.

## 2015-10-26 ENCOUNTER — Ambulatory Visit: Payer: 59 | Admitting: Internal Medicine

## 2015-10-26 ENCOUNTER — Inpatient Hospital Stay: Payer: 59 | Admitting: Internal Medicine

## 2015-10-26 ENCOUNTER — Encounter: Payer: Self-pay | Admitting: *Deleted

## 2015-10-26 ENCOUNTER — Inpatient Hospital Stay: Payer: 59

## 2015-11-08 DIAGNOSIS — M79609 Pain in unspecified limb: Secondary | ICD-10-CM | POA: Diagnosis not present

## 2015-11-08 DIAGNOSIS — I8310 Varicose veins of unspecified lower extremity with inflammation: Secondary | ICD-10-CM | POA: Diagnosis not present

## 2015-11-08 DIAGNOSIS — M7989 Other specified soft tissue disorders: Secondary | ICD-10-CM | POA: Diagnosis not present

## 2015-11-08 DIAGNOSIS — I890 Lymphedema, not elsewhere classified: Secondary | ICD-10-CM | POA: Diagnosis not present

## 2015-11-08 DIAGNOSIS — I872 Venous insufficiency (chronic) (peripheral): Secondary | ICD-10-CM | POA: Diagnosis not present

## 2015-11-08 DIAGNOSIS — I8311 Varicose veins of right lower extremity with inflammation: Secondary | ICD-10-CM | POA: Diagnosis not present

## 2015-11-08 DIAGNOSIS — I8312 Varicose veins of left lower extremity with inflammation: Secondary | ICD-10-CM | POA: Diagnosis not present

## 2015-11-08 DIAGNOSIS — E669 Obesity, unspecified: Secondary | ICD-10-CM | POA: Diagnosis not present

## 2015-11-19 DIAGNOSIS — M79609 Pain in unspecified limb: Secondary | ICD-10-CM | POA: Diagnosis not present

## 2015-11-19 DIAGNOSIS — I8311 Varicose veins of right lower extremity with inflammation: Secondary | ICD-10-CM | POA: Diagnosis not present

## 2015-11-19 DIAGNOSIS — I872 Venous insufficiency (chronic) (peripheral): Secondary | ICD-10-CM | POA: Diagnosis not present

## 2015-11-29 DIAGNOSIS — I872 Venous insufficiency (chronic) (peripheral): Secondary | ICD-10-CM | POA: Diagnosis not present

## 2015-11-29 DIAGNOSIS — I8310 Varicose veins of unspecified lower extremity with inflammation: Secondary | ICD-10-CM | POA: Diagnosis not present

## 2015-11-29 DIAGNOSIS — I890 Lymphedema, not elsewhere classified: Secondary | ICD-10-CM | POA: Diagnosis not present

## 2015-11-29 DIAGNOSIS — E669 Obesity, unspecified: Secondary | ICD-10-CM | POA: Diagnosis not present

## 2015-11-29 DIAGNOSIS — I8312 Varicose veins of left lower extremity with inflammation: Secondary | ICD-10-CM | POA: Diagnosis not present

## 2015-11-29 DIAGNOSIS — I8311 Varicose veins of right lower extremity with inflammation: Secondary | ICD-10-CM | POA: Diagnosis not present

## 2015-11-29 DIAGNOSIS — M7989 Other specified soft tissue disorders: Secondary | ICD-10-CM | POA: Diagnosis not present

## 2015-11-29 DIAGNOSIS — M79609 Pain in unspecified limb: Secondary | ICD-10-CM | POA: Diagnosis not present

## 2015-12-18 DIAGNOSIS — H5213 Myopia, bilateral: Secondary | ICD-10-CM | POA: Diagnosis not present

## 2016-01-21 ENCOUNTER — Ambulatory Visit (INDEPENDENT_AMBULATORY_CARE_PROVIDER_SITE_OTHER): Payer: 59 | Admitting: Family Medicine

## 2016-01-21 ENCOUNTER — Encounter: Payer: Self-pay | Admitting: Family Medicine

## 2016-01-21 VITALS — BP 134/90 | HR 84 | Temp 98.0°F | Wt 274.0 lb

## 2016-01-21 DIAGNOSIS — N898 Other specified noninflammatory disorders of vagina: Secondary | ICD-10-CM | POA: Diagnosis not present

## 2016-01-21 MED ORDER — FLUCONAZOLE 150 MG PO TABS: 150.0000 mg | ORAL_TABLET | Freq: Once | ORAL | Status: DC

## 2016-01-21 NOTE — Progress Notes (Signed)
   BP 134/90 mmHg  Pulse 84  Temp(Src) 98 F (36.7 C) (Oral)  Wt 274 lb (124.286 kg)  LMP 12/29/2015   CC: vag discharge  Subjective:    Patient ID: Rachel Salas, female    DOB: 1976-03-22, 40 y.o.   MRN: [id number]  HPI: Rachel Salas is a 40 y.o. female presenting on 01/21/2016 for Vaginal Discharge   40 yo patient of Dr. Roby LoftsScott's with h/o pseudotumor cerebri, h/o HTN, anemia and GERD   1 wk h/o thick white vaginal discharge, pruritic. H/o recurrent BV, none recently. No recent yeast infection. Denies fevers/chills, abd pain, nausea, UTI sxs.   IUD in place over last 1+ yr. Noticing some spotting as well since last week. LMP 12/29/2015, very regular.   Separated from husband, but they have continued to have sex - requests STD screen.   Relevant past medical, surgical, family and social history reviewed and updated as indicated. Interim medical history since our last visit reviewed. Allergies and medications reviewed and updated. Current Outpatient Prescriptions on File Prior to Visit  Medication Sig  . levonorgestrel (MIRENA) 20 MCG/24HR IUD 1 each by Intrauterine route once.   No current facility-administered medications on file prior to visit.    Review of Systems Per HPI unless specifically indicated in ROS section     Objective:    BP 134/90 mmHg  Pulse 84  Temp(Src) 98 F (36.7 C) (Oral)  Wt 274 lb (124.286 kg)  LMP 12/29/2015  Wt Readings from Last 3 Encounters:  01/21/16 274 lb (124.286 kg)  05/01/15 267 lb 9.6 oz (121.383 kg)  03/19/15 269 lb 4 oz (122.131 kg)    Physical Exam  Constitutional: She appears well-developed and well-nourished. No distress.  Genitourinary: Pelvic exam was performed with patient supine. There is no rash, tenderness or lesion on the right labia. There is no rash, tenderness or lesion on the left labia. Vaginal discharge found.  Thick white discharge present. Wet prep performed.  Nursing note and vitals reviewed.  Results for  orders placed or performed in visit on 03/13/15  WET PREP BY MOLECULAR PROBE  Result Value Ref Range   Candida species NEG Negative   Trichomonas vaginosis NEG Negative   Gardnerella vaginalis POS (A) Negative      Assessment & Plan:   Problem List Items Addressed This Visit    Vaginal discharge - Primary    Story consistent with vaginal candidiasis - treat with diflucan 150mg  x1.  H/o BV and trich in the past. STD screen today. Wet prep sent today.       Relevant Orders   GC/Chlamydia Probe Amp   Wet prep, genital       Follow up plan: Return if symptoms worsen or fail to improve.  Eustaquio BoydenJavier Zoeya Gramajo, MD

## 2016-01-21 NOTE — Assessment & Plan Note (Signed)
Story consistent with vaginal candidiasis - treat with diflucan 150mg  x1.  H/o BV and trich in the past. STD screen today. Wet prep sent today.

## 2016-01-21 NOTE — Patient Instructions (Addendum)
Wet prep and chlamydia/gonorrheat test today  We will update you with results. In interim, start diflucan 150mg  x1 for yeast infection.  Monilial Vaginitis Vaginitis in a soreness, swelling and redness (inflammation) of the vagina and vulva. Monilial vaginitis is not a sexually transmitted infection. CAUSES  Yeast vaginitis is caused by yeast (candida) that is normally found in your vagina. With a yeast infection, the candida has overgrown in number to a point that upsets the chemical balance. SYMPTOMS   White, thick vaginal discharge.  Swelling, itching, redness and irritation of the vagina and possibly the lips of the vagina (vulva).  Burning or painful urination.  Painful intercourse. DIAGNOSIS  Things that may contribute to monilial vaginitis are:  Postmenopausal and virginal states.  Pregnancy.  Infections.  Being tired, sick or stressed, especially if you had monilial vaginitis in the past.  Diabetes. Good control will help lower the chance.  Birth control pills.  Tight fitting garments.  Using bubble bath, feminine sprays, douches or deodorant tampons.  Taking certain medications that kill germs (antibiotics).  Sporadic recurrence can occur if you become ill. TREATMENT  Your caregiver will give you medication.  There are several kinds of anti monilial vaginal creams and suppositories specific for monilial vaginitis. For recurrent yeast infections, use a suppository or cream in the vagina 2 times a week, or as directed.  Anti-monilial or steroid cream for the itching or irritation of the vulva may also be used. Get your caregiver's permission.  Painting the vagina with methylene blue solution may help if the monilial cream does not work.  Eating yogurt may help prevent monilial vaginitis. HOME CARE INSTRUCTIONS   Finish all medication as prescribed.  Do not have sex until treatment is completed or after your caregiver tells you it is okay.  Take warm sitz  baths.  Do not douche.  Do not use tampons, especially scented ones.  Wear cotton underwear.  Avoid tight pants and panty hose.  Tell your sexual partner that you have a yeast infection. They should go to their caregiver if they have symptoms such as mild rash or itching.  Your sexual partner should be treated as well if your infection is difficult to eliminate.  Practice safer sex. Use condoms.  Some vaginal medications cause latex condoms to fail. Vaginal medications that harm condoms are:  Cleocin cream.  Butoconazole (Femstat).  Terconazole (Terazol) vaginal suppository.  Miconazole (Monistat) (may be purchased over the counter). SEEK MEDICAL CARE IF:   You have a temperature by mouth above 102 F (38.9 C).  The infection is getting worse after 2 days of treatment.  The infection is not getting better after 3 days of treatment.  You develop blisters in or around your vagina.  You develop vaginal bleeding, and it is not your menstrual period.  You have pain when you urinate.  You develop intestinal problems.  You have pain with sexual intercourse.   This information is not intended to replace advice given to you by your health care provider. Make sure you discuss any questions you have with your health care provider.   Document Released: 04/16/2005 Document Revised: 09/29/2011 Document Reviewed: 01/08/2015 Elsevier Interactive Patient Education [employer]2016 Elsevier Inc.

## 2016-01-21 NOTE — Progress Notes (Signed)
Pre visit review using our clinic review tool, if applicable. No additional management support is needed unless otherwise documented below in the visit note. 

## 2016-01-21 NOTE — Addendum Note (Signed)
Addended by: Alvina ChouWALSH, TERRI J on: 01/21/2016 09:29 AM   Modules accepted: Orders

## 2016-01-22 LAB — WET PREP BY MOLECULAR PROBE
Candida species: NEGATIVE
GARDNERELLA VAGINALIS: POSITIVE — AB
TRICHOMONAS VAG: NEGATIVE

## 2016-01-23 LAB — GC/CHLAMYDIA PROBE AMP
CT Probe RNA: NOT DETECTED
GC PROBE AMP APTIMA: NOT DETECTED

## 2016-01-24 ENCOUNTER — Other Ambulatory Visit: Payer: Self-pay | Admitting: Family Medicine

## 2016-01-24 MED ORDER — METRONIDAZOLE 500 MG PO TABS: 500.0000 mg | ORAL_TABLET | Freq: Two times a day (BID) | ORAL | Status: DC

## 2016-04-07 ENCOUNTER — Encounter: Payer: Self-pay | Admitting: Family Medicine

## 2016-04-07 ENCOUNTER — Ambulatory Visit (INDEPENDENT_AMBULATORY_CARE_PROVIDER_SITE_OTHER): Payer: 59 | Admitting: Family Medicine

## 2016-04-07 VITALS — BP 128/80 | HR 95 | Temp 98.4°F | Ht 68.2 in | Wt 273.0 lb

## 2016-04-07 DIAGNOSIS — R51 Headache: Secondary | ICD-10-CM

## 2016-04-07 DIAGNOSIS — R519 Headache, unspecified: Secondary | ICD-10-CM | POA: Insufficient documentation

## 2016-04-07 DIAGNOSIS — G44209 Tension-type headache, unspecified, not intractable: Secondary | ICD-10-CM

## 2016-04-07 MED ORDER — CYCLOBENZAPRINE HCL 10 MG PO TABS: 10.0000 mg | ORAL_TABLET | Freq: Three times a day (TID) | ORAL | 0 refills | Status: DC | PRN

## 2016-04-07 MED ORDER — CYCLOBENZAPRINE HCL 10 MG PO TABS: 10.0000 mg | ORAL_TABLET | Freq: Every day | ORAL | 0 refills | Status: DC

## 2016-04-07 NOTE — Progress Notes (Signed)
BP 128/80   Pulse 95   Temp 98.4 F (36.9 C)   Ht 5' 8.2" (1.732 m)   Wt 273 lb (123.8 kg)   LMP 03/23/2016 (Exact Date)   SpO2 97%   BMI 41.27 kg/m    Subjective:    Patient ID: Rachel Salas, female    DOB: 23-Jul-1975, 40 y.o.   MRN: [id number]  HPI: Rachel Salas is a 40 y.o. female who presents today to establish care  Chief Complaint  Patient presents with  . Establish Care  . headaches    Patient has been having mild headaches over the last two weeks that is causing her to vomit.   Headaches Duration: 2 weeks- came on all of a sudden on the R side, goes into her neck Onset: sudden Severity: mild Quality: nagging Frequency: intermittent Location: R side of her head Headache duration: short, few minutes until meds kick in Radiation: yes into her neck Time of day headache occurs: at random Alleviating factors: aleve Aggravating factors: laying in bed Headache status at time of visit: asymptomatic Treatments attempted: aleve Aura: no Nausea:  yes Vomiting: no Photophobia:  yes Phonophobia:  no Effect on social functioning:  no Confusion:  no Gait disturbance/ataxia:  no Behavioral changes:  no Fevers:  no  WEIGHT GAIN Duration: chronic Previous attempts at weight loss: yes Complications of obesity: HTN Peak weight: current  Weight loss goal: about 70lbs Weight loss to date: nothing Requesting obesity pharmacotherapy: yes Current weight loss supplements/medications: no Previous weight loss supplements/meds: yes- topamax   Active Ambulatory Problems    Diagnosis Date Noted  . Essential hypertension, benign 10/14/2012  . GERD (gastroesophageal reflux disease) 10/14/2012  . Pseudotumor cerebri 10/14/2012  . Constipation 10/14/2012  . Anemia 10/14/2012  . Vaginal discharge 12/14/2013  . Leg swelling 12/14/2013  . Undiagnosed cardiac murmurs 05/21/2014  . Right knee pain 05/21/2014  . Hypokalemia 07/26/2014  . Severe obesity (BMI >= 40) (HCC)  07/30/2014  . Heavy periods 07/30/2014  . Health care maintenance 10/07/2014  . Snoring 12/19/2014  . Allergic reaction 05/04/2015  . Headache 04/07/2016   Resolved Ambulatory Problems    Diagnosis Date Noted  . Screening for STD (sexually transmitted disease) 02/11/2013  . Vaginal discharge 02/11/2013  . UTI (urinary tract infection) 06/18/2013   Past Medical History:  Diagnosis Date  . Adenomyosis 11/15/2014  . GERD (gastroesophageal reflux disease)   . Hypertension   . Hypokalemia   . IDA (iron deficiency anemia)   . Menorrhagia with irregular cycle   . Pseudotumor cerebri dx 2007  . Severe obesity (HCC)    Current Outpatient Prescriptions on File Prior to Visit  Medication Sig Dispense Refill  . levonorgestrel (MIRENA) 20 MCG/24HR IUD 1 each by Intrauterine route once.     No current facility-administered medications on file prior to visit.    Allergies  Allergen Reactions  . Tylenol [Acetaminophen] Hives   Past Surgical History:  Procedure Laterality Date  . TONSILLECTOMY  2007  . TONSILLECTOMY  2007  . TUBAL LIGATION  2002   Social History   Social History  . Marital status: Married    Spouse name: N/A  . Number of children: 3  . Years of education: BS   Occupational History  .  [employer]   Social History Main Topics  . Smoking status: Never Smoker  . Smokeless tobacco: Never Used  . Alcohol use 0.0 oz/week     Comment: Rare/Occasional  . Drug  use: No  . Sexual activity: Yes    Birth control/ protection: None, Surgical, IUD   Other Topics Concern  . Not on file   Social History Narrative   2-3 caffeine drinks a week    Family History  Problem Relation Age of Onset  . Hypertension Mother   . Hypercholesterolemia Mother   . Thyroid disease Mother   . Stroke Maternal Grandmother   . Colon cancer Neg Hx   . Breast cancer Neg Hx   . Ovarian cancer Neg Hx   . Heart disease Neg Hx    Review of Systems  Constitutional: Negative.     Respiratory: Negative.   Cardiovascular: Negative.   Psychiatric/Behavioral: Negative.     Per HPI unless specifically indicated above     Objective:    BP 128/80   Pulse 95   Temp 98.4 F (36.9 C)   Ht 5' 8.2" (1.732 m)   Wt 273 lb (123.8 kg)   LMP 03/23/2016 (Exact Date)   SpO2 97%   BMI 41.27 kg/m   Wt Readings from Last 3 Encounters:  04/07/16 273 lb (123.8 kg)  01/21/16 274 lb (124.3 kg)  05/01/15 267 lb 9.6 oz (121.4 kg)    Physical Exam  Constitutional: She is oriented to person, place, and time. She appears well-developed and well-nourished. No distress.  HENT:  Head: Normocephalic and atraumatic.  Right Ear: Hearing normal.  Left Ear: Hearing normal.  Nose: Nose normal.  Eyes: Conjunctivae and lids are normal. Right eye exhibits no discharge. Left eye exhibits no discharge. No scleral icterus.  Cardiovascular: Normal rate, regular rhythm, normal heart sounds and intact distal pulses.  Exam reveals no gallop and no friction rub.   No murmur heard. Pulmonary/Chest: Effort normal and breath sounds normal. No respiratory distress. She has no wheezes. She has no rales. She exhibits no tenderness.  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  Skin: Skin is warm, dry and intact. No rash noted. No erythema. No pallor.  Psychiatric: She has a normal mood and affect. Her speech is normal and behavior is normal. Judgment and thought content normal. Cognition and memory are normal.  Nursing note and vitals reviewed.   Results for orders placed or performed in visit on 01/21/16  WET PREP BY MOLECULAR PROBE  Result Value Ref Range   Candida species NEG Negative   Trichomonas vaginosis NEG Negative   Gardnerella vaginalis POS (A) Negative  GC/Chlamydia Probe Amp  Result Value Ref Range   CT Probe RNA NOT DETECTED    GC Probe RNA NOT DETECTED       Assessment & Plan:   Problem List Items Addressed This  Visit      Other   Severe obesity (BMI >= 40) (HCC)    Will consider belviq vs contrave. Information given. Patient will look over and let us know what she wants to start. Follow up 4-6 weeks after starting.       Headache    Seems to be due to SCM spasm. Will start flexeril at night and stretches. Follow up 4-6 weeks      Relevant Medications   cyclobenzaprine (FLEXERIL) 10 MG tablet    Other Visit Diagnoses   None.      Follow up plan: Return 4-6 weeks, for Follow up obesity medicine.

## 2016-04-07 NOTE — Assessment & Plan Note (Signed)
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 Will start flexeril at night and stretches. Follow up 4-6 weeks

## 2016-04-07 NOTE — Assessment & Plan Note (Addendum)
Will consider belviq vs contrave. Information given. Patient will look over and let us know what she wants to start. Follow up 4-6 weeks after starting.   EDIT: 04/08/16 11:33AM- patient looked over information and would like to try contrave. Rx sent to her pharmacy. Follow up 4 weeks

## 2016-04-07 NOTE — Patient Instructions (Addendum)
Lorcaserin oral tablets What is this medicine? LORCASERIN (lor ca SER in) is used to promote and maintain weight loss in obese patients. This medicine should be used with a reduced calorie diet and, if appropriate, an exercise program. This medicine may be used for other purposes; ask your health care provider or pharmacist if you have questions. What should I tell my health care provider before I take this medicine? They need to know if you have any of these conditions: -anatomical deformation of the penis, Peyronie's disease, or history of priapism (painful and prolonged erection) -diabetes -heart disease -history of blood diseases, like sickle cell anemia or leukemia -history of irregular heartbeat -kidney disease -liver disease -suicidal thoughts, plans, or attempt; a previous suicide attempt by you or a family member -an unusual or allergic reaction to lorcaserin, other medicines, foods, dyes, or preservatives -pregnant or trying to get pregnant -breast-feeding How should I use this medicine? Take this medicine by mouth with a glass of water. Follow the directions on the prescription label. You can take it with or without food. Take your medicine at regular intervals. Do not take it more often than directed. Do not stop taking except on your doctor's advice. Talk to your pediatrician regarding the use of this medicine in children. Special care may be needed. Overdosage: If you think you have taken too much of this medicine contact a poison control center or emergency room at once. NOTE: This medicine is only for you. Do not share this medicine with others. What if I miss a dose? If you miss a dose, take it as soon as you can. If it is almost time for your next dose, take only that dose. Do not take double or extra doses. What may interact with this medicine? -cabergoline -certain medicines for depression, anxiety, or psychotic disturbances -certain medicines for erectile  dysfunction -certain medicines for migraine headache like almotriptan, eletriptan, frovatriptan, naratriptan, rizatriptan, sumatriptan, zolmitriptan -dextromethorphan -linezolid -lithium -medicines for diabetes -other weight loss products -tramadol -St. John's Wort -stimulant medicines for attention disorders, weight loss, or to stay awake -tryptophan This list may not describe all possible interactions. Give your health care provider a list of all the medicines, herbs, non-prescription drugs, or dietary supplements you use. Also tell them if you smoke, drink alcohol, or use illegal drugs. Some items may interact with your medicine. What should I watch for while using this medicine? This medicine is intended to be used in addition to a healthy diet and appropriate exercise. The best results are achieved this way. Your doctor should instruct you to stop taking this medicine if you do not lose a certain amount of weight within the first 12 weeks of treatment, but it is important that you do not change your dose in any way without consulting your doctor or health care professional. Visit your doctor or health care professional for regular checkups. Your doctor may order blood tests or other tests to see how you are doing. Do not drive, use machinery, or do anything that needs mental alertness until you know how this medicine affects you. This medicine may affect blood sugar levels. If you have diabetes, check with your doctor or health care professional before you change your diet or the dose of your diabetic medicine. Patients and their families should watch out for worsening depression or thoughts of suicide. Also watch out for sudden changes in feelings such as feeling anxious, agitated, panicky, irritable, hostile, aggressive, impulsive, severely restless, overly excited and hyperactive, or   not being able to sleep. If this happens, especially at the beginning of treatment or after a change in dose,  call your health care professional. Contact your doctor or health care professional right away if you are a man with an erection that lasts longer than 4 hours or if the erection becomes painful. This may be a sign of serious problem and must be treated right away to prevent permanent damage. What side effects may I notice from receiving this medicine? Side effects that you should report to your doctor or health care professional as soon as possible: -allergic reactions like skin rash, itching or hives, swelling of the face, lips, or tongue -abnormal production of milk -breast enlargement in both males and females -breathing problems -changes in emotions or moods -changes in vision -confusion -erection lasting more than 4 hours or a painful erection -fast or irregular heart beat -feeling faint or lightheaded, falls -fever or chills, sore throat -hallucination, loss of contact with reality -high or low blood pressure -menstrual changes -restlessness -slow or irregular heartbeat -stiff muscles -sweating -suicidal thoughts or other mood changes -swelling of the ankles, feet, [place] -unusually weak or tired -vomiting Side effects that usually do not require medical attention (Report these to your doctor or health care professional if they continue or are bothersome.): -back pain -constipation -cough -dry mouth -nausea -tiredness This list may not describe all possible side effects. Call your doctor for medical advice about side effects. You may report side effects to FDA at 1-800-FDA-1088. Where should I keep my medicine? Keep out of the reach of children. This medicine can be abused. Keep your medicine in a safe place to protect it from theft. Do not share this medicine with anyone. Selling or giving away this medicine is dangerous and against the law. Store at room temperature between 15 and 30 degrees C (59 and 86 degrees F). Throw away any unused medicine after the expiration  date. NOTE: This sheet is a summary. It may not cover all possible information. If you have questions about this medicine, talk to your doctor, pharmacist, or health care provider.    2016, Elsevier/Gold Standard. (2015-02-12 16:21:05) Bupropion; Naltrexone extended-release tablets What is this medicine? BUPROPION; NALTREXONE (byoo PROE pee on; nal TREX one) is a combination product used to promote and maintain weight loss in obese adults or overweight adults who also have weight related medical problems. This medicine should be used with a reduced calorie diet and increased physical activity. This medicine may be used for other purposes; ask your health care provider or pharmacist if you have questions. What should I tell my health care provider before I take this medicine? They need to know if you have any of these conditions: -an eating disorder, such as anorexia or bulimia -diabetes -glaucoma -head injury -heart disease -high blood pressure -history of a drug or alcohol abuse problem -history of a tumor or infection of your brain or spine -history of stroke -history of irregular heartbeat -kidney disease -liver disease -mental illness such as bipolar disorder or psychosis -seizures -suicidal thoughts, plans, or attempt; a previous suicide attempt by you or a family member -an unusual or allergic reaction to bupropion, naltrexone, other medicines, foods, dyes, or preservatives breast-feeding -pregnant or trying to become pregnant How should I use this medicine? Take this medicine by mouth with a glass of water. Follow the directions on the prescription label. Take this medicine in the morning and in the evenings as directed by your healthcare professional. Bonita Quin  can take it with or without food. Do not take with high-fat meals as this may increase your risk of seizures. Do not crush, chew, or cut these tablets. Do not take your medicine more often than directed. Do not stop taking this  medicine suddenly except upon the advice of your doctor. A special MedGuide will be given to you by the pharmacist with each prescription and refill. Be sure to read this information carefully each time. Talk to your pediatrician regarding the use of this medicine in children. Special care may be needed. Overdosage: If you think you have taken too much of this medicine contact a poison control center or emergency room at once. NOTE: This medicine is only for you. Do not share this medicine with others. What if I miss a dose? If you miss a dose, skip the missed dose and take your next tablet at the regular time. Do not take double or extra doses. What may interact with this medicine? Do not take this medicine with any of the following medications: -any prescription or street opioid drug like codiene, heroin, methadone -linezolid -MAOIs like Carbex, Eldepryl, Marplan, Nardil, and Parnate -methylene blue (injected into a vein) -other medicines that contain bupropion like Zyban or Wellbutrin This medicine may also interact with the following medications: -alcohol -certain medicines for anxiety or sleep -certain medicines for blood pressure like metoprolol, propranolol -certain medicines for depression or psychotic disturbances -certain medicines for HIV or AIDS like efavirenz, lopinavir, nelfinavir, ritonavir -certain medicines for irregular heart beat like propafenone, flecainide -certain medicines for Parkinson's disease like amantadine, levodopa -certain medicines for seizures like carbamazepine, phenytoin, phenobarbital -cimetidine -clopidogrel -cyclophosphamide -disulfiram -furazolidone -isoniazid -nicotine -orphenadrine -procarbazine -steroid medicines like prednisone or cortisone -stimulant medicines for attention disorders, weight loss, or to stay awake -tamoxifen -theophylline -thioridazine -thiotepa -ticlopidine -tramadol -warfarin This list may not describe all  possible interactions. Give your health care provider a list of all the medicines, herbs, non-prescription drugs, or dietary supplements you use. Also tell them if you smoke, drink alcohol, or use illegal drugs. Some items may interact with your medicine. What should I watch for while using this medicine? This medicine is intended to be used in addition to a healthy diet and appropriate exercise. The best results are achieved this way. Do not increase or in any way change your dose without consulting your doctor or health care professional. Do not take this medicine with other prescription or over-the-counter weight loss products without consulting your doctor or health care professional. Your doctor should tell you to stop taking this medicine if you do not lose a certain amount of weight within the first 12 weeks of treatment. Visit your doctor or health care professional for regular checkups. Your doctor may order blood tests or other tests to see how you are doing. This medicine may affect blood sugar levels. If you have diabetes, check with your doctor or health care professional before you change your diet or the dose of your diabetic medicine. Patients and their families should watch out for new or worsening depression or thoughts of suicide. Also watch out for sudden changes in feelings such as feeling anxious, agitated, panicky, irritable, hostile, aggressive, impulsive, severely restless, overly excited and hyperactive, or not being able to sleep. If this happens, especially at the beginning of treatment or after a change in dose, call your health care professional. Avoid alcoholic drinks while taking this medicine. Drinking large amounts of alcoholic beverages, using sleeping or anxiety medicines, or quickly  stopping the use of these agents while taking this medicine may increase your risk for a seizure. What side effects may I notice from receiving this medicine? Side effects that you should  report to your doctor or health care professional as soon as possible: -allergic reactions like skin rash, itching or hives, swelling of the face, lips, or tongue -breathing problems -changes in vision, hearing -chest pain -confusion -dark urine -depressed mood -fast or irregular heart beat -fever -hallucination, loss of contact with reality -increased blood pressure -light-colored stools -redness, blistering, peeling or loosening of the skin, including inside the mouth -right upper belly pain -seizures -suicidal thoughts or other mood changes -unusually weak or tired -vomiting -yellowing of the eyes or skin Side effects that usually do not require medical attention (Report these to your doctor or health care professional if they continue or are bothersome.): -constipation -diarrhea -dizziness -dry mouth -headache -nausea -trouble sleeping This list may not describe all possible side effects. Call your doctor for medical advice about side effects. You may report side effects to FDA at 1-800-FDA-1088. Where should I keep my medicine? Keep out of the reach of children. Store at room temperature between 15 and 30 degrees C (59 and 86 degrees F). Throw away any unused medicine after the expiration date. NOTE: This sheet is a summary. It may not cover all possible information. If you have questions about this medicine, talk to your doctor, pharmacist, or health care provider.    2016, Elsevier/Gold Standard. (2013-04-13 15:17:29)

## 2016-04-08 ENCOUNTER — Telehealth: Payer: Self-pay | Admitting: Family Medicine

## 2016-04-08 MED ORDER — NALTREXONE-BUPROPION HCL ER 8-90 MG PO TB12: ORAL_TABLET | ORAL | 1 refills | Status: DC

## 2016-04-08 NOTE — Telephone Encounter (Signed)
Pt called stated she would like to try Contrave. Pharm [facility]. Thanks.

## 2016-04-08 NOTE — Addendum Note (Signed)
Addended by: Dorcas CarrowJOHNSON, MEGAN P on: 04/08/2016 11:35 AM   Modules accepted: Orders

## 2016-04-08 NOTE — Telephone Encounter (Signed)
Forward to provider

## 2016-04-08 NOTE — Telephone Encounter (Signed)
Rx sent to her pharmacy 

## 2016-05-16 ENCOUNTER — Ambulatory Visit: Payer: 59 | Admitting: Family Medicine

## 2016-05-20 ENCOUNTER — Encounter: Payer: Self-pay | Admitting: Family Medicine

## 2016-05-20 ENCOUNTER — Ambulatory Visit (INDEPENDENT_AMBULATORY_CARE_PROVIDER_SITE_OTHER): Payer: 59 | Admitting: Family Medicine

## 2016-05-20 VITALS — BP 122/87 | HR 83 | Temp 98.4°F | Wt 263.7 lb

## 2016-05-20 DIAGNOSIS — D649 Anemia, unspecified: Secondary | ICD-10-CM

## 2016-05-20 DIAGNOSIS — E876 Hypokalemia: Secondary | ICD-10-CM | POA: Diagnosis not present

## 2016-05-20 DIAGNOSIS — IMO0001 Reserved for inherently not codable concepts without codable children: Secondary | ICD-10-CM

## 2016-05-20 DIAGNOSIS — E6609 Other obesity due to excess calories: Secondary | ICD-10-CM | POA: Diagnosis not present

## 2016-05-20 DIAGNOSIS — Z6839 Body mass index (BMI) 39.0-39.9, adult: Secondary | ICD-10-CM

## 2016-05-20 MED ORDER — NALTREXONE-BUPROPION HCL ER 8-90 MG PO TB12: ORAL_TABLET | ORAL | 6 refills | Status: DC

## 2016-05-20 NOTE — Progress Notes (Signed)
BP 122/87 (BP Location: Left Arm, Patient Position: Sitting, Cuff Size: Large)   Pulse 83   Temp 98.4 F (36.9 C)   Wt 263 lb 11.2 oz (119.6 kg)   LMP 05/16/2016 (Approximate)   SpO2 99%   BMI 39.86 kg/m    Subjective:    Patient ID: Rachel Salas, female    DOB: 02-Aug-1975, 40 y.o.   MRN: [id number]  HPI: Rachel Salas is a 40 y.o. female  Chief Complaint  Patient presents with  . Weight Check    Patient states that she has been having hotflashes and wierd dreams   Here today for follow up on tolerance on her contrave. Having some hot flashes and strange dreams, but not bad enough that she can't tolerate them. Would like to continue on the contrave. Has lost 10lbs. She is concerned that she might be dehydrated and would like her iron checked again. Otherwise doing well with no other concerns or complaints at this time.   Relevant past medical, surgical, family and social history reviewed and updated as indicated. Interim medical history since our last visit reviewed. Allergies and medications reviewed and updated.  Review of Systems  Constitutional: Negative.   Respiratory: Negative.   Cardiovascular: Negative.   Psychiatric/Behavioral: Negative.     Per HPI unless specifically indicated above     Objective:    BP 122/87 (BP Location: Left Arm, Patient Position: Sitting, Cuff Size: Large)   Pulse 83   Temp 98.4 F (36.9 C)   Wt 263 lb 11.2 oz (119.6 kg)   LMP 05/16/2016 (Approximate)   SpO2 99%   BMI 39.86 kg/m   Wt Readings from Last 3 Encounters:  05/20/16 263 lb 11.2 oz (119.6 kg)  04/07/16 273 lb (123.8 kg)  01/21/16 274 lb (124.3 kg)    Physical Exam  Constitutional: She is oriented to person, place, and time. She appears well-developed and well-nourished. No distress.  HENT:  Head: Normocephalic and atraumatic.  Right Ear: Hearing normal.  Left Ear: Hearing normal.  Nose: Nose normal.  Eyes: Conjunctivae and lids are normal. Right eye exhibits no  discharge. Left eye exhibits no discharge. No scleral icterus.  Cardiovascular: Normal rate, regular rhythm, normal heart sounds and intact distal pulses.  Exam reveals no gallop and no friction rub.   No murmur heard. Pulmonary/Chest: Effort normal and breath sounds normal. No respiratory distress. She has no wheezes. She has no rales. She exhibits no tenderness.  Musculoskeletal: Normal range of motion.  Neurological: She is alert and oriented to person, place, and time.  Skin: Skin is warm, dry and intact. No rash noted. No erythema. No pallor.  Psychiatric: She has a normal mood and affect. Her speech is normal and behavior is normal. Judgment and thought content normal. Cognition and memory are normal.  Nursing note and vitals reviewed.   Results for orders placed or performed in visit on 01/21/16  WET PREP BY MOLECULAR PROBE  Result Value Ref Range   Candida species NEG Negative   Trichomonas vaginosis NEG Negative   Gardnerella vaginalis POS (A) Negative  GC/Chlamydia Probe Amp  Result Value Ref Range   CT Probe RNA NOT DETECTED    GC Probe RNA NOT DETECTED       Assessment & Plan:   Problem List Items Addressed This Visit      Other   Anemia    Rechecking levels today. Await results.       Relevant Orders  CBC with Differential/Platelet   Iron and TIBC   Ferritin   Hypokalemia - Primary    Rechecking levels today. Await results.       Relevant Orders   Comprehensive metabolic panel   Obesity    Congratulated patient on 10lb weight loss. Tolerating contrave OK. Continue contrave. Recheck 6 months.       Relevant Medications   Naltrexone-Bupropion HCl ER (CONTRAVE) 8-90 MG TB12    Other Visit Diagnoses   None.      Follow up plan: Return in about 6 months (around 11/17/2016) for Physical.

## 2016-05-20 NOTE — Assessment & Plan Note (Signed)
Rechecking levels today. Await results.  

## 2016-05-20 NOTE — Assessment & Plan Note (Signed)
Congratulated patient on 10lb weight loss. Tolerating contrave OK. Continue contrave. Recheck 6 months.

## 2016-05-21 LAB — COMPREHENSIVE METABOLIC PANEL
A/G RATIO: 1.2 (ref 1.2–2.2)
ALBUMIN: 3.9 g/dL (ref 3.5–5.5)
ALK PHOS: 77 IU/L (ref 39–117)
ALT: 6 IU/L (ref 0–32)
AST: 11 IU/L (ref 0–40)
BILIRUBIN TOTAL: 0.6 mg/dL (ref 0.0–1.2)
BUN / CREAT RATIO: 9 (ref 9–23)
BUN: 9 mg/dL (ref 6–20)
CO2: 24 mmol/L (ref 18–29)
CREATININE: 0.99 mg/dL (ref 0.57–1.00)
Calcium: 8.7 mg/dL (ref 8.7–10.2)
Chloride: 100 mmol/L (ref 96–106)
GFR calc Af Amer: 84 mL/min/{1.73_m2} (ref >59)
GFR calc non Af Amer: 73 mL/min/{1.73_m2} (ref >59)
GLOBULIN, TOTAL: 3.2 g/dL (ref 1.5–4.5)
Glucose: 87 mg/dL (ref 65–99)
POTASSIUM: 3.9 mmol/L (ref 3.5–5.2)
SODIUM: 138 mmol/L (ref 134–144)
Total Protein: 7.1 g/dL (ref 6.0–8.5)

## 2016-05-21 LAB — CBC WITH DIFFERENTIAL/PLATELET
BASOS: 0 %
Basophils Absolute: 0 10*3/uL (ref 0.0–0.2)
EOS (ABSOLUTE): 0.2 10*3/uL (ref 0.0–0.4)
EOS: 3 %
HEMATOCRIT: 34 % (ref 34.0–46.6)
Hemoglobin: 11.4 g/dL (ref 11.1–15.9)
Immature Grans (Abs): 0 10*3/uL (ref 0.0–0.1)
Immature Granulocytes: 0 %
LYMPHS ABS: 1.7 10*3/uL (ref 0.7–3.1)
Lymphs: 25 %
MCH: 25.7 pg — ABNORMAL LOW (ref 26.6–33.0)
MCHC: 33.5 g/dL (ref 31.5–35.7)
MCV: 77 fL — AB (ref 79–97)
MONOS ABS: 0.4 10*3/uL (ref 0.1–0.9)
Monocytes: 5 %
NEUTROS PCT: 67 %
Neutrophils Absolute: 4.6 10*3/uL (ref 1.4–7.0)
PLATELETS: 281 10*3/uL (ref 150–379)
RBC: 4.44 x10E6/uL (ref 3.77–5.28)
RDW: 16.2 % — AB (ref 12.3–15.4)
WBC: 6.8 10*3/uL (ref 3.4–10.8)

## 2016-05-21 LAB — IRON AND TIBC
Iron Saturation: 16 % (ref 15–55)
Iron: 37 ug/dL (ref 27–159)
Total Iron Binding Capacity: 227 ug/dL — ABNORMAL LOW (ref 250–450)
UIBC: 190 ug/dL (ref 131–425)

## 2016-05-21 LAB — FERRITIN: Ferritin: 82 ng/mL (ref 15–150)

## 2016-06-04 ENCOUNTER — Encounter: Payer: Self-pay | Admitting: Family Medicine

## 2016-06-04 MED ORDER — ONDANSETRON 8 MG PO TBDP: 8.0000 mg | ORAL_TABLET | Freq: Three times a day (TID) | ORAL | 1 refills | Status: DC | PRN

## 2016-08-08 ENCOUNTER — Encounter: Payer: Self-pay | Admitting: Family Medicine

## 2016-08-08 NOTE — Telephone Encounter (Signed)
Routing to provider  

## 2016-09-03 ENCOUNTER — Ambulatory Visit (INDEPENDENT_AMBULATORY_CARE_PROVIDER_SITE_OTHER): Payer: 59 | Admitting: Family Medicine

## 2016-09-03 ENCOUNTER — Ambulatory Visit (INDEPENDENT_AMBULATORY_CARE_PROVIDER_SITE_OTHER): Payer: 59 | Admitting: Certified Nurse Midwife

## 2016-09-03 ENCOUNTER — Encounter: Payer: Self-pay | Admitting: Certified Nurse Midwife

## 2016-09-03 ENCOUNTER — Encounter: Payer: Self-pay | Admitting: Family Medicine

## 2016-09-03 VITALS — BP 115/81 | HR 83 | Temp 98.3°F | Resp 17 | Ht 68.2 in | Wt 240.0 lb

## 2016-09-03 VITALS — BP 125/79 | HR 84 | Ht 68.0 in | Wt 239.7 lb

## 2016-09-03 DIAGNOSIS — Z30432 Encounter for removal of intrauterine contraceptive device: Secondary | ICD-10-CM

## 2016-09-03 DIAGNOSIS — Z975 Presence of (intrauterine) contraceptive device: Secondary | ICD-10-CM

## 2016-09-03 DIAGNOSIS — Z538 Procedure and treatment not carried out for other reasons: Secondary | ICD-10-CM | POA: Diagnosis not present

## 2016-09-03 NOTE — Patient Instructions (Addendum)
va Endometrial Ablation Endometrial ablation removes the lining of the uterus (endometrium). It is usually a same-day, outpatient treatment. Ablation helps avoid major surgery, such as surgery to remove the cervix and uterus (hysterectomy). After endometrial ablation, you will have little or no menstrual bleeding and may not be able to have children. However, if you are premenopausal, you will need to use a reliable method of birth control following the procedure because of the small chance that pregnancy can occur. There are different reasons to have this procedure. These reasons include:  Heavy periods.  Bleeding that is causing anemia.  Irregular bleeding.  Bleeding fibroids on the lining inside the uterus if they are smaller than 3 centimeters. This procedure may not be possible for you if:   You want to have children in the future.   You have severe cramps with your menstrual period.   You have precancerous or cancerous cells in your uterus.   You were recently pregnant.   You have gone through menopause.   You have had major surgery on your uterus, resulting in thinning of the uterine wall. Surgeries may include:  The removal of one or more uterine fibroids (myomectomy).  A cesarean section with a classic (vertical) incision on your uterus. Ask your health care provider what type of cesarean you had. Sometimes the scar on your skin is different than the scar on your uterus. Even if you have had surgery on your uterus, certain types of ablation may still be safe for you. Talk with your health care provider. LET [facility] CARE PROVIDER KNOW ABOUT:  Any allergies you have.  All medicines you are taking, including vitamins, herbs, eye drops, creams, and over-the-counter medicines.  Previous problems you or members of your family have had with the use of anesthetics.  Any blood disorders you have.  Previous surgeries you have had.  Medical conditions you have. RISKS  AND COMPLICATIONS  Generally, this is a safe procedure. However, as with any procedure, complications can occur. Possible complications include:  Perforation of the uterus.  Bleeding.  Infection of the uterus, bladder, or vagina.  Injury to surrounding organs.  An air bubble to the lung (air embolus).  Pregnancy following the procedure.  Failure of the procedure to help the problem, requiring hysterectomy.  Decreased ability to diagnose cancer in the lining of the uterus. BEFORE THE PROCEDURE  The lining of the uterus must be tested to make sure there is no pre-cancerous or cancer cells present.  An ultrasound may be performed to look at the size of the uterus and to check for abnormalities.  Medicines may be given to thin the lining of the uterus. PROCEDURE  During the procedure, your health care provider will use a tool called a resectoscope to help see inside your uterus. There are different ways to remove the lining of your uterus.   Radiofrequency - This method uses a radiofrequency-alternating electric current to remove the lining of the uterus.  Cryotherapy - This method uses extreme cold to freeze the lining of the uterus.  Heated-Free Liquid - This method uses heated salt (saline) solution to remove the lining of the uterus.  Microwave - This method uses high-energy microwaves to heat up the lining of the uterus to remove it.  Thermal balloon - This method involves inserting a catheter with a balloon tip into the uterus. The balloon tip is filled with heated fluid to remove the lining of the uterus. AFTER THE PROCEDURE  After your procedure,  do not have sexual intercourse or insert anything into your vagina until permitted by your health care provider. After the procedure, you may experience:  Cramps.  Vaginal discharge.  Frequent urination. This information is not intended to replace advice given to you by your health care provider. Make sure you discuss any  questions you have with your health care provider. Document Released: 05/16/2004 Document Revised: 03/28/2015 Document Reviewed: 12/08/2012 Elsevier Interactive Patient Education  2017 [employer]  Dysfunctional Uterine Bleeding Introduction Dysfunctional uterine bleeding is abnormal bleeding from the uterus. Dysfunctional uterine bleeding includes:  A period that comes earlier or later than usual.  A period that is lighter, heavier, or has blood clots.  Bleeding between periods.  Skipping one or more periods.  Bleeding after sexual intercourse.  Bleeding after menopause. Follow these instructions at home: Pay attention to any changes in your symptoms. Follow these instructions to help with your condition: Eating and drinking  Eat well-balanced meals. Include foods that are high in iron, such as liver, meat, shellfish, green leafy vegetables, and eggs.  If you become constipated:  Drink plenty of water.  Eat fruits and vegetables that are high in water and fiber, such as spinach, carrots, raspberries, apples, and mango. Medicines  Take over-the-counter and prescription medicines only as told by your health care provider.  Do not change medicines without talking with your health care provider.  Aspirin or medicines that contain aspirin may make the bleeding worse. Do not take those medicines:  During the week before your period.  During your period.  If you were prescribed iron pills, take them as told by your health care provider. Iron pills help to replace iron that your body loses because of this condition. Activity  If you need to change your sanitary pad or tampon more than one time every 2 hours:  Lie in bed with your feet raised (elevated).  Place a cold pack on your lower abdomen.  Rest as much as possible until the bleeding stops or slows down.  Do not try to lose weight until the bleeding has stopped and your blood iron level is back to normal. Other  Instructions  For two months, write down:  When your period starts.  When your period ends.  When any abnormal bleeding occurs.  What problems you notice.  Keep all follow up visits as told by your health care provider. This is important. Contact a health care provider if:  You get light-headed or weak.  You have nausea and vomiting.  You cannot eat or drink without vomiting.  You feel dizzy or have diarrhea while you are taking medicines.  You are taking birth control pills or hormones, and you want to change them or stop taking them. Get help right away if:  You develop a fever or chills.  You need to change your sanitary pad or tampon more than one time per hour.  Your bleeding becomes heavier, or your flow contains clots more often.  You develop pain in your abdomen.  You lose consciousness.  You develop a rash. This information is not intended to replace advice given to you by your health care provider. Make sure you discuss any questions you have with your health care provider. Document Released: 07/04/2000 Document Revised: 12/13/2015 Document Reviewed: 10/02/2014  2017 Elsevier

## 2016-09-03 NOTE — Progress Notes (Signed)
Patient ID: Rachel Salas, female   DOB: 07/03/1976, 41 y.o.   MRN: [id number] Pt presents as [facility] to have IUD removed. PCP could not find string. Pt had BTL but got her IUD due to heavy bleeding in 2016. IUD slowed bleeding down for a while but has now picked back up. Last 5 months pt has been having vaginal bleeding non-stop. Pt also states she may need something for anxiety.

## 2016-09-03 NOTE — Progress Notes (Signed)
[name] is a 41 y.o. year old [id number] African American female who presents for removal of a Mirena IUD. She was seen this morning by her PCP for removal of the Mirena IUD which was unsuccessful.  Her Mirena IUD was placed 2016 and has caused irregular/heavy bleeding.    BP 125/79   Pulse 84   Ht 5\' 8"  (1.727 m)   Wt 239 lb 11.2 oz (108.7 kg)   BMI 36.45 kg/m   Time out was performed. Procedure for removal discussed. [name] understands and consents to removal.   A graves speculum was placed in the vagina.  The cervix was visualized, and the strings were visible. They were grasped and the Mirena was easily removed intact without complications.   F/U as discussed for evaluation by MD for endometrial ablation.   Doreene BurkeAnnie Kairav Russomanno, CNM

## 2016-09-03 NOTE — Progress Notes (Signed)
BP 115/81 (BP Location: Right Arm, Patient Position: Sitting, Cuff Size: Large)   Pulse 83   Temp 98.3 F (36.8 C) (Oral)   Resp 17   Ht 5' 8.2" (1.732 m)   Wt 240 lb (108.9 kg)   SpO2 98%   BMI 36.28 kg/m    Subjective:    Patient ID: Rachel Salas, female    DOB: 1976/05/11, 41 y.o.   MRN: [id number]  HPI: Rachel Salas is a 41 y.o. female  Chief Complaint  Patient presents with  . IUD removal   Has been bleeding off and on while on the mirena. Doesn't want it any more. Lots of stress. Otherwise feeling well. No other concerns.   Relevant past medical, surgical, family and social history reviewed and updated as indicated. Interim medical history since our last visit reviewed. Allergies and medications reviewed and updated.  Review of Systems  Constitutional: Negative.   Respiratory: Negative.   Cardiovascular: Negative.   Genitourinary: Positive for vaginal bleeding. Negative for decreased urine volume, difficulty urinating, dyspareunia, dysuria, enuresis, flank pain, frequency, genital sores, hematuria, menstrual problem, pelvic pain, urgency, vaginal discharge and vaginal pain.  Psychiatric/Behavioral: Negative.     Per HPI unless specifically indicated above     Objective:    BP 115/81 (BP Location: Right Arm, Patient Position: Sitting, Cuff Size: Large)   Pulse 83   Temp 98.3 F (36.8 C) (Oral)   Resp 17   Ht 5' 8.2" (1.732 m)   Wt 240 lb (108.9 kg)   SpO2 98%   BMI 36.28 kg/m   Wt Readings from Last 3 Encounters:  09/03/16 240 lb (108.9 kg)  05/20/16 263 lb 11.2 oz (119.6 kg)  04/07/16 273 lb (123.8 kg)    Physical Exam  Constitutional: She is oriented to person, place, and time. She appears well-developed and well-nourished. No distress.  HENT:  Head: Normocephalic and atraumatic.  Right Ear: Hearing normal.  Left Ear: Hearing normal.  Nose: Nose normal.  Eyes: Conjunctivae and lids are normal. Right eye exhibits no discharge. Left eye  exhibits no discharge. No scleral icterus.  Pulmonary/Chest: Effort normal. No respiratory distress.  Genitourinary: Vagina normal.  Musculoskeletal: Normal range of motion.  Neurological: She is alert and oriented to person, place, and time.  Skin: Skin is warm, dry and intact. No rash noted. No erythema. No pallor.  Psychiatric: She has a normal mood and affect. Her speech is normal and behavior is normal. Judgment and thought content normal. Cognition and memory are normal.  Nursing note and vitals reviewed.   Results for orders placed or performed in visit on 05/20/16  Comprehensive metabolic panel  Result Value Ref Range   Glucose 87 65 - 99 mg/dL   BUN 9 6 - 20 mg/dL   Creatinine, Ser 8.650.99 0.57 - 1.00 mg/dL   GFR calc non Af Amer 73 >59 mL/min/1.73   GFR calc Af Amer 84 >59 mL/min/1.73   BUN/Creatinine Ratio 9 9 - 23   Sodium 138 134 - 144 mmol/L   Potassium 3.9 3.5 - 5.2 mmol/L   Chloride 100 96 - 106 mmol/L   CO2 24 18 - 29 mmol/L   Calcium 8.7 8.7 - 10.2 mg/dL   Total Protein 7.1 6.0 - 8.5 g/dL   Albumin 3.9 3.5 - 5.5 g/dL   Globulin, Total 3.2 1.5 - 4.5 g/dL   Albumin/Globulin Ratio 1.2 1.2 - 2.2   Bilirubin Total 0.6 0.0 - 1.2 mg/dL   Alkaline  Phosphatase 77 39 - 117 IU/L   AST 11 0 - 40 IU/L   ALT 6 0 - 32 IU/L  CBC with Differential/Platelet  Result Value Ref Range   WBC 6.8 3.4 - 10.8 x10E3/uL   RBC 4.44 3.77 - 5.28 x10E6/uL   Hemoglobin 11.4 11.1 - 15.9 g/dL   Hematocrit 16.1 09.6 - 46.6 %   MCV 77 (L) 79 - 97 fL   MCH 25.7 (L) 26.6 - 33.0 pg   MCHC 33.5 31.5 - 35.7 g/dL   RDW 04.5 (H) 40.9 - 81.1 %   Platelets 281 150 - 379 x10E3/uL   Neutrophils 67 Not Estab. %   Lymphs 25 Not Estab. %   Monocytes 5 Not Estab. %   Eos 3 Not Estab. %   Basos 0 Not Estab. %   Neutrophils Absolute 4.6 1.4 - 7.0 x10E3/uL   Lymphocytes Absolute 1.7 0.7 - 3.1 x10E3/uL   Monocytes Absolute 0.4 0.1 - 0.9 x10E3/uL   EOS (ABSOLUTE) 0.2 0.0 - 0.4 x10E3/uL   Basophils Absolute  0.0 0.0 - 0.2 x10E3/uL   Immature Granulocytes 0 Not Estab. %   Immature Grans (Abs) 0.0 0.0 - 0.1 x10E3/uL  Iron and TIBC  Result Value Ref Range   Total Iron Binding Capacity 227 (L) 250 - 450 ug/dL   UIBC 914 782 - 956 ug/dL   Iron 37 27 - 213 ug/dL   Iron Saturation 16 15 - 55 %  Ferritin  Result Value Ref Range   Ferritin 82 15 - 150 ng/mL      Assessment & Plan:   Problem List Items Addressed This Visit    None    Visit Diagnoses    Attempted IUD removal, unsuccessful    -  Primary   Could not see strings. Very anxious. Will get her into GYN for removal and to discuss ablation. Call with any concerns.    Relevant Orders   Ambulatory referral to Gynecology       Follow up plan: Return for April for physical.

## 2016-10-31 ENCOUNTER — Encounter: Payer: Self-pay | Admitting: Family Medicine

## 2016-11-17 ENCOUNTER — Encounter: Payer: Self-pay | Admitting: Family Medicine

## 2016-11-17 ENCOUNTER — Ambulatory Visit (INDEPENDENT_AMBULATORY_CARE_PROVIDER_SITE_OTHER): Payer: 59 | Admitting: Family Medicine

## 2016-11-17 VITALS — BP 115/80 | HR 83 | Temp 98.4°F | Ht 67.2 in | Wt 237.8 lb

## 2016-11-17 DIAGNOSIS — Z0000 Encounter for general adult medical examination without abnormal findings: Secondary | ICD-10-CM | POA: Diagnosis not present

## 2016-11-17 DIAGNOSIS — D649 Anemia, unspecified: Secondary | ICD-10-CM | POA: Diagnosis not present

## 2016-11-17 DIAGNOSIS — Z6839 Body mass index (BMI) 39.0-39.9, adult: Secondary | ICD-10-CM | POA: Diagnosis not present

## 2016-11-17 DIAGNOSIS — K5900 Constipation, unspecified: Secondary | ICD-10-CM

## 2016-11-17 DIAGNOSIS — I10 Essential (primary) hypertension: Secondary | ICD-10-CM

## 2016-11-17 DIAGNOSIS — K219 Gastro-esophageal reflux disease without esophagitis: Secondary | ICD-10-CM

## 2016-11-17 DIAGNOSIS — E876 Hypokalemia: Secondary | ICD-10-CM | POA: Diagnosis not present

## 2016-11-17 DIAGNOSIS — Z124 Encounter for screening for malignant neoplasm of cervix: Secondary | ICD-10-CM | POA: Diagnosis not present

## 2016-11-17 DIAGNOSIS — E66812 Obesity, class 2: Secondary | ICD-10-CM

## 2016-11-17 DIAGNOSIS — Z13220 Encounter for screening for lipoid disorders: Secondary | ICD-10-CM | POA: Diagnosis not present

## 2016-11-17 MED ORDER — NALTREXONE-BUPROPION HCL ER 8-90 MG PO TB12: ORAL_TABLET | ORAL | 6 refills | Status: DC

## 2016-11-17 NOTE — Progress Notes (Signed)
BP 115/80 (BP Location: Left Arm, Patient Position: Sitting, Cuff Size: Large)   Pulse 83   Temp 98.4 F (36.9 C)   Ht 5' 7.2" (1.707 m)   Wt 237 lb 12.8 oz (107.9 kg)   SpO2 98%   BMI 37.02 kg/m    Subjective:    Patient ID: Rachel Salas, female    DOB: 1976-05-22, 41 y.o.   MRN: [id number]  HPI: Rachel Salas is a 41 y.o. female presenting on 11/17/2016 for comprehensive medical examination. Current medical complaints include:  HYPERTENSION Hypertension status: controlled  Satisfied with current treatment? yes Duration of hypertension: chronic BP monitoring frequency:  not checking BP medication side effects:  no Medication compliance: excellent compliance Aspirin: no Recurrent headaches: no Visual changes: no Palpitations: no Dyspnea: no Chest pain: no Lower extremity edema: no Dizzy/lightheaded: no  WEIGHT GAIN- has been on contrave Duration: chronic Previous attempts at weight loss: yes Complications of obesity: HTN, psudeotumor-cerebrii Peak weight: 274 Weight loss goal: to be healthy  Weight loss to date: 37lbs Current weight loss supplements/medications: yes  GERD GERD control status: controlled  Satisfied with current treatment? yes Heartburn frequency: rarely Medication side effects: no  Medication compliance: good compliance Dysphagia: no Odynophagia:  no Hematemesis: no Blood in stool: no EGD: no  ANEMIA Anemia status: controlled Etiology of anemia: iron def Duration of anemia treatment: chronic  Compliance with treatment: average Iron supplementation side effects: no Severity of anemia: mild Fatigue: yes Decreased exercise tolerance: no  Dyspnea on exertion: no Palpitations: no Bleeding: no Pica: no  Menopausal Symptoms: no  Depression Screen done today and results listed below:  Depression screen [facility] 2/9 11/17/2016 04/07/2016  Decreased Interest 0 0  Down, Depressed, Hopeless 0 0  PHQ - 2 Score 0 0    Past Medical History:    Past Medical History:  Diagnosis Date  . Adenomyosis 11/15/2014   u/s suspicious for adenomyosis  . GERD (gastroesophageal reflux disease)   . Hypertension   . Hypokalemia   . IDA (iron deficiency anemia)   . Menorrhagia with irregular cycle   . Pseudotumor cerebri dx 2007  . Severe obesity (HCC)     Surgical History:  Past Surgical History:  Procedure Laterality Date  . TONSILLECTOMY  2007  . TONSILLECTOMY  2007  . TUBAL LIGATION  2002    Medications:  Current Outpatient Prescriptions on File Prior to Visit  Medication Sig  . cyclobenzaprine (FLEXERIL) 10 MG tablet Take 1 tablet (10 mg total) by mouth at bedtime.  . ondansetron (ZOFRAN ODT) 8 MG disintegrating tablet Take 1 tablet (8 mg total) by mouth every 8 (eight) hours as needed for nausea or vomiting.   No current facility-administered medications on file prior to visit.     Allergies:  Allergies  Allergen Reactions  . Tylenol [Acetaminophen] Hives    Social History:  Social History   Social History  . Marital status: Married    Spouse name: N/A  . Number of children: 3  . Years of education: BS   Occupational History  .  [place]   Social History Main Topics  . Smoking status: Never Smoker  . Smokeless tobacco: Never Used  . Alcohol use 0.0 oz/week     Comment: Rare/Occasional  . Drug use: No  . Sexual activity: Yes    Partners: Male    Birth control/ protection: None, Surgical     Comment: BTL   Other Topics Concern  . Not on  file   Social History Narrative   2-3 caffeine drinks a week    History  Smoking Status  . Never Smoker  Smokeless Tobacco  . Never Used   History  Alcohol Use  . 0.0 oz/week    Comment: Rare/Occasional    Family History:  Family History  Problem Relation Age of Onset  . Hypertension Mother   . Hypercholesterolemia Mother   . Thyroid disease Mother   . Stroke Maternal Grandmother   . Colon cancer Neg Hx   . Breast cancer Neg Hx   . Ovarian cancer  Neg Hx   . Heart disease Neg Hx     Past medical history, surgical history, medications, allergies, family history and social history reviewed with patient today and changes made to appropriate areas of the chart.   Review of Systems  Constitutional: Negative.   HENT: Negative.   Eyes: Negative.   Respiratory: Negative.   Cardiovascular: Negative.   Gastrointestinal: Positive for constipation. Negative for abdominal pain, blood in stool, diarrhea, heartburn, melena, nausea and vomiting.  Genitourinary: Negative.   Musculoskeletal: Negative.   Skin: Negative.   Neurological: Negative.   Endo/Heme/Allergies: Negative.   Psychiatric/Behavioral: Negative.    All other ROS negative except what is listed above and in the HPI.      Objective:    BP 115/80 (BP Location: Left Arm, Patient Position: Sitting, Cuff Size: Large)   Pulse 83   Temp 98.4 F (36.9 C)   Ht 5' 7.2" (1.707 m)   Wt 237 lb 12.8 oz (107.9 kg)   SpO2 98%   BMI 37.02 kg/m   Wt Readings from Last 3 Encounters:  11/17/16 237 lb 12.8 oz (107.9 kg)  09/03/16 239 lb 11.2 oz (108.7 kg)  09/03/16 240 lb (108.9 kg)    Physical Exam  Constitutional: She is oriented to person, place, and time. She appears well-developed and well-nourished. No distress.  HENT:  Head: Normocephalic and atraumatic.  Right Ear: Hearing and external ear normal.  Left Ear: Hearing and external ear normal.  Nose: Nose normal.  Mouth/Throat: Oropharynx is clear and moist. No oropharyngeal exudate.  Eyes: Conjunctivae, EOM and lids are normal. Pupils are equal, round, and reactive to light. Right eye exhibits no discharge. Left eye exhibits no discharge. No scleral icterus.  Neck: Normal range of motion. Neck supple. No JVD present. No tracheal deviation present. No thyromegaly present.  Cardiovascular: Normal rate, regular rhythm, normal heart sounds and intact distal pulses.  Exam reveals no gallop and no friction rub.   No murmur  heard. Pulmonary/Chest: Effort normal and breath sounds normal. No stridor. No respiratory distress. She has no wheezes. She has no rales. She exhibits no tenderness. Right breast exhibits no inverted nipple, no mass, no nipple discharge, no skin change and no tenderness. Left breast exhibits no inverted nipple, no mass, no nipple discharge, no skin change and no tenderness. Breasts are symmetrical.  Abdominal: Soft. Bowel sounds are normal. She exhibits no distension and no mass. There is no tenderness. There is no rebound and no guarding. Hernia confirmed negative in the right inguinal area and confirmed negative in the left inguinal area.  Genitourinary: Uterus normal. No labial fusion. There is no rash, tenderness, lesion or injury on the right labia. There is no rash, tenderness, lesion or injury on the left labia. Uterus is not deviated, not enlarged, not fixed and not tender. Cervix exhibits discharge. Cervix exhibits no motion tenderness and no friability. Right adnexum displays  no mass, no tenderness and no fullness. Left adnexum displays no mass, no tenderness and no fullness. No erythema, tenderness or bleeding in the vagina. No foreign body in the vagina. No signs of injury around the vagina. Vaginal discharge found.  Musculoskeletal: Normal range of motion. She exhibits no edema, tenderness or deformity.  Lymphadenopathy:    She has no cervical adenopathy.       Right: No inguinal adenopathy present.       Left: No inguinal adenopathy present.  Neurological: She is alert and oriented to person, place, and time. She has normal reflexes. She displays normal reflexes. No cranial nerve deficit. She exhibits normal muscle tone. Coordination normal.  Skin: Skin is warm, dry and intact. No rash noted. She is not diaphoretic. No erythema. No pallor.  Psychiatric: She has a normal mood and affect. Her speech is normal and behavior is normal. Judgment and thought content normal. Cognition and memory  are normal.  Nursing note and vitals reviewed.   Results for orders placed or performed in visit on 05/20/16  Comprehensive metabolic panel  Result Value Ref Range   Glucose 87 65 - 99 mg/dL   BUN 9 6 - 20 mg/dL   Creatinine, Ser 4.54 0.57 - 1.00 mg/dL   GFR calc non Af Amer 73 >59 mL/min/1.73   GFR calc Af Amer 84 >59 mL/min/1.73   BUN/Creatinine Ratio 9 9 - 23   Sodium 138 134 - 144 mmol/L   Potassium 3.9 3.5 - 5.2 mmol/L   Chloride 100 96 - 106 mmol/L   CO2 24 18 - 29 mmol/L   Calcium 8.7 8.7 - 10.2 mg/dL   Total Protein 7.1 6.0 - 8.5 g/dL   Albumin 3.9 3.5 - 5.5 g/dL   Globulin, Total 3.2 1.5 - 4.5 g/dL   Albumin/Globulin Ratio 1.2 1.2 - 2.2   Bilirubin Total 0.6 0.0 - 1.2 mg/dL   Alkaline Phosphatase 77 39 - 117 IU/L   AST 11 0 - 40 IU/L   ALT 6 0 - 32 IU/L  CBC with Differential/Platelet  Result Value Ref Range   WBC 6.8 3.4 - 10.8 x10E3/uL   RBC 4.44 3.77 - 5.28 x10E6/uL   Hemoglobin 11.4 11.1 - 15.9 g/dL   Hematocrit 09.8 11.9 - 46.6 %   MCV 77 (L) 79 - 97 fL   MCH 25.7 (L) 26.6 - 33.0 pg   MCHC 33.5 31.5 - 35.7 g/dL   RDW 14.7 (H) 82.9 - 56.2 %   Platelets 281 150 - 379 x10E3/uL   Neutrophils 67 Not Estab. %   Lymphs 25 Not Estab. %   Monocytes 5 Not Estab. %   Eos 3 Not Estab. %   Basos 0 Not Estab. %   Neutrophils Absolute 4.6 1.4 - 7.0 x10E3/uL   Lymphocytes Absolute 1.7 0.7 - 3.1 x10E3/uL   Monocytes Absolute 0.4 0.1 - 0.9 x10E3/uL   EOS (ABSOLUTE) 0.2 0.0 - 0.4 x10E3/uL   Basophils Absolute 0.0 0.0 - 0.2 x10E3/uL   Immature Granulocytes 0 Not Estab. %   Immature Grans (Abs) 0.0 0.0 - 0.1 x10E3/uL  Iron and TIBC  Result Value Ref Range   Total Iron Binding Capacity 227 (L) 250 - 450 ug/dL   UIBC 130 865 - 784 ug/dL   Iron 37 27 - 696 ug/dL   Iron Saturation 16 15 - 55 %  Ferritin  Result Value Ref Range   Ferritin 82 15 - 150 ng/mL      Assessment &  Plan:   Problem List Items Addressed This Visit      Cardiovascular and Mediastinum    Essential hypertension, benign    Under good control. Continue to monitor. No concerns.       Relevant Orders   Comprehensive metabolic panel   Microalbumin, Urine Waived   TSH   UA/M w/rflx Culture, Routine     Digestive   GERD (gastroesophageal reflux disease)    Under good control. Continue to monitor. Call with any concerns.       Relevant Orders   CBC with Differential/Platelet   Comprehensive metabolic panel   TSH   Constipation    Will start fiber and probiotics. Call with any concerns.         Other   Anemia    Rechecking labs today. Await results.       Relevant Orders   Comprehensive metabolic panel   Hypokalemia    Rechecking today. Call with any concerns.       Relevant Orders   Comprehensive metabolic panel   Obesity    Has lost 37lbs! Continue diet and exercise. Continue contrave. Call with any concerns.       Relevant Medications   Naltrexone-Bupropion HCl ER (CONTRAVE) 8-90 MG TB12    Other Visit Diagnoses    Routine general medical examination at a health care facility    -  Primary   Pap done today. Screening labs done today. Continue diet and exercise. Call with any concerns.    Screening cholesterol level       Labs checked today.    Relevant Orders   Lipid Panel w/o Chol/HDL Ratio   Screening for cervical cancer       Pap done today.   Relevant Orders   IGP, Aptima HPV, rfx 16/18,45       Follow up plan: Return in about 6 months (around 05/19/2017) for follow up weight.   LABORATORY TESTING:  - Pap smear: pap done  IMMUNIZATIONS:   - Tdap: Tetanus vaccination status reviewed: last tetanus booster within 10 years. - Influenza: Up to date - Pneumovax: Not applicable   PATIENT COUNSELING:   Advised to take 1 mg of folate supplement per day if capable of pregnancy.   Sexuality: Discussed sexually transmitted diseases, partner selection, use of condoms, avoidance of unintended pregnancy  and contraceptive alternatives.    Advised to avoid cigarette smoking.  I discussed with the patient that most people either abstain from alcohol or drink within safe limits (<=14/week and <=4 drinks/occasion for males, <=7/weeks and <= 3 drinks/occasion for females) and that the risk for alcohol disorders and other health effects rises proportionally with the number of drinks per week and how often a drinker exceeds daily limits.  Discussed cessation/primary prevention of drug use and availability of treatment for abuse.   Diet: Encouraged to adjust caloric intake to maintain  or achieve ideal body weight, to reduce intake of dietary saturated fat and total fat, to limit sodium intake by avoiding high sodium foods and not adding table salt, and to maintain adequate dietary potassium and calcium preferably from fresh fruits, vegetables, and low-fat dairy products.    stressed the importance of regular exercise  Injury prevention: Discussed safety belts, safety helmets, smoke detector, smoking near bedding or upholstery.   Dental health: Discussed importance of regular tooth brushing, flossing, and dental visits.    NEXT PREVENTATIVE PHYSICAL DUE IN 1 YEAR. Return in about 6 months (around 05/19/2017) for follow up weight.

## 2016-11-17 NOTE — Assessment & Plan Note (Signed)
Under good control. Continue to monitor. No concerns.

## 2016-11-17 NOTE — Assessment & Plan Note (Signed)
Under good control. Continue to monitor. Call with any concerns.  

## 2016-11-17 NOTE — Assessment & Plan Note (Signed)
Rechecking today. Call with any concerns.

## 2016-11-17 NOTE — Patient Instructions (Addendum)
Health Maintenance, Female Adopting a healthy lifestyle and getting preventive care can go a long way to promote health and wellness. Talk with your health care provider about what schedule of regular examinations is right for you. This is a good chance for you to check in with your provider about disease prevention and staying healthy. In between checkups, there are plenty of things you can do on your own. Experts have done a lot of research about which lifestyle changes and preventive measures are most likely to keep you healthy. Ask your health care provider for more information. Weight and diet Eat a healthy diet  Be sure to include plenty of vegetables, fruits, low-fat dairy products, and lean protein.  Do not eat a lot of foods high in solid fats, added sugars, or salt.  Get regular exercise. This is one of the most important things you can do for your health.  Most adults should exercise for at least 150 minutes each week. The exercise should increase your heart rate and make you sweat (moderate-intensity exercise).  Most adults should also do strengthening exercises at least twice a week. This is in addition to the moderate-intensity exercise. Maintain a healthy weight  Body mass index (BMI) is a measurement that can be used to identify possible weight problems. It estimates body fat based on height and weight. Your health care provider can help determine your BMI and help you achieve or maintain a healthy weight.  For females 14 years of age and older:  A BMI below 18.5 is considered underweight.  A BMI of 18.5 to 24.9 is normal.  A BMI of 25 to 29.9 is considered overweight.  A BMI of 30 and above is considered obese. Watch levels of cholesterol and blood lipids  You should start having your blood tested for lipids and cholesterol at 39 years of age, then have this test every 5 years.  You may need to have your cholesterol levels checked more often if:  Your lipid or  cholesterol levels are high.  You are older than 39 years of age.  You are at high risk for heart disease. Cancer screening Lung Cancer  Lung cancer screening is recommended for adults 51-16 years old who are at high risk for lung cancer because of a history of smoking.  A yearly low-dose CT scan of the lungs is recommended for people who:  Currently smoke.  Have quit within the past 15 years.  Have at least a 30-pack-year history of smoking. A pack year is smoking an average of one pack of cigarettes a day for 1 year.  Yearly screening should continue until it has been 15 years since you quit.  Yearly screening should stop if you develop a health problem that would prevent you from having lung cancer treatment. Breast Cancer  Practice breast self-awareness. This means understanding how your breasts normally appear and feel.  It also means doing regular breast self-exams. Let your health care provider know about any changes, no matter how small.  If you are in your 20s or 30s, you should have a clinical breast exam (CBE) by a health care provider every 1-3 years as part of a regular health exam.  If you are 57 or older, have a CBE every year. Also consider having a breast X-ray (mammogram) every year.  If you have a family history of breast cancer, talk to your health care provider about genetic screening.  If you are at high risk for breast cancer, talk  to your health care provider about having an MRI and a mammogram every year.  Breast cancer gene (BRCA) assessment is recommended for women who have family members with BRCA-related cancers. BRCA-related cancers include:  Breast.  Ovarian.  Tubal.  Peritoneal cancers.  Results of the assessment will determine the need for genetic counseling and BRCA1 and BRCA2 testing. Cervical Cancer  Your health care provider may recommend that you be screened regularly for cancer of the pelvic organs (ovaries, uterus, and vagina).  This screening involves a pelvic examination, including checking for microscopic changes to the surface of your cervix (Pap test). You may be encouraged to have this screening done every 3 years, beginning at age 31.  For women ages 61-65, health care providers may recommend pelvic exams and Pap testing every 3 years, or they may recommend the Pap and pelvic exam, combined with testing for human papilloma virus (HPV), every 5 years. Some types of HPV increase your risk of cervical cancer. Testing for HPV may also be done on women of any age with unclear Pap test results.  Other health care providers may not recommend any screening for nonpregnant women who are considered low risk for pelvic cancer and who do not have symptoms. Ask your health care provider if a screening pelvic exam is right for you.  If you have had past treatment for cervical cancer or a condition that could lead to cancer, you need Pap tests and screening for cancer for at least 20 years after your treatment. If Pap tests have been discontinued, your risk factors (such as having a new sexual partner) need to be reassessed to determine if screening should resume. Some women have medical problems that increase the chance of getting cervical cancer. In these cases, your health care provider may recommend more frequent screening and Pap tests. Colorectal Cancer  This type of cancer can be detected and often prevented.  Routine colorectal cancer screening usually begins at 39 years of age and continues through 39 years of age.  Your health care provider may recommend screening at an earlier age if you have risk factors for colon cancer.  Your health care provider may also recommend using home test kits to check for hidden blood in the stool.  A small camera at the end of a tube can be used to examine your colon directly (sigmoidoscopy or colonoscopy). This is done to check for the earliest forms of colorectal cancer.  Routine  screening usually begins at age 52.  Direct examination of the colon should be repeated every 5-10 years through 39 years of age. However, you may need to be screened more often if early forms of precancerous polyps or small growths are found. Skin Cancer  Check your skin from head to toe regularly.  Tell your health care provider about any new moles or changes in moles, especially if there is a change in a mole's shape or color.  Also tell your health care provider if you have a mole that is larger than the size of a pencil eraser.  Always use sunscreen. Apply sunscreen liberally and repeatedly throughout the day.  Protect yourself by wearing long sleeves, pants, a wide-brimmed hat, and sunglasses whenever you are outside. Heart disease, diabetes, and high blood pressure  High blood pressure causes heart disease and increases the risk of stroke. High blood pressure is more likely to develop in:  People who have blood pressure in the high end of the normal range (130-139/85-89 mm Hg).  People who are overweight or obese.  People who are African American.  If you are 59-24 years of age, have your blood pressure checked every 3-5 years. If you are 34 years of age or older, have your blood pressure checked every year. You should have your blood pressure measured twice-once when you are at a hospital or clinic, and once when you are not at a hospital or clinic. Record the average of the two measurements. To check your blood pressure when you are not at a hospital or clinic, you can use:  An automated blood pressure machine at a pharmacy.  A home blood pressure monitor.  If you are between 29 years and 60 years old, ask your health care provider if you should take aspirin to prevent strokes.  Have regular diabetes screenings. This involves taking a blood sample to check your fasting blood sugar level.  If you are at a normal weight and have a low risk for diabetes, have this test once  every three years after 39 years of age.  If you are overweight and have a high risk for diabetes, consider being tested at a younger age or more often. Preventing infection Hepatitis B  If you have a higher risk for hepatitis B, you should be screened for this virus. You are considered at high risk for hepatitis B if:  You were born in a country where hepatitis B is common. Ask your health care provider which countries are considered high risk.  Your parents were born in a high-risk country, and you have not been immunized against hepatitis B (hepatitis B vaccine).  You have HIV or AIDS.  You use needles to inject street drugs.  You live with someone who has hepatitis B.  You have had sex with someone who has hepatitis B.  You get hemodialysis treatment.  You take certain medicines for conditions, including cancer, organ transplantation, and autoimmune conditions. Hepatitis C  Blood testing is recommended for:  Everyone born from [birth year] through 1963.  Anyone with known risk factors for hepatitis C. Sexually transmitted infections (STIs)  You should be screened for sexually transmitted infections (STIs) including gonorrhea and chlamydia if:  You are sexually active and are younger than 39 years of age.  You are older than 39 years of age and your health care provider tells you that you are at risk for this type of infection.  Your sexual activity has changed since you were last screened and you are at an increased risk for chlamydia or gonorrhea. Ask your health care provider if you are at risk.  If you do not have HIV, but are at risk, it may be recommended that you take a prescription medicine daily to prevent HIV infection. This is called pre-exposure prophylaxis (PrEP). You are considered at risk if:  You are sexually active and do not regularly use condoms or know the HIV status of your partner(s).  You take drugs by injection.  You are sexually active with a partner  who has HIV. Talk with your health care provider about whether you are at high risk of being infected with HIV. If you choose to begin PrEP, you should first be tested for HIV. You should then be tested every 3 months for as long as you are taking PrEP. Pregnancy  If you are premenopausal and you may become pregnant, ask your health care provider about preconception counseling.  If you may become pregnant, take 400 to 800 micrograms (mcg) of folic acid  every day.  If you want to prevent pregnancy, talk to your health care provider about birth control (contraception). Osteoporosis and menopause  Osteoporosis is a disease in which the bones lose minerals and strength with aging. This can result in serious bone fractures. Your risk for osteoporosis can be identified using a bone density scan.  If you are 27 years of age or older, or if you are at risk for osteoporosis and fractures, ask your health care provider if you should be screened.  Ask your health care provider whether you should take a calcium or vitamin D supplement to lower your risk for osteoporosis.  Menopause may have certain physical symptoms and risks.  Hormone replacement therapy may reduce some of these symptoms and risks. Talk to your health care provider about whether hormone replacement therapy is right for you. Follow these instructions at home:  Schedule regular health, dental, and eye exams.  Stay current with your immunizations.  Do not use any tobacco products including cigarettes, chewing tobacco, or electronic cigarettes.  If you are pregnant, do not drink alcohol.  If you are breastfeeding, limit how much and how often you drink alcohol.  Limit alcohol intake to no more than 1 drink per day for nonpregnant women. One drink equals 12 ounces of beer, 5 ounces of wine, or 1 ounces of hard liquor.  Do not use street drugs.  Do not share needles.  Ask your health care provider for help if you need support  or information about quitting drugs.  Tell your health care provider if you often feel depressed.  Tell your health care provider if you have ever been abused or do not feel safe at home. This information is not intended to replace advice given to you by your health care provider. Make sure you discuss any questions you have with your health care provider. Document Released: 01/20/2011 Document Revised: 12/13/2015 Document Reviewed: 04/10/2015 Elsevier Interactive Patient Education  2017 [employer].  Constipation, Adult Constipation is when a person has fewer bowel movements in a week than normal, has difficulty having a bowel movement, or has stools that are dry, hard, or larger than normal. Constipation may be caused by an underlying condition. It may become worse with age if a person takes certain medicines and does not take in enough fluids. Follow these instructions at home: Eating and drinking    Eat foods that have a lot of fiber, such as fresh fruits and vegetables, whole grains, and beans.  Limit foods that are high in fat, low in fiber, or overly processed, such as french fries, hamburgers, cookies, candies, and soda.  Drink enough fluid to keep your urine clear or pale yellow. General instructions   Exercise regularly or as told by your health care provider.  Go to the restroom when you have the urge to go. Do not hold it in.  Take over-the-counter and prescription medicines only as told by your health care provider. These include any fiber supplements.  Practice pelvic floor retraining exercises, such as deep breathing while relaxing the lower abdomen and pelvic floor relaxation during bowel movements.  Watch your condition for any changes.  Keep all follow-up visits as told by your health care provider. This is important. Contact a health care provider if:  You have pain that gets worse.  You have a fever.  You do not have a bowel movement after 4 days.  You  vomit.  You are not hungry.  You lose weight.  You  are bleeding from the anus.  You have thin, pencil-like stools. Get help right away if:  You have a fever and your symptoms suddenly get worse.  You leak stool or have blood in your stool.  Your abdomen is bloated.  You have severe pain in your abdomen.  You feel dizzy or you faint. This information is not intended to replace advice given to you by your health care provider. Make sure you discuss any questions you have with your health care provider. Document Released: 04/04/2004 Document Revised: 01/25/2016 Document Reviewed: 12/26/2015 Elsevier Interactive Patient Education  2017 [employer].

## 2016-11-17 NOTE — Assessment & Plan Note (Signed)
Has lost 37lbs! Continue diet and exercise. Continue contrave. Call with any concerns.

## 2016-11-17 NOTE — Assessment & Plan Note (Signed)
Rechecking labs today. Await results.  

## 2016-11-17 NOTE — Assessment & Plan Note (Signed)
Will start fiber and probiotics. Call with any concerns.

## 2016-11-18 LAB — CBC WITH DIFFERENTIAL/PLATELET
BASOS ABS: 0 10*3/uL (ref 0.0–0.2)
Basos: 0 %
EOS (ABSOLUTE): 0.1 10*3/uL (ref 0.0–0.4)
Eos: 2 %
HEMOGLOBIN: 10.3 g/dL — AB (ref 11.1–15.9)
Hematocrit: 32.7 % — ABNORMAL LOW (ref 34.0–46.6)
IMMATURE GRANULOCYTES: 0 %
Immature Grans (Abs): 0 10*3/uL (ref 0.0–0.1)
LYMPHS ABS: 2.2 10*3/uL (ref 0.7–3.1)
LYMPHS: 27 %
MCH: 24.6 pg — ABNORMAL LOW (ref 26.6–33.0)
MCHC: 31.5 g/dL (ref 31.5–35.7)
MCV: 78 fL — ABNORMAL LOW (ref 79–97)
MONOCYTES: 5 %
Monocytes Absolute: 0.4 10*3/uL (ref 0.1–0.9)
NEUTROS PCT: 66 %
Neutrophils Absolute: 5.4 10*3/uL (ref 1.4–7.0)
Platelets: 272 10*3/uL (ref 150–379)
RBC: 4.19 x10E6/uL (ref 3.77–5.28)
RDW: 15.9 % — ABNORMAL HIGH (ref 12.3–15.4)
WBC: 8.2 10*3/uL (ref 3.4–10.8)

## 2016-11-18 LAB — COMPREHENSIVE METABOLIC PANEL
ALBUMIN: 4.1 g/dL (ref 3.5–5.5)
ALT: 7 IU/L (ref 0–32)
AST: 12 IU/L (ref 0–40)
Albumin/Globulin Ratio: 1.5 (ref 1.2–2.2)
Alkaline Phosphatase: 77 IU/L (ref 39–117)
BILIRUBIN TOTAL: 0.7 mg/dL (ref 0.0–1.2)
BUN / CREAT RATIO: 15 (ref 9–23)
BUN: 13 mg/dL (ref 6–20)
CHLORIDE: 104 mmol/L (ref 96–106)
CO2: 20 mmol/L (ref 18–29)
Calcium: 8.9 mg/dL (ref 8.7–10.2)
Creatinine, Ser: 0.89 mg/dL (ref 0.57–1.00)
GFR calc non Af Amer: 82 mL/min/{1.73_m2} (ref >59)
GFR, EST AFRICAN AMERICAN: 95 mL/min/{1.73_m2} (ref >59)
Globulin, Total: 2.8 g/dL (ref 1.5–4.5)
Glucose: 87 mg/dL (ref 65–99)
Potassium: 4 mmol/L (ref 3.5–5.2)
Sodium: 141 mmol/L (ref 134–144)
TOTAL PROTEIN: 6.9 g/dL (ref 6.0–8.5)

## 2016-11-18 LAB — LIPID PANEL W/O CHOL/HDL RATIO
Cholesterol, Total: 144 mg/dL (ref 100–199)
HDL: 54 mg/dL (ref >39)
LDL Calculated: 81 mg/dL (ref 0–99)
Triglycerides: 47 mg/dL (ref 0–149)
VLDL CHOLESTEROL CAL: 9 mg/dL (ref 5–40)

## 2016-11-18 LAB — TSH: TSH: 3.16 u[IU]/mL (ref 0.450–4.500)

## 2016-11-20 LAB — IGP, APTIMA HPV, RFX 16/18,45
HPV APTIMA: NEGATIVE
PAP SMEAR COMMENT: 0

## 2016-11-24 ENCOUNTER — Encounter: Payer: Self-pay | Admitting: Family Medicine

## 2016-11-24 NOTE — Telephone Encounter (Signed)
Routing to provider  

## 2016-12-10 ENCOUNTER — Telehealth: Payer: 59 | Admitting: Nurse Practitioner

## 2016-12-10 DIAGNOSIS — H5789 Other specified disorders of eye and adnexa: Secondary | ICD-10-CM

## 2016-12-10 DIAGNOSIS — H578 Other specified disorders of eye and adnexa: Secondary | ICD-10-CM | POA: Diagnosis not present

## 2016-12-10 MED ORDER — POLYMYXIN B-TRIMETHOPRIM 10000-0.1 UNIT/ML-% OP SOLN: 2.0000 [drp] | OPHTHALMIC | 0 refills | Status: DC

## 2016-12-10 NOTE — Progress Notes (Signed)
We are sorry that you are not feeling well.  Here is how we plan to help!  Based on what you have shared with me it looks like you have conjunctivitis.  Conjunctivitis is a common inflammatory or infectious condition of the eye that is often referred to as "pink eye".  In most cases it is contagious (viral or bacterial). However, not all conjunctivitis requires antibiotics (ex. Allergic).  We have made appropriate suggestions for you based upon your presentation.  I have prescribed Polytrim Ophthalmic drops 1-2 drops 4 times a day times 5 days  Pink eye can be highly contagious.  It is typically spread through direct contact with secretions, or contaminated objects or surfaces that one may have touched.  Strict handwashing is suggested with soap and water is urged.  If not available, use alcohol based had sanitizer.  Avoid unnecessary touching of the eye.  If you wear contact lenses, you will need to refrain from wearing them until you see no white discharge from the eye for at least 24 hours after being on medication.  You should see symptom improvement in 1-2 days after starting the medication regimen.  Call us if symptoms are not improved in 1-2 days.  Home Care:  Wash your [place] often!  Do not wear your contacts until you complete your treatment plan.  Avoid sharing towels, bed linen, personal items with a person who has pink eye.  See attention for anyone in your home with similar symptoms.  Get Help Right Away If:  Your symptoms do not improve.  You develop blurred or loss of vision.  Your symptoms worsen (increased discharge, pain or redness)  Your e-visit answers were reviewed by a board certified advanced clinical practitioner to complete your personal care plan.  Depending on the condition, your plan could have included both over the counter or prescription medications.  If there is a problem please reply  once you have received a response from your provider.  Your safety is  important to us.  If you have drug allergies check your prescription carefully.    You can use MyChart to ask questions about today's visit, request a non-urgent call back, or ask for a work or school excuse for 24 hours related to this e-Visit. If it has been greater than 24 hours you will need to follow up with your provider, or enter a new e-Visit to address those concerns.   You will get an e-mail in the next two days asking about your experience.  I hope that your e-visit has been valuable and will speed your recovery. Thank you for using e-visits.      

## 2016-12-24 DIAGNOSIS — H5212 Myopia, left eye: Secondary | ICD-10-CM | POA: Diagnosis not present

## 2016-12-24 DIAGNOSIS — H52223 Regular astigmatism, bilateral: Secondary | ICD-10-CM | POA: Diagnosis not present

## 2016-12-24 DIAGNOSIS — H5213 Myopia, bilateral: Secondary | ICD-10-CM | POA: Diagnosis not present

## 2017-02-02 ENCOUNTER — Encounter: Payer: Self-pay | Admitting: Family Medicine

## 2017-02-03 MED ORDER — VALACYCLOVIR HCL 1 G PO TABS: 2000.0000 mg | ORAL_TABLET | Freq: Two times a day (BID) | ORAL | 12 refills | Status: DC

## 2017-05-19 ENCOUNTER — Encounter: Payer: Self-pay | Admitting: Family Medicine

## 2017-05-19 ENCOUNTER — Ambulatory Visit (INDEPENDENT_AMBULATORY_CARE_PROVIDER_SITE_OTHER): Payer: 59 | Admitting: Family Medicine

## 2017-05-19 VITALS — BP 121/85 | HR 80 | Temp 97.7°F | Wt 234.3 lb

## 2017-05-19 DIAGNOSIS — Z6839 Body mass index (BMI) 39.0-39.9, adult: Secondary | ICD-10-CM

## 2017-05-19 DIAGNOSIS — Z113 Encounter for screening for infections with a predominantly sexual mode of transmission: Secondary | ICD-10-CM

## 2017-05-19 DIAGNOSIS — D649 Anemia, unspecified: Secondary | ICD-10-CM

## 2017-05-19 DIAGNOSIS — I10 Essential (primary) hypertension: Secondary | ICD-10-CM | POA: Diagnosis not present

## 2017-05-19 DIAGNOSIS — K219 Gastro-esophageal reflux disease without esophagitis: Secondary | ICD-10-CM | POA: Diagnosis not present

## 2017-05-19 MED ORDER — NALTREXONE-BUPROPION HCL ER 8-90 MG PO TB12: ORAL_TABLET | ORAL | 6 refills | Status: DC

## 2017-05-19 MED ORDER — CYCLOBENZAPRINE HCL 10 MG PO TABS: 10.0000 mg | ORAL_TABLET | Freq: Every day | ORAL | 0 refills | Status: DC

## 2017-05-19 NOTE — Assessment & Plan Note (Signed)
Doing well, has lost 40lbs. Call with any concerns. Continue contrave.

## 2017-05-19 NOTE — Assessment & Plan Note (Signed)
Under good control. Continue to monitor. Call with any concerns.  

## 2017-05-19 NOTE — Progress Notes (Signed)
BP 121/85 (BP Location: Left Arm, Patient Position: Sitting, Cuff Size: Large)   Pulse 80   Temp 97.7 F (36.5 C)   Wt 234 lb 5 oz (106.3 kg)   LMP 05/05/2017   SpO2 98%   BMI 36.48 kg/m    Subjective:    Patient ID: Rachel Salas, female    DOB: 05/30/1976, 41 y.o.   MRN: [id number]  HPI: Rachel Salas is a 41 y.o. female  Chief Complaint  Patient presents with  . Hypertension  . Anemia  . Gastroesophageal Reflux  . Obesity   HYPERTENSION Hypertension status: controlled  Satisfied with current treatment? yes Duration of hypertension: chronic BP monitoring frequency:  not checking BP medication side effects:  not on anything Aspirin: no Recurrent headaches: no Visual changes: no Palpitations: no Dyspnea: no Chest pain: no Lower extremity edema: no Dizzy/lightheaded: no  WEIGHT GAIN- has been on contrave Duration: chronic Previous attempts at weight loss: yes Complications of obesity: HTN, psudeotumor-cerebrii Peak weight: 274 Weight loss goal: to be healthy  Weight loss to date: 40lbs!! Current weight loss supplements/medications: yes  GERD GERD control status: controlled  Satisfied with current treatment? yes Heartburn frequency: rarely Medication side effects: no  Medication compliance: good compliance Dysphagia: no Odynophagia:  no Hematemesis: no Blood in stool: no EGD: no  ANEMIA Anemia status: stable Etiology of anemia: iron deficiency Duration of anemia treatment: chronic Compliance with treatment: excellent compliance Iron supplementation side effects: no Severity of anemia: mild Fatigue: no Decreased exercise tolerance: no  Dyspnea on exertion: no Palpitations: no Bleeding: no Pica: no  Relevant past medical, surgical, family and social history reviewed and updated as indicated. Interim medical history since our last visit reviewed. Allergies and medications reviewed and updated.  Review of Systems  Constitutional: Negative.    Respiratory: Negative.   Cardiovascular: Negative.   Psychiatric/Behavioral: Negative.     Per HPI unless specifically indicated above     Objective:    BP 121/85 (BP Location: Left Arm, Patient Position: Sitting, Cuff Size: Large)   Pulse 80   Temp 97.7 F (36.5 C)   Wt 234 lb 5 oz (106.3 kg)   LMP 05/05/2017   SpO2 98%   BMI 36.48 kg/m   Wt Readings from Last 3 Encounters:  05/19/17 234 lb 5 oz (106.3 kg)  11/17/16 237 lb 12.8 oz (107.9 kg)  09/03/16 239 lb 11.2 oz (108.7 kg)    Physical Exam  Constitutional: She is oriented to person, place, and time. She appears well-developed and well-nourished. No distress.  HENT:  Head: Normocephalic and atraumatic.  Right Ear: Hearing normal.  Left Ear: Hearing normal.  Nose: Nose normal.  Eyes: Conjunctivae and lids are normal. Right eye exhibits no discharge. Left eye exhibits no discharge. No scleral icterus.  Cardiovascular: Normal rate, regular rhythm, normal heart sounds and intact distal pulses.  Exam reveals no gallop and no friction rub.   No murmur heard. Pulmonary/Chest: Effort normal and breath sounds normal. No respiratory distress. She has no wheezes. She has no rales. She exhibits no tenderness.  Musculoskeletal: Normal range of motion.  Neurological: She is alert and oriented to person, place, and time.  Skin: Skin is warm, dry and intact. No rash noted. She is not diaphoretic. No erythema. No pallor.  Psychiatric: She has a normal mood and affect. Her speech is normal and behavior is normal. Judgment and thought content normal. Cognition and memory are normal.  Nursing note and vitals reviewed.  Results for orders placed or performed in visit on 11/17/16  CBC with Differential/Platelet  Result Value Ref Range   WBC 8.2 3.4 - 10.8 x10E3/uL   RBC 4.19 3.77 - 5.28 x10E6/uL   Hemoglobin 10.3 (L) 11.1 - 15.9 g/dL   Hematocrit 16.1 (L) 09.6 - 46.6 %   MCV 78 (L) 79 - 97 fL   MCH 24.6 (L) 26.6 - 33.0 pg   MCHC  31.5 31.5 - 35.7 g/dL   RDW 04.5 (H) 40.9 - 81.1 %   Platelets 272 150 - 379 x10E3/uL   Neutrophils 66 Not Estab. %   Lymphs 27 Not Estab. %   Monocytes 5 Not Estab. %   Eos 2 Not Estab. %   Basos 0 Not Estab. %   Neutrophils Absolute 5.4 1.4 - 7.0 x10E3/uL   Lymphocytes Absolute 2.2 0.7 - 3.1 x10E3/uL   Monocytes Absolute 0.4 0.1 - 0.9 x10E3/uL   EOS (ABSOLUTE) 0.1 0.0 - 0.4 x10E3/uL   Basophils Absolute 0.0 0.0 - 0.2 x10E3/uL   Immature Granulocytes 0 Not Estab. %   Immature Grans (Abs) 0.0 0.0 - 0.1 x10E3/uL  Comprehensive metabolic panel  Result Value Ref Range   Glucose 87 65 - 99 mg/dL   BUN 13 6 - 20 mg/dL   Creatinine, Ser 9.14 0.57 - 1.00 mg/dL   GFR calc non Af Amer 82 >59 mL/min/1.73   GFR calc Af Amer 95 >59 mL/min/1.73   BUN/Creatinine Ratio 15 9 - 23   Sodium 141 134 - 144 mmol/L   Potassium 4.0 3.5 - 5.2 mmol/L   Chloride 104 96 - 106 mmol/L   CO2 20 18 - 29 mmol/L   Calcium 8.9 8.7 - 10.2 mg/dL   Total Protein 6.9 6.0 - 8.5 g/dL   Albumin 4.1 3.5 - 5.5 g/dL   Globulin, Total 2.8 1.5 - 4.5 g/dL   Albumin/Globulin Ratio 1.5 1.2 - 2.2   Bilirubin Total 0.7 0.0 - 1.2 mg/dL   Alkaline Phosphatase 77 39 - 117 IU/L   AST 12 0 - 40 IU/L   ALT 7 0 - 32 IU/L  Lipid Panel w/o Chol/HDL Ratio  Result Value Ref Range   Cholesterol, Total 144 100 - 199 mg/dL   Triglycerides 47 0 - 149 mg/dL   HDL 54 >78 mg/dL   VLDL Cholesterol Cal 9 5 - 40 mg/dL   LDL Calculated 81 0 - 99 mg/dL  TSH  Result Value Ref Range   TSH 3.160 0.450 - 4.500 uIU/mL  IGP, Aptima HPV, rfx 16/18,45  Result Value Ref Range   DIAGNOSIS: Comment    Specimen adequacy: Comment    Clinician Provided ICD10 Comment    Performed by: Comment    PAP Smear Comment .    Note: Comment    Test Methodology Comment    HPV Aptima Negative Negative      Assessment & Plan:   Problem List Items Addressed This Visit      Cardiovascular and Mediastinum   RESOLVED: Essential hypertension, benign - Primary     Resolved with weight loss. Will resolve off her medication list.       Relevant Orders   Comprehensive metabolic panel     Digestive   GERD (gastroesophageal reflux disease)    Under good control. Continue to monitor. Call with any concerns.       Relevant Orders   CBC with Differential/Platelet   Comprehensive metabolic panel     Other   Anemia  Under good control. Continue to monitor. Call with any concerns.       Relevant Orders   CBC with Differential/Platelet   Iron and TIBC   Ferritin   Obesity    Doing well, has lost 40lbs. Call with any concerns. Continue contrave.       Relevant Medications   Naltrexone-Bupropion HCl ER (CONTRAVE) 8-90 MG TB12   Other Relevant Orders   Comprehensive metabolic panel   Thyroid Panel With TSH    Other Visit Diagnoses    Routine screening for STI (sexually transmitted infection)       Labs drawn today. Await results.    Relevant Orders   HIV antibody   RPR   GC/Chlamydia Probe Amp   Hepatitis, Acute   HSV(herpes simplex vrs) 1+2 ab-IgG       Follow up plan: Return in about 6 months (around 11/17/2017) for Physical.

## 2017-05-19 NOTE — Assessment & Plan Note (Signed)
Resolved with weight loss. Will resolve off her medication list.

## 2017-05-20 ENCOUNTER — Telehealth: Payer: Self-pay | Admitting: Family Medicine

## 2017-05-20 DIAGNOSIS — D500 Iron deficiency anemia secondary to blood loss (chronic): Secondary | ICD-10-CM

## 2017-05-20 LAB — CBC WITH DIFFERENTIAL/PLATELET
Basophils Absolute: 0 10*3/uL (ref 0.0–0.2)
Basos: 0 %
EOS (ABSOLUTE): 0.1 10*3/uL (ref 0.0–0.4)
Eos: 2 %
Hematocrit: 26.8 % — ABNORMAL LOW (ref 34.0–46.6)
Hemoglobin: 8.5 g/dL — ABNORMAL LOW (ref 11.1–15.9)
Immature Grans (Abs): 0 10*3/uL (ref 0.0–0.1)
Immature Granulocytes: 0 %
Lymphocytes Absolute: 1.8 10*3/uL (ref 0.7–3.1)
Lymphs: 29 %
MCH: 22.3 pg — ABNORMAL LOW (ref 26.6–33.0)
MCHC: 31.7 g/dL (ref 31.5–35.7)
MCV: 70 fL — ABNORMAL LOW (ref 79–97)
Monocytes Absolute: 0.3 10*3/uL (ref 0.1–0.9)
Monocytes: 5 %
Neutrophils Absolute: 3.9 10*3/uL (ref 1.4–7.0)
Neutrophils: 64 %
Platelets: 276 10*3/uL (ref 150–379)
RBC: 3.81 x10E6/uL (ref 3.77–5.28)
RDW: 17.5 % — ABNORMAL HIGH (ref 12.3–15.4)
WBC: 6.1 10*3/uL (ref 3.4–10.8)

## 2017-05-20 LAB — COMPREHENSIVE METABOLIC PANEL
A/G RATIO: 1.5 (ref 1.2–2.2)
ALBUMIN: 4 g/dL (ref 3.5–5.5)
ALK PHOS: 78 IU/L (ref 39–117)
ALT: 8 IU/L (ref 0–32)
AST: 9 IU/L (ref 0–40)
BILIRUBIN TOTAL: 0.3 mg/dL (ref 0.0–1.2)
BUN / CREAT RATIO: 14 (ref 9–23)
BUN: 11 mg/dL (ref 6–20)
CHLORIDE: 105 mmol/L (ref 96–106)
CO2: 24 mmol/L (ref 20–29)
Calcium: 8.4 mg/dL — ABNORMAL LOW (ref 8.7–10.2)
Creatinine, Ser: 0.81 mg/dL (ref 0.57–1.00)
GFR calc non Af Amer: 92 mL/min/{1.73_m2} (ref >59)
GFR, EST AFRICAN AMERICAN: 106 mL/min/{1.73_m2} (ref >59)
GLOBULIN, TOTAL: 2.7 g/dL (ref 1.5–4.5)
Glucose: 95 mg/dL (ref 65–99)
POTASSIUM: 3.6 mmol/L (ref 3.5–5.2)
SODIUM: 142 mmol/L (ref 134–144)
TOTAL PROTEIN: 6.7 g/dL (ref 6.0–8.5)

## 2017-05-20 LAB — RPR: RPR: NONREACTIVE

## 2017-05-20 LAB — IRON AND TIBC
Iron Saturation: 5 % — CL (ref 15–55)
Iron: 15 ug/dL — ABNORMAL LOW (ref 27–159)
Total Iron Binding Capacity: 298 ug/dL (ref 250–450)
UIBC: 283 ug/dL (ref 131–425)

## 2017-05-20 LAB — HEPATITIS PANEL, ACUTE
HEP A IGM: NEGATIVE
HEP B C IGM: NEGATIVE
HEP B S AG: NEGATIVE
Hep C Virus Ab: <0.1 s/co ratio (ref 0.0–0.9)

## 2017-05-20 LAB — HIV ANTIBODY (ROUTINE TESTING W REFLEX): HIV Screen 4th Generation wRfx: NONREACTIVE

## 2017-05-20 LAB — THYROID PANEL WITH TSH
FREE THYROXINE INDEX: 2.1 (ref 1.2–4.9)
T3 UPTAKE RATIO: 24 % (ref 24–39)
T4, Total: 8.6 ug/dL (ref 4.5–12.0)
TSH: 1.71 u[IU]/mL (ref 0.450–4.500)

## 2017-05-20 LAB — HSV(HERPES SIMPLEX VRS) I + II AB-IGG
HSV 1 Glycoprotein G Ab, IgG: 9.59 index — ABNORMAL HIGH (ref 0.00–0.90)
HSV 2 IgG, Type Spec: <0.91 index (ref 0.00–0.90)

## 2017-05-20 LAB — FERRITIN: Ferritin: 10 ng/mL — ABNORMAL LOW (ref 15–150)

## 2017-05-20 NOTE — Telephone Encounter (Signed)
Please let her know that all her STI screening has come back negative so far except the cold sore virus, which she already knew about. Her iron came back REALLY LOW though! 8.5! Let's have her restart her iron TID if she can tolerate it and just have her stop by here or at the hospital for a repeat blood draw in about a month. Thanks!

## 2017-05-20 NOTE — Telephone Encounter (Signed)
Detailed message left for patient.

## 2017-05-21 ENCOUNTER — Telehealth: Payer: Self-pay | Admitting: Family Medicine

## 2017-05-21 LAB — GC/CHLAMYDIA PROBE AMP
CHLAMYDIA, DNA PROBE: NEGATIVE
NEISSERIA GONORRHOEAE BY PCR: NEGATIVE

## 2017-05-21 NOTE — Telephone Encounter (Signed)
Please let her know that her GC/chlamydia came back normal.  

## 2017-05-21 NOTE — Telephone Encounter (Signed)
Detailed message left on voicemail.

## 2017-08-12 ENCOUNTER — Encounter: Payer: Self-pay | Admitting: Family Medicine

## 2017-08-21 ENCOUNTER — Ambulatory Visit: Payer: 59 | Admitting: Family Medicine

## 2017-08-21 ENCOUNTER — Encounter: Payer: Self-pay | Admitting: Family Medicine

## 2017-08-21 VITALS — BP 138/86 | HR 82 | Temp 97.8°F | Resp 16 | Ht 68.3 in | Wt 229.3 lb

## 2017-08-21 DIAGNOSIS — Z021 Encounter for pre-employment examination: Secondary | ICD-10-CM

## 2017-08-21 NOTE — Progress Notes (Signed)
BP 138/86 (BP Location: Left Arm, Patient Position: Sitting, Cuff Size: Large)   Pulse 82   Temp 97.8 F (36.6 C)   Resp 16   Ht 5' 8.3" (1.735 m)   Wt 229 lb 5 oz (104 kg)   SpO2 100%   BMI 34.56 kg/m    Subjective:    Patient ID: Rachel Salas Comment, female    DOB: 1975/10/17, 42 y.o.   MRN: [id number]  HPI: STASHIA SIA is a 42 y.o. female  Chief Complaint  Patient presents with  . Paperwork    Patient has paperwork for NP school, she needs a MMR and Varicella titer and a quantiferon gold. Patient's last CPE was 11/17/16    Relevant past medical, surgical, family and social history reviewed and updated as indicated. Interim medical history since our last visit reviewed. Allergies and medications reviewed and updated.  Review of Systems  Constitutional: Negative.   HENT: Negative.   Eyes: Negative.   Respiratory: Negative.   Cardiovascular: Negative.   Gastrointestinal: Negative.   Endocrine: Negative.   Genitourinary: Negative.   Musculoskeletal: Negative.   Skin: Negative.   Allergic/Immunologic: Negative.   Neurological: Negative.   Hematological: Negative.   Psychiatric/Behavioral: Negative.     Per HPI unless specifically indicated above     Objective:    BP 138/86 (BP Location: Left Arm, Patient Position: Sitting, Cuff Size: Large)   Pulse 82   Temp 97.8 F (36.6 C)   Resp 16   Ht 5' 8.3" (1.735 m)   Wt 229 lb 5 oz (104 kg)   SpO2 100%   BMI 34.56 kg/m   Wt Readings from Last 3 Encounters:  08/21/17 229 lb 5 oz (104 kg)  05/19/17 234 lb 5 oz (106.3 kg)  11/17/16 237 lb 12.8 oz (107.9 kg)    Physical Exam  Constitutional: She is oriented to person, place, and time. She appears well-developed and well-nourished. No distress.  HENT:  Head: Normocephalic and atraumatic.  Right Ear: Hearing and external ear normal.  Left Ear: Hearing and external ear normal.  Nose: Nose normal.  Mouth/Throat: Oropharynx is clear and moist. No oropharyngeal  exudate.  Eyes: Conjunctivae, EOM and lids are normal. Pupils are equal, round, and reactive to light. Right eye exhibits no discharge. Left eye exhibits no discharge. No scleral icterus.  Neck: Normal range of motion. Neck supple. No JVD present. No tracheal deviation present. No thyromegaly present.  Cardiovascular: Normal rate, regular rhythm, normal heart sounds and intact distal pulses. Exam reveals no gallop and no friction rub.  No murmur heard. Pulmonary/Chest: Effort normal and breath sounds normal. No stridor. No respiratory distress. She has no wheezes. She has no rales. She exhibits no tenderness.  Abdominal: Soft. Bowel sounds are normal. She exhibits no distension and no mass. There is no tenderness. There is no rebound and no guarding.  Musculoskeletal: Normal range of motion. She exhibits no edema, tenderness or deformity.  Lymphadenopathy:    She has no cervical adenopathy.  Neurological: She is alert and oriented to person, place, and time. She has normal reflexes. She displays normal reflexes. No cranial nerve deficit. She exhibits normal muscle tone. Coordination normal.  Skin: Skin is warm, dry and intact. No rash noted. She is not diaphoretic. No erythema. No pallor.  Psychiatric: She has a normal mood and affect. Her speech is normal and behavior is normal. Judgment and thought content normal. Cognition and memory are normal.  Nursing note and vitals reviewed.  Results for orders placed or performed in visit on 05/19/17  GC/Chlamydia Probe Amp  Result Value Ref Range   Chlamydia trachomatis, NAA Negative Negative   Neisseria gonorrhoeae by PCR Negative Negative  CBC with Differential/Platelet  Result Value Ref Range   WBC 6.1 3.4 - 10.8 x10E3/uL   RBC 3.81 3.77 - 5.28 x10E6/uL   Hemoglobin 8.5 (L) 11.1 - 15.9 g/dL   Hematocrit 26.8 (L) 34.0 - 46.6 %   MCV 70 (L) 79 - 97 fL   MCH 22.3 (L) 26.6 - 33.0 pg   MCHC 31.7 31.5 - 35.7 g/dL   RDW 17.5 (H) 12.3 - 15.4 %    Platelets 276 150 - 379 x10E3/uL   Neutrophils 64 Not Estab. %   Lymphs 29 Not Estab. %   Monocytes 5 Not Estab. %   Eos 2 Not Estab. %   Basos 0 Not Estab. %   Neutrophils Absolute 3.9 1.4 - 7.0 x10E3/uL   Lymphocytes Absolute 1.8 0.7 - 3.1 x10E3/uL   Monocytes Absolute 0.3 0.1 - 0.9 x10E3/uL   EOS (ABSOLUTE) 0.1 0.0 - 0.4 x10E3/uL   Basophils Absolute 0.0 0.0 - 0.2 x10E3/uL   Immature Granulocytes 0 Not Estab. %   Immature Grans (Abs) 0.0 0.0 - 0.1 x10E3/uL  Comprehensive metabolic panel  Result Value Ref Range   Glucose 95 65 - 99 mg/dL   BUN 11 6 - 20 mg/dL   Creatinine, Ser 0.81 0.57 - 1.00 mg/dL   GFR calc non Af Amer 92 >59 mL/min/1.73   GFR calc Af Amer 106 >59 mL/min/1.73   BUN/Creatinine Ratio 14 9 - 23   Sodium 142 134 - 144 mmol/L   Potassium 3.6 3.5 - 5.2 mmol/L   Chloride 105 96 - 106 mmol/L   CO2 24 20 - 29 mmol/L   Calcium 8.4 (L) 8.7 - 10.2 mg/dL   Total Protein 6.7 6.0 - 8.5 g/dL   Albumin 4.0 3.5 - 5.5 g/dL   Globulin, Total 2.7 1.5 - 4.5 g/dL   Albumin/Globulin Ratio 1.5 1.2 - 2.2   Bilirubin Total 0.3 0.0 - 1.2 mg/dL   Alkaline Phosphatase 78 39 - 117 IU/L   AST 9 0 - 40 IU/L   ALT 8 0 - 32 IU/L  Iron and TIBC  Result Value Ref Range   Total Iron Binding Capacity 298 250 - 450 ug/dL   UIBC 283 131 - 425 ug/dL   Iron 15 (L) 27 - 159 ug/dL   Iron Saturation 5 (LL) 15 - 55 %  Ferritin  Result Value Ref Range   Ferritin 10 (L) 15 - 150 ng/mL  Thyroid Panel With TSH  Result Value Ref Range   TSH 1.710 0.450 - 4.500 uIU/mL   T4, Total 8.6 4.5 - 12.0 ug/dL   T3 Uptake Ratio 24 24 - 39 %   Free Thyroxine Index 2.1 1.2 - 4.9  Hepatitis panel, acute  Result Value Ref Range   Hep A IgM Negative Negative   Hepatitis B Surface Ag Negative Negative   Hep B C IgM Negative Negative   Hep C Virus Ab <0.1 0.0 - 0.9 s/co ratio  HSV(herpes simplex vrs) 1+2 ab-IgG  Result Value Ref Range   HSV 1 Glycoprotein G Ab, IgG 9.59 (H) 0.00 - 0.90 index   HSV 2 IgG,  Type Spec <0.91 0.00 - 0.90 index  RPR  Result Value Ref Range   RPR Ser Ql Non Reactive Non Reactive  HIV antibody  Result Value Ref Range   HIV Screen 4th Generation wRfx Non Reactive Non Reactive      Assessment & Plan:   Problem List Items Addressed This Visit    None    Visit Diagnoses    Pre-employment examination    -  Primary   Doing well. Form filled out. Call with any concerns. Labs drawn.    Relevant Orders   Measles/Mumps/Rubella Immunity   Varicella Zoster Abs, IgG/IgM   QuantiFERON-TB Gold Plus       Follow up plan: Return As scheduled for physical.

## 2017-08-25 ENCOUNTER — Encounter: Payer: Self-pay | Admitting: Family Medicine

## 2017-08-25 LAB — QUANTIFERON-TB GOLD PLUS
QUANTIFERON NIL VALUE: 0.03 [IU]/mL
QUANTIFERON TB2 AG VALUE: 0.03 [IU]/mL
QuantiFERON TB1 Ag Value: 0.03 IU/mL
QuantiFERON-TB Gold Plus: NEGATIVE

## 2017-08-25 LAB — MEASLES/MUMPS/RUBELLA IMMUNITY
MUMPS ABS, IGG: 50.1 [AU]/ml (ref >10.9)
Rubella Antibodies, IGG: 1.47 index (ref >0.99)

## 2017-08-25 LAB — VARICELLA ZOSTER ABS, IGG/IGM
Varicella IgM: <0.91 index (ref 0.00–0.90)
Varicella zoster IgG: 2061 index (ref >165)

## 2017-09-28 ENCOUNTER — Encounter: Payer: Self-pay | Admitting: Family Medicine

## 2017-11-19 ENCOUNTER — Encounter: Payer: 59 | Admitting: Family Medicine

## 2017-11-20 ENCOUNTER — Ambulatory Visit (INDEPENDENT_AMBULATORY_CARE_PROVIDER_SITE_OTHER): Payer: 59 | Admitting: Family Medicine

## 2017-11-20 ENCOUNTER — Encounter: Payer: Self-pay | Admitting: Family Medicine

## 2017-11-20 VITALS — BP 124/86 | HR 82 | Temp 98.2°F | Wt 226.1 lb

## 2017-11-20 DIAGNOSIS — Z23 Encounter for immunization: Secondary | ICD-10-CM

## 2017-11-20 DIAGNOSIS — E876 Hypokalemia: Secondary | ICD-10-CM

## 2017-11-20 DIAGNOSIS — D500 Iron deficiency anemia secondary to blood loss (chronic): Secondary | ICD-10-CM

## 2017-11-20 DIAGNOSIS — E6609 Other obesity due to excess calories: Secondary | ICD-10-CM | POA: Diagnosis not present

## 2017-11-20 DIAGNOSIS — Z6834 Body mass index (BMI) 34.0-34.9, adult: Secondary | ICD-10-CM | POA: Diagnosis not present

## 2017-11-20 DIAGNOSIS — Z0000 Encounter for general adult medical examination without abnormal findings: Secondary | ICD-10-CM | POA: Diagnosis not present

## 2017-11-20 DIAGNOSIS — K219 Gastro-esophageal reflux disease without esophagitis: Secondary | ICD-10-CM

## 2017-11-20 LAB — MICROSCOPIC EXAMINATION: BACTERIA UA: NONE SEEN

## 2017-11-20 LAB — UA/M W/RFLX CULTURE, ROUTINE
Bilirubin, UA: NEGATIVE
Glucose, UA: NEGATIVE
NITRITE UA: NEGATIVE
PH UA: 6 (ref 5.0–7.5)
Specific Gravity, UA: 1.02 (ref 1.005–1.030)
Urobilinogen, Ur: 1 mg/dL (ref 0.2–1.0)

## 2017-11-20 MED ORDER — NALTREXONE-BUPROPION HCL ER 8-90 MG PO TB12: ORAL_TABLET | ORAL | 6 refills | Status: DC

## 2017-11-20 MED ORDER — VALACYCLOVIR HCL 1 G PO TABS: 2000.0000 mg | ORAL_TABLET | Freq: Two times a day (BID) | ORAL | 12 refills | Status: DC

## 2017-11-20 NOTE — Assessment & Plan Note (Signed)
Doing great! Down 48lbs! Tolerating contrave well. Continue current regimen. Continue to monitor.

## 2017-11-20 NOTE — Progress Notes (Signed)
BP 124/86 (BP Location: Left Arm, Patient Position: Sitting, Cuff Size: Normal)   Pulse 82   Temp 98.2 F (36.8 C)   Wt 226 lb 1 oz (102.5 kg)   SpO2 99%   BMI 34.07 kg/m    Subjective:    Patient ID: Rachel Salas, female    DOB: [birth year], 42 y.o.   MRN: [id number]  HPI: Rachel Salas is a 42 y.o. female presenting on 11/20/2017 for comprehensive medical examination. Current medical complaints include:  WEIGHT GAIN- has been on contrave Duration: chronic Previous attempts at weight loss: yes Complications of obesity: HTN, psudeotumor-cerebrii Peak weight: 274 Weight loss goal: to be healthy Weight loss to date: 48lbs!! Requesting obesity pharmacotherapy: yes Current weight loss supplements/medications: yes Previous weight loss supplements/meds: yes- has been on contrave  ANEMIA Anemia status: stable Etiology of anemia: Iron deficiency Duration of anemia treatment: chronic Compliance with treatment: good compliance Iron supplementation side effects: no Severity of anemia: moderate Fatigue: yes Decreased exercise tolerance: no  Dyspnea on exertion: no Palpitations: no Bleeding: no Pica: no  Menopausal Symptoms: no  Depression Screen done today and results listed below:  Depression screen [facility] 2/9 11/20/2017 11/17/2016 04/07/2016  Decreased Interest 0 0 0  Down, Depressed, Hopeless 0 0 0  PHQ - 2 Score 0 0 0  Altered sleeping 0 - -  Tired, decreased energy 1 - -  Change in appetite 0 - -  Feeling bad or failure about yourself  0 - -  Trouble concentrating 0 - -  Moving slowly or fidgety/restless 0 - -  Suicidal thoughts 0 - -  PHQ-9 Score 1 - -  Difficult doing work/chores Not difficult at all - -    Past Medical History:  Past Medical History:  Diagnosis Date  . Adenomyosis 11/15/2014   u/s suspicious for adenomyosis  . Essential hypertension, benign 10/14/2012  . GERD (gastroesophageal reflux disease)   . Hypertension   . Hypokalemia   . IDA (iron  deficiency anemia)   . Menorrhagia with irregular cycle   . Pseudotumor cerebri dx 2007  . Severe obesity (HCC)     Surgical History:  Past Surgical History:  Procedure Laterality Date  . TONSILLECTOMY  2007  . TONSILLECTOMY  2007  . TUBAL LIGATION  2002    Medications:  Current Outpatient Medications on File Prior to Visit  Medication Sig  . cyclobenzaprine (FLEXERIL) 10 MG tablet Take 1 tablet (10 mg total) by mouth at bedtime.  . ondansetron (ZOFRAN ODT) 8 MG disintegrating tablet Take 1 tablet (8 mg total) by mouth every 8 (eight) hours as needed for nausea or vomiting.   No current facility-administered medications on file prior to visit.     Allergies:  Allergies  Allergen Reactions  . Tylenol [Acetaminophen] Hives    Social History:  Social History   Socioeconomic History  . Marital status: Married    Spouse name: Not on file  . Number of children: 3  . Years of education: BS  . Highest education level: Not on file  Occupational History    Employer: [employer]  Social Needs  . Financial resource strain: Not on file  . Food insecurity:    Worry: Not on file    Inability: Not on file  . Transportation needs:    Medical: Not on file    Non-medical: Not on file  Tobacco Use  . Smoking status: Never Smoker  . Smokeless tobacco: Never Used  Substance and Sexual  Activity  . Alcohol use: Yes    Alcohol/week: 0.0 oz    Comment: Rare/Occasional  . Drug use: No  . Sexual activity: Yes    Partners: Male    Birth control/protection: None, Surgical    Comment: BTL  Lifestyle  . Physical activity:    Days per week: Not on file    Minutes per session: Not on file  . Stress: Not on file  Relationships  . Social connections:    Talks on phone: Not on file    Gets together: Not on file    Attends religious service: Not on file    Active member of club or organization: Not on file    Attends meetings of clubs or organizations: Not on file    Relationship  status: Not on file  . Intimate partner violence:    Fear of current or ex partner: Not on file    Emotionally abused: Not on file    Physically abused: Not on file    Forced sexual activity: Not on file  Other Topics Concern  . Not on file  Social History Narrative   2-3 caffeine drinks a week    Social History   Tobacco Use  Smoking Status Never Smoker  Smokeless Tobacco Never Used   Social History   Substance and Sexual Activity  Alcohol Use Yes  . Alcohol/week: 0.0 oz   Comment: Rare/Occasional    Family History:  Family History  Problem Relation Age of Onset  . Hypertension Mother   . Hypercholesterolemia Mother   . Thyroid disease Mother   . Stroke Maternal Grandmother   . Colon cancer Neg Hx   . Breast cancer Neg Hx   . Ovarian cancer Neg Hx   . Heart disease Neg Hx     Past medical history, surgical history, medications, allergies, family history and social history reviewed with patient today and changes made to appropriate areas of the chart.   Review of Systems  Constitutional: Negative.   HENT: Negative.   Eyes: Negative.   Respiratory: Negative.   Cardiovascular: Positive for chest pain (linked to stress- comes and goes). Negative for palpitations, orthopnea, claudication, leg swelling and PND.  Gastrointestinal: Positive for constipation. Negative for abdominal pain, blood in stool, diarrhea, heartburn, melena, nausea and vomiting.  Genitourinary: Negative.   Musculoskeletal: Negative.   Skin: Negative.   Neurological: Negative.   Endo/Heme/Allergies: Negative.   Psychiatric/Behavioral: Negative for depression, hallucinations, memory loss, substance abuse and suicidal ideas. The patient is nervous/anxious. The patient does not have insomnia.     All other ROS negative except what is listed above and in the HPI.      Objective:    BP 124/86 (BP Location: Left Arm, Patient Position: Sitting, Cuff Size: Normal)   Pulse 82   Temp 98.2 F (36.8  C)   Wt 226 lb 1 oz (102.5 kg)   SpO2 99%   BMI 34.07 kg/m   Wt Readings from Last 3 Encounters:  11/20/17 226 lb 1 oz (102.5 kg)  08/21/17 229 lb 5 oz (104 kg)  05/19/17 234 lb 5 oz (106.3 kg)    Physical Exam  Constitutional: She is oriented to person, place, and time. She appears well-developed and well-nourished. No distress.  HENT:  Head: Normocephalic and atraumatic.  Right Ear: Hearing, tympanic membrane, external ear and ear canal normal.  Left Ear: Hearing, tympanic membrane, external ear and ear canal normal.  Nose: Nose normal.  Mouth/Throat: Uvula is midline,  oropharynx is clear and moist and mucous membranes are normal. No oropharyngeal exudate.  Eyes: Pupils are equal, round, and reactive to light. Conjunctivae, EOM and lids are normal. Right eye exhibits no discharge. Left eye exhibits no discharge. No scleral icterus.  Neck: Normal range of motion. Neck supple. No JVD present. No tracheal deviation present. No thyromegaly present.  Cardiovascular: Normal rate, regular rhythm, normal heart sounds and intact distal pulses. Exam reveals no gallop and no friction rub.  No murmur heard. Pulmonary/Chest: Effort normal and breath sounds normal. No stridor. No respiratory distress. She has no wheezes. She has no rales. She exhibits no tenderness. Right breast exhibits no inverted nipple, no mass, no nipple discharge, no skin change and no tenderness. Left breast exhibits no inverted nipple, no mass, no nipple discharge, no skin change and no tenderness. No breast swelling, tenderness, discharge or bleeding. Breasts are symmetrical.  Abdominal: Soft. Bowel sounds are normal. She exhibits no distension and no mass. There is no tenderness. There is no rebound and no guarding. No hernia.  Genitourinary:  Genitourinary Comments: Pelvic exam deferred with shared decision making.  Musculoskeletal: Normal range of motion. She exhibits no edema, tenderness or deformity.  Lymphadenopathy:      She has no cervical adenopathy.  Neurological: She is alert and oriented to person, place, and time. She displays normal reflexes. No cranial nerve deficit or sensory deficit. She exhibits normal muscle tone. Coordination normal.  Skin: Skin is warm, dry and intact. Capillary refill takes less than 2 seconds. No rash noted. She is not diaphoretic. No erythema. No pallor.  Psychiatric: She has a normal mood and affect. Her speech is normal and behavior is normal. Judgment and thought content normal. Cognition and memory are normal.  Nursing note and vitals reviewed.   Results for orders placed or performed in visit on 08/21/17  Measles/Mumps/Rubella Immunity  Result Value Ref Range   Rubella Antibodies, IGG 1.47 Immune >0.99 index   RUBEOLA AB, IGG >300.0 Immune >29.9 AU/mL   MUMPS ABS, IGG 50.1 Immune >10.9 AU/mL  Varicella Zoster Abs, IgG/IgM  Result Value Ref Range   Varicella zoster IgG 2,061 Immune >165 index   Varicella IgM <0.91 0.00 - 0.90 index  QuantiFERON-TB Gold Plus  Result Value Ref Range   QuantiFERON Incubation Incubation performed.    QuantiFERON Criteria Comment    QuantiFERON TB1 Ag Value 0.03 IU/mL   QuantiFERON TB2 Ag Value 0.03 IU/mL   QuantiFERON Nil Value 0.03 IU/mL   QuantiFERON Mitogen Value >10.00 IU/mL   QuantiFERON-TB Gold Plus Negative Negative      Assessment & Plan:   Problem List Items Addressed This Visit      Digestive   GERD (gastroesophageal reflux disease)    Stable. Continue to monitor. Take OTC meds as needed. Call with any concerns.       Relevant Orders   Comprehensive metabolic panel     Other   Anemia    Rechecking levels today. Continue to monitor. Call with any concerns.       Relevant Orders   CBC with Differential/Platelet   Comprehensive metabolic panel   Iron and TIBC   Ferritin   Hypokalemia    Rechecking levels today. Await results.       Relevant Orders   Comprehensive metabolic panel   Obesity    Doing  great! Down 48lbs! Tolerating contrave well. Continue current regimen. Continue to monitor.       Relevant Medications   Naltrexone-buPROPion HCl  ER (CONTRAVE) 8-90 MG TB12   Other Relevant Orders   Comprehensive metabolic panel    Other Visit Diagnoses    Routine general medical examination at a health care facility    -  Primary   Vaccines updated. Screening labs checked today. Continue diet and exercise. Pap up to date. Call with any concerns.   Relevant Orders   CBC with Differential/Platelet   Comprehensive metabolic panel   Lipid Panel w/o Chol/HDL Ratio   TSH   UA/M w/rflx Culture, Routine   Need for HPV vaccine       Vaccine given today. Will return in 2 months and 6 months to complete the series.   Relevant Orders   HPV 9-valent vaccine,Recombinat (Completed)   HPV 9-valent vaccine,Recombinat       Follow up plan: Return 2 month for vaccine, 6 months for follow up contrave.   LABORATORY TESTING:  - Pap smear: up to date  IMMUNIZATIONS:   - Tdap: Tetanus vaccination status reviewed: last tetanus booster within 10 years. - Influenza: Up to date - Pneumovax: Not applicable - Prevnar: Not applicable - HPV: Administered today - Zostavax vaccine: Not applicable  PATIENT COUNSELING:   Advised to take 1 mg of folate supplement per day if capable of pregnancy.   Sexuality: Discussed sexually transmitted diseases, partner selection, use of condoms, avoidance of unintended pregnancy  and contraceptive alternatives.   Advised to avoid cigarette smoking.  I discussed with the patient that most people either abstain from alcohol or drink within safe limits (<=14/week and <=4 drinks/occasion for males, <=7/weeks and <= 3 drinks/occasion for females) and that the risk for alcohol disorders and other health effects rises proportionally with the number of drinks per week and how often a drinker exceeds daily limits.  Discussed cessation/primary prevention of drug use and  availability of treatment for abuse.   Diet: Encouraged to adjust caloric intake to maintain  or achieve ideal body weight, to reduce intake of dietary saturated fat and total fat, to limit sodium intake by avoiding high sodium foods and not adding table salt, and to maintain adequate dietary potassium and calcium preferably from fresh fruits, vegetables, and low-fat dairy products.    stressed the importance of regular exercise  Injury prevention: Discussed safety belts, safety helmets, smoke detector, smoking near bedding or upholstery.   Dental health: Discussed importance of regular tooth brushing, flossing, and dental visits.    NEXT PREVENTATIVE PHYSICAL DUE IN 1 YEAR. Return 2 month for vaccine, 6 months for follow up contrave.

## 2017-11-20 NOTE — Assessment & Plan Note (Signed)
Stable. Continue to monitor. Take OTC meds as needed. Call with any concerns.

## 2017-11-20 NOTE — Assessment & Plan Note (Signed)
Rechecking levels today. Await results.  

## 2017-11-20 NOTE — Patient Instructions (Signed)
Health Maintenance, Female Adopting a healthy lifestyle and getting preventive care can go a long way to promote health and wellness. Talk with your health care provider about what schedule of regular examinations is right for you. This is a good chance for you to check in with your provider about disease prevention and staying healthy. In between checkups, there are plenty of things you can do on your own. Experts have done a lot of research about which lifestyle changes and preventive measures are most likely to keep you healthy. Ask your health care provider for more information. Weight and diet Eat a healthy diet  Be sure to include plenty of vegetables, fruits, low-fat dairy products, and lean protein.  Do not eat a lot of foods high in solid fats, added sugars, or salt.  Get regular exercise. This is one of the most important things you can do for your health. ? Most adults should exercise for at least 150 minutes each week. The exercise should increase your heart rate and make you sweat (moderate-intensity exercise). ? Most adults should also do strengthening exercises at least twice a week. This is in addition to the moderate-intensity exercise.  Maintain a healthy weight  Body mass index (BMI) is a measurement that can be used to identify possible weight problems. It estimates body fat based on height and weight. Your health care provider can help determine your BMI and help you achieve or maintain a healthy weight.  For females 20 years of age and older: ? A BMI below 18.5 is considered underweight. ? A BMI of 18.5 to 24.9 is normal. ? A BMI of 25 to 29.9 is considered overweight. ? A BMI of 30 and above is considered obese.  Watch levels of cholesterol and blood lipids  You should start having your blood tested for lipids and cholesterol at 40 years of age, then have this test every 5 years.  You may need to have your cholesterol levels checked more often if: ? Your lipid or  cholesterol levels are high. ? You are older than 40 years of age. ? You are at high risk for heart disease.  Cancer screening Lung Cancer  Lung cancer screening is recommended for adults 55-80 years old who are at high risk for lung cancer because of a history of smoking.  A yearly low-dose CT scan of the lungs is recommended for people who: ? Currently smoke. ? Have quit within the past 15 years. ? Have at least a 30-pack-year history of smoking. A pack year is smoking an average of one pack of cigarettes a day for 1 year.  Yearly screening should continue until it has been 15 years since you quit.  Yearly screening should stop if you develop a health problem that would prevent you from having lung cancer treatment.  Breast Cancer  Practice breast self-awareness. This means understanding how your breasts normally appear and feel.  It also means doing regular breast self-exams. Let your health care provider know about any changes, no matter how small.  If you are in your 20s or 30s, you should have a clinical breast exam (CBE) by a health care provider every 1-3 years as part of a regular health exam.  If you are 40 or older, have a CBE every year. Also consider having a breast X-ray (mammogram) every year.  If you have a family history of breast cancer, talk to your health care provider about genetic screening.  If you are at high risk   for breast cancer, talk to your health care provider about having an MRI and a mammogram every year.  Breast cancer gene (BRCA) assessment is recommended for women who have family members with BRCA-related cancers. BRCA-related cancers include: ? Breast. ? Ovarian. ? Tubal. ? Peritoneal cancers.  Results of the assessment will determine the need for genetic counseling and BRCA1 and BRCA2 testing.  Cervical Cancer Your health care provider may recommend that you be screened regularly for cancer of the pelvic organs (ovaries, uterus, and  vagina). This screening involves a pelvic examination, including checking for microscopic changes to the surface of your cervix (Pap test). You may be encouraged to have this screening done every 3 years, beginning at age 22.  For women ages 56-65, health care providers may recommend pelvic exams and Pap testing every 3 years, or they may recommend the Pap and pelvic exam, combined with testing for human papilloma virus (HPV), every 5 years. Some types of HPV increase your risk of cervical cancer. Testing for HPV may also be done on women of any age with unclear Pap test results.  Other health care providers may not recommend any screening for nonpregnant women who are considered low risk for pelvic cancer and who do not have symptoms. Ask your health care provider if a screening pelvic exam is right for you.  If you have had past treatment for cervical cancer or a condition that could lead to cancer, you need Pap tests and screening for cancer for at least 20 years after your treatment. If Pap tests have been discontinued, your risk factors (such as having a new sexual partner) need to be reassessed to determine if screening should resume. Some women have medical problems that increase the chance of getting cervical cancer. In these cases, your health care provider may recommend more frequent screening and Pap tests.  Colorectal Cancer  This type of cancer can be detected and often prevented.  Routine colorectal cancer screening usually begins at 40 years of age and continues through 40 years of age.  Your health care provider may recommend screening at an earlier age if you have risk factors for colon cancer.  Your health care provider may also recommend using home test kits to check for hidden blood in the stool.  A small camera at the end of a tube can be used to examine your colon directly (sigmoidoscopy or colonoscopy). This is done to check for the earliest forms of colorectal  cancer.  Routine screening usually begins at age 33.  Direct examination of the colon should be repeated every 5-10 years through 40 years of age. However, you may need to be screened more often if early forms of precancerous polyps or small growths are found.  Skin Cancer  Check your skin from head to toe regularly.  Tell your health care provider about any new moles or changes in moles, especially if there is a change in a mole's shape or color.  Also tell your health care provider if you have a mole that is larger than the size of a pencil eraser.  Always use sunscreen. Apply sunscreen liberally and repeatedly throughout the day.  Protect yourself by wearing long sleeves, pants, a wide-brimmed hat, and sunglasses whenever you are outside.  Heart disease, diabetes, and high blood pressure  High blood pressure causes heart disease and increases the risk of stroke. High blood pressure is more likely to develop in: ? People who have blood pressure in the high end of  the normal range (130-139/85-89 mm Hg). ? People who are overweight or obese. ? People who are African American.  If you are 21-29 years of age, have your blood pressure checked every 3-5 years. If you are 3 years of age or older, have your blood pressure checked every year. You should have your blood pressure measured twice-once when you are at a hospital or clinic, and once when you are not at a hospital or clinic. Record the average of the two measurements. To check your blood pressure when you are not at a hospital or clinic, you can use: ? An automated blood pressure machine at a pharmacy. ? A home blood pressure monitor.  If you are between 17 years and 37 years old, ask your health care provider if you should take aspirin to prevent strokes.  Have regular diabetes screenings. This involves taking a blood sample to check your fasting blood sugar level. ? If you are at a normal weight and have a low risk for diabetes,  have this test once every three years after 40 years of age. ? If you are overweight and have a high risk for diabetes, consider being tested at a younger age or more often. Preventing infection Hepatitis B  If you have a higher risk for hepatitis B, you should be screened for this virus. You are considered at high risk for hepatitis B if: ? You were born in a country where hepatitis B is common. Ask your health care provider which countries are considered high risk. ? Your parents were born in a high-risk country, and you have not been immunized against hepatitis B (hepatitis B vaccine). ? You have HIV or AIDS. ? You use needles to inject street drugs. ? You live with someone who has hepatitis B. ? You have had sex with someone who has hepatitis B. ? You get hemodialysis treatment. ? You take certain medicines for conditions, including cancer, organ transplantation, and autoimmune conditions.  Hepatitis C  Blood testing is recommended for: ? Everyone born from [birth year] through 1963. ? Anyone with known risk factors for hepatitis C.  Sexually transmitted infections (STIs)  You should be screened for sexually transmitted infections (STIs) including gonorrhea and chlamydia if: ? You are sexually active and are younger than 40 years of age. ? You are older than 40 years of age and your health care provider tells you that you are at risk for this type of infection. ? Your sexual activity has changed since you were last screened and you are at an increased risk for chlamydia or gonorrhea. Ask your health care provider if you are at risk.  If you do not have HIV, but are at risk, it may be recommended that you take a prescription medicine daily to prevent HIV infection. This is called pre-exposure prophylaxis (PrEP). You are considered at risk if: ? You are sexually active and do not regularly use condoms or know the HIV status of your partner(s). ? You take drugs by injection. ? You are  sexually active with a partner who has HIV.  Talk with your health care provider about whether you are at high risk of being infected with HIV. If you choose to begin PrEP, you should first be tested for HIV. You should then be tested every 3 months for as long as you are taking PrEP. Pregnancy  If you are premenopausal and you may become pregnant, ask your health care provider about preconception counseling.  If you may become  pregnant, take 400 to 800 micrograms (mcg) of folic acid every day.  If you want to prevent pregnancy, talk to your health care provider about birth control (contraception). Osteoporosis and menopause  Osteoporosis is a disease in which the bones lose minerals and strength with aging. This can result in serious bone fractures. Your risk for osteoporosis can be identified using a bone density scan.  If you are 42 years of age or older, or if you are at risk for osteoporosis and fractures, ask your health care provider if you should be screened.  Ask your health care provider whether you should take a calcium or vitamin D supplement to lower your risk for osteoporosis.  Menopause may have certain physical symptoms and risks.  Hormone replacement therapy may reduce some of these symptoms and risks. Talk to your health care provider about whether hormone replacement therapy is right for you. Follow these instructions at home:  Schedule regular health, dental, and eye exams.  Stay current with your immunizations.  Do not use any tobacco products including cigarettes, chewing tobacco, or electronic cigarettes.  If you are pregnant, do not drink alcohol.  If you are breastfeeding, limit how much and how often you drink alcohol.  Limit alcohol intake to no more than 1 drink per day for nonpregnant women. One drink equals 12 ounces of beer, 5 ounces of wine, or 1 ounces of hard liquor.  Do not use street drugs.  Do not share needles.  Ask your health care  provider for help if you need support or information about quitting drugs.  Tell your health care provider if you often feel depressed.  Tell your health care provider if you have ever been abused or do not feel safe at home. This information is not intended to replace advice given to you by your health care provider. Make sure you discuss any questions you have with your health care provider. Document Released: 01/20/2011 Document Revised: 12/13/2015 Document Reviewed: 04/10/2015 Elsevier Interactive Patient Education  2018 [employer]. HPV (Human Papillomavirus) Vaccine: What You Need to Know 1. Why get vaccinated? HPV vaccine prevents infection with human papillomavirus (HPV) types that are associated with many cancers, including:  cervical cancer in females,  vaginal and vulvar cancers in females,  anal cancer in females and males,  throat cancer in females and males, and  penile cancer in males.  In addition, HPV vaccine prevents infection with HPV types that cause genital warts in both females and males. In the [place] about 12,000 women get cervical cancer every year, and about 4,000 women die from it. HPV vaccine can prevent most of these cases of cervical cancer. Vaccination is not a substitute for cervical cancer screening. This vaccine does not protect against all HPV types that can cause cervical cancer. Women should still get regular Pap tests. HPV infection usually comes from sexual contact, and most people will become infected at some point in their life. About 14 million Americans, including teens, get infected every year. Most infections will go away on their own and not cause serious problems. But thousands of women and men get cancer and other diseases from HPV. 2. HPV vaccine HPV vaccine is approved by FDA and is recommended by CDC for both males and females. It is routinely given at 30 or 40 years of age, but it may be given beginning at age 6 years through age 53  years. Most adolescents 9 through 40 years of age should get HPV vaccine as a  two-dose series with the doses separated by 6-12 months. People who start HPV vaccination at 28 years of age and older should get the vaccine as a three-dose series with the second dose given 1-2 months after the first dose and the third dose given 6 months after the first dose. There are several exceptions to these age recommendations. Your health care provider can give you more information. 3. Some people should not get this vaccine  Anyone who has had a severe (life-threatening) allergic reaction to a dose of HPV vaccine should not get another dose.  Anyone who has a severe (life threatening) allergy to any component of HPV vaccine should not get the vaccine.  Tell your doctor if you have any severe allergies that you know of, including a severe allergy to yeast.  HPV vaccine is not recommended for pregnant women. If you learn that you were pregnant when you were vaccinated, there is no reason to expect any problems for you or your baby. Any woman who learns she was pregnant when she got HPV vaccine is encouraged to contact the manufacturer's registry for HPV vaccination during pregnancy at (732)506-0493. Women who are breastfeeding may be vaccinated.  If you have a mild illness, such as a cold, you can probably get the vaccine today. If you are moderately or severely ill, you should probably wait until you recover. Your doctor can advise you. 4. Risks of a vaccine reaction With any medicine, including vaccines, there is a chance of side effects. These are usually mild and go away on their own, but serious reactions are also possible. Most people who get HPV vaccine do not have any serious problems with it. Mild or moderate problems following HPV vaccine:  Reactions in the arm where the shot was given: ? Soreness (about 9 people in 10) ? Redness or swelling (about 1 person in 3)  Fever: ? Mild (100F) (about 1  person in 10) ? Moderate (102F) (about 1 person in 69)  Other problems: ? Headache (about 1 person in 3) Problems that could happen after any injected vaccine:  People sometimes faint after a medical procedure, including vaccination. Sitting or lying down for about 15 minutes can help prevent fainting, and injuries caused by a fall. Tell your doctor if you feel dizzy, or have vision changes or ringing in the ears.  Some people get severe pain in the shoulder and have difficulty moving the arm where a shot was given. This happens very rarely.  Any medication can cause a severe allergic reaction. Such reactions from a vaccine are very rare, estimated at about 1 in a million doses, and would happen within a few minutes to a few hours after the vaccination. As with any medicine, there is a very remote chance of a vaccine causing a serious injury or death. The safety of vaccines is always being monitored. For more information, visit: http://www.aguilar.org/. 5. What if there is a serious reaction? What should I look for? Look for anything that concerns you, such as signs of a severe allergic reaction, very high fever, or unusual behavior. Signs of a severe allergic reaction can include hives, swelling of the face and throat, difficulty breathing, a fast heartbeat, dizziness, and weakness. These would usually start a few minutes to a few hours after the vaccination. What should I do? If you think it is a severe allergic reaction or other emergency that can't wait, call 9-1-1 or get to the nearest hospital. Otherwise, call your doctor. Afterward, the  reaction should be reported to the Vaccine Adverse Event Reporting System (VAERS). Your doctor should file this report, or you can do it yourself through the VAERS web site at www.vaers.SamedayNews.es, or by calling 765 724 1766. VAERS does not give medical advice. 6. The National Vaccine Injury Compensation Program The [employer] Vaccine Injury  Compensation Program (VICP) is a federal program that was created to compensate people who may have been injured by certain vaccines. Persons who believe they may have been injured by a vaccine can learn about the program and about filing a claim by calling 763-660-1251 or visiting the [place] website at GoldCloset.com.ee. There is a time limit to file a claim for compensation. 7. How can I learn more?  Ask your health care provider. He or she can give you the vaccine package insert or suggest other sources of information.  Call your local or state health department.  Contact the Centers for Disease Control and Prevention (CDC): ? Call 629-412-3566 (1-800-CDC-INFO) or ? Visit CDC's website at http://sweeney-todd.com/ Vaccine Information Statement, HPV Vaccine (06/22/2015) This information is not intended to replace advice given to you by your health care provider. Make sure you discuss any questions you have with your health care provider. Document Released: 02/01/2014 Document Revised: 03/27/2016 Document Reviewed: 03/27/2016 Elsevier Interactive Patient Education  2017 [employer].

## 2017-11-20 NOTE — Assessment & Plan Note (Signed)
Rechecking levels today. Continue to monitor. Call with any concerns.  

## 2017-11-21 LAB — CBC WITH DIFFERENTIAL/PLATELET
BASOS: 0 %
Basophils Absolute: 0 10*3/uL (ref 0.0–0.2)
EOS (ABSOLUTE): 0 10*3/uL (ref 0.0–0.4)
Eos: 1 %
HEMOGLOBIN: 8 g/dL — AB (ref 11.1–15.9)
Hematocrit: 25.6 % — ABNORMAL LOW (ref 34.0–46.6)
IMMATURE GRANS (ABS): 0 10*3/uL (ref 0.0–0.1)
Immature Granulocytes: 0 %
LYMPHS: 24 %
Lymphocytes Absolute: 1.4 10*3/uL (ref 0.7–3.1)
MCH: 21.1 pg — ABNORMAL LOW (ref 26.6–33.0)
MCHC: 31.3 g/dL — ABNORMAL LOW (ref 31.5–35.7)
MCV: 68 fL — AB (ref 79–97)
MONOCYTES: 6 %
Monocytes Absolute: 0.4 10*3/uL (ref 0.1–0.9)
NEUTROS ABS: 4.2 10*3/uL (ref 1.4–7.0)
Neutrophils: 69 %
Platelets: 294 10*3/uL (ref 150–379)
RBC: 3.79 x10E6/uL (ref 3.77–5.28)
RDW: 19.7 % — AB (ref 12.3–15.4)
WBC: 6 10*3/uL (ref 3.4–10.8)

## 2017-11-21 LAB — IRON AND TIBC
IRON SATURATION: 6 % — AB (ref 15–55)
IRON: 19 ug/dL — AB (ref 27–159)
TIBC: 320 ug/dL (ref 250–450)
UIBC: 301 ug/dL (ref 131–425)

## 2017-11-21 LAB — COMPREHENSIVE METABOLIC PANEL
ALT: 5 IU/L (ref 0–32)
AST: 10 IU/L (ref 0–40)
Albumin/Globulin Ratio: 1.4 (ref 1.2–2.2)
Albumin: 3.9 g/dL (ref 3.5–5.5)
Alkaline Phosphatase: 79 IU/L (ref 39–117)
BUN/Creatinine Ratio: 10 (ref 9–23)
BUN: 8 mg/dL (ref 6–20)
Bilirubin Total: 0.4 mg/dL (ref 0.0–1.2)
CALCIUM: 8.8 mg/dL (ref 8.7–10.2)
CO2: 22 mmol/L (ref 20–29)
Chloride: 108 mmol/L — ABNORMAL HIGH (ref 96–106)
Creatinine, Ser: 0.83 mg/dL (ref 0.57–1.00)
GFR calc Af Amer: 103 mL/min/{1.73_m2} (ref >59)
GFR, EST NON AFRICAN AMERICAN: 89 mL/min/{1.73_m2} (ref >59)
GLOBULIN, TOTAL: 2.8 g/dL (ref 1.5–4.5)
Glucose: 87 mg/dL (ref 65–99)
Potassium: 3.5 mmol/L (ref 3.5–5.2)
SODIUM: 143 mmol/L (ref 134–144)
Total Protein: 6.7 g/dL (ref 6.0–8.5)

## 2017-11-21 LAB — LIPID PANEL W/O CHOL/HDL RATIO
Cholesterol, Total: 158 mg/dL (ref 100–199)
HDL: 56 mg/dL (ref >39)
LDL Calculated: 92 mg/dL (ref 0–99)
TRIGLYCERIDES: 48 mg/dL (ref 0–149)
VLDL Cholesterol Cal: 10 mg/dL (ref 5–40)

## 2017-11-21 LAB — FERRITIN: Ferritin: 7 ng/mL — ABNORMAL LOW (ref 15–150)

## 2017-11-21 LAB — TSH: TSH: 1.37 u[IU]/mL (ref 0.450–4.500)

## 2017-11-23 ENCOUNTER — Other Ambulatory Visit: Payer: Self-pay | Admitting: Family Medicine

## 2017-11-23 DIAGNOSIS — D500 Iron deficiency anemia secondary to blood loss (chronic): Secondary | ICD-10-CM

## 2017-11-23 MED ORDER — FERROUS SULFATE 325 (65 FE) MG PO TABS: 325.0000 mg | ORAL_TABLET | Freq: Three times a day (TID) | ORAL | 3 refills | Status: DC

## 2017-11-26 ENCOUNTER — Encounter: Payer: Self-pay | Admitting: Family Medicine

## 2017-11-26 MED ORDER — CLINDAMYCIN HCL 300 MG PO CAPS: 300.0000 mg | ORAL_CAPSULE | Freq: Two times a day (BID) | ORAL | 0 refills | Status: DC

## 2017-12-09 ENCOUNTER — Encounter: Payer: Self-pay | Admitting: Family Medicine

## 2017-12-09 MED ORDER — FLUCONAZOLE 150 MG PO TABS: 150.0000 mg | ORAL_TABLET | Freq: Once | ORAL | 0 refills | Status: AC

## 2018-01-18 ENCOUNTER — Encounter: Payer: Self-pay | Admitting: Family Medicine

## 2018-01-19 ENCOUNTER — Other Ambulatory Visit: Payer: Self-pay | Admitting: Family Medicine

## 2018-01-19 MED ORDER — CIPROFLOXACIN HCL 250 MG PO TABS: 250.0000 mg | ORAL_TABLET | Freq: Two times a day (BID) | ORAL | 0 refills | Status: DC

## 2018-01-28 ENCOUNTER — Ambulatory Visit (INDEPENDENT_AMBULATORY_CARE_PROVIDER_SITE_OTHER): Payer: 59

## 2018-01-28 DIAGNOSIS — Z23 Encounter for immunization: Secondary | ICD-10-CM | POA: Diagnosis not present

## 2018-01-28 NOTE — Patient Instructions (Signed)
Human Papillomavirus  Human papillomavirus (HPV) is the most common sexually transmitted infection (STI). It easily spreads from person to person (is highly contagious). HPV infections cause genital warts. Certain types of HPV may cause cancers, including cancer of the lower part of the uterus (cervix), vagina, outer female genital area (vulva), penis, anus, and rectum. HPV may also cause cancers of the oral cavity, such as the throat, tongue, and tonsils.  There are many types of HPV. It usually does not cause symptoms. However, sometimes there are wart-like lesions in the throat or warts in the genital area that you can see or feel. It is possible to be infected for long periods and pass HPV to others without knowing it.  What are the causes?  HPV is caused by a virus that spreads from person to person through sexual contact. This includes oral, vaginal, or anal sex.  What increases the risk?  The following factors may make you more likely to develop this condition:  · Having unprotected oral, vaginal, or anal sex.  · Having several sex partners.  · Having a sex partner who has other sex partners.  · Having or having had another STI.  · Having a weak disease-fighting (immune) system.  · Having damaged skin in the genital area.    What are the signs or symptoms?  Most people who have HPV do not have any symptoms. If symptoms are present, they may include:  · Wartlike lesions in the throat (from having oral sex).  · Warts on the infected skin or mucous membranes.  · Genital warts that may itch, burn, bleed, or be painful during sexual intercourse.    How is this diagnosed?  If wartlike lesions are present in the throat or if genital warts are present, your health care provider can usually diagnose HPV with a physical exam. Genital warts are easily seen. In females, tests may be used to diagnose HPV, including:  · A Pap test. A Pap test takes a sample of cells from your cervix to check for cancer and HPV  infection.  · An HPV test. This is similar to a Pap test and involves taking a sample of cells from your cervix.  · Using a scope to view the cervix (colposcopy). This may be done if a pelvic exam or Pap test is abnormal. A sample of tissue may be removed for testing (biopsy) during the colposcopy.    Currently, there is no test to detect HPV in males.  How is this treated?  There is no treatment for the virus itself. However, there are treatments for the health problems and symptoms HPV can cause. Your health care provider will monitor you closely after you are treated as HPV can come back and may need treatment again. Treatment for HPV may include:  · Medicines, which may be injected or applied to genital warts in a cream, lotion, liquid or gel form.  · Use of a probe to apply extreme cold (cryotherapy) to the genital warts.  · Application of an intense beam of light (laser treatment) on the genital warts.  · Use of a probe to apply extreme heat (electrocautery) on the genital warts.  · Surgery to remove the genital warts.    Follow these instructions at home:  Medicines  · Take over-the-counter and prescription medicines only as told by your health care provider. This include creams for itching or irritation.  · Do not treat genital warts with medicines used for treating hand warts.    General instructions  · Do not touch or scratch the warts.  · Do not have sex while you are being treated.  · Do not douche or use tampons during treatment (women).  · Tell your sex partner about your infection. He or she may also need to be treated.  · If you become pregnant, tell your health care provider that you have HPV. Your health care provider will monitor you closely during pregnancy to make sure your baby is safe.  · Keep all follow-up visits as told by your health care provider. This is important.  How is this prevented?  · Talk with your health care provider about getting the HPV vaccines. These vaccines prevent some HPV  infections and cancers. The vaccines are recommended for males and females between the ages of 9 and 26. They will not work if you already have HPV, and they are not recommended for pregnant women.  · After treatment, use condoms during sex to prevent future infections.  · Have only one sex partner.  · Have a sex partner who does not have other sex partners.  · Get regular Pap tests as directed by your health care provider.  Contact a health care provider if:  · The treated skin becomes red, swollen, or painful.  · You have a fever.  · You feel generally ill.  · You feel lumps or pimples sticking out in and around your genital area.  · You develop bleeding of the vagina or the treatment area.  · You have painful sexual intercourse.  Summary  · Human papillomavirus (HPV) is the most common sexually transmitted infection (STI) and is highly contagious.  · Most people carrying HPV do not have any symptoms.  · HPV can be prevented with vaccination. The vaccine is recommended for males and females between the ages of 9 and 26.  · There is no treatment for the virus itself. However, there are treatments for the health problems and symptoms HPV can cause.  This information is not intended to replace advice given to you by your health care provider. Make sure you discuss any questions you have with your health care provider.  Document Released: 09/27/2003 Document Revised: 06/15/2016 Document Reviewed: 06/15/2016  Elsevier Interactive Patient Education © 2018 [employer]

## 2018-03-03 ENCOUNTER — Telehealth: Payer: Self-pay

## 2018-03-03 NOTE — Telephone Encounter (Signed)
P.A for Shona Simpsoncontrave was initiated via telephone to [employer] at [phone]. Spoke with Raeanne GathersAshley W. Case turn around time is 24-78 hours.

## 2018-03-04 ENCOUNTER — Encounter: Payer: Self-pay | Admitting: Family Medicine

## 2018-03-10 ENCOUNTER — Encounter: Payer: Self-pay | Admitting: Family Medicine

## 2018-06-02 ENCOUNTER — Ambulatory Visit: Payer: 59

## 2018-06-09 ENCOUNTER — Ambulatory Visit: Payer: 59

## 2018-06-10 ENCOUNTER — Ambulatory Visit (INDEPENDENT_AMBULATORY_CARE_PROVIDER_SITE_OTHER): Payer: 59

## 2018-06-10 ENCOUNTER — Other Ambulatory Visit: Payer: Self-pay | Admitting: Family Medicine

## 2018-06-10 DIAGNOSIS — D500 Iron deficiency anemia secondary to blood loss (chronic): Secondary | ICD-10-CM

## 2018-06-10 DIAGNOSIS — Z23 Encounter for immunization: Secondary | ICD-10-CM

## 2018-06-10 NOTE — Patient Instructions (Signed)
HPV (Human Papillomavirus) Vaccine: What You Need to Know  1. Why get vaccinated?  HPV vaccine prevents infection with human papillomavirus (HPV) types that are associated with many cancers, including:  · cervical cancer in females,  · vaginal and vulvar cancers in females,  · anal cancer in females and males,  · throat cancer in females and males, and  · penile cancer in males.    In addition, HPV vaccine prevents infection with HPV types that cause genital warts in both females and males.  In the [place] about 12,000 women get cervical cancer every year, and about 4,000 women die from it. HPV vaccine can prevent most of these cases of cervical cancer.  Vaccination is not a substitute for cervical cancer screening. This vaccine does not protect against all HPV types that can cause cervical cancer. Women should still get regular Pap tests.  HPV infection usually comes from sexual contact, and most people will become infected at some point in their life. About 14 million Americans, including teens, get infected every year. Most infections will go away on their own and not cause serious problems. But thousands of women and men get cancer and other diseases from HPV.  2. HPV vaccine  HPV vaccine is approved by FDA and is recommended by CDC for both males and females. It is routinely given at 11 or 40 years of age, but it may be given beginning at age 9 years through age 26 years.  Most adolescents 9 through 40 years of age should get HPV vaccine as a two-dose series with the doses separated by 6-12 months. People who start HPV vaccination at 15 years of age and older should get the vaccine as a three-dose series with the second dose given 1-2 months after the first dose and the third dose given 6 months after the first dose. There are several exceptions to these age recommendations. Your health care provider can give you more information.  3. Some people should not get this vaccine   · Anyone who has had a severe (life-threatening) allergic reaction to a dose of HPV vaccine should not get another dose.  · Anyone who has a severe (life threatening) allergy to any component of HPV vaccine should not get the vaccine.  · Tell your doctor if you have any severe allergies that you know of, including a severe allergy to yeast.  · HPV vaccine is not recommended for pregnant women. If you learn that you were pregnant when you were vaccinated, there is no reason to expect any problems for you or your baby. Any woman who learns she was pregnant when she got HPV vaccine is encouraged to contact the manufacturer's registry for HPV vaccination during pregnancy at 1-800-986-8999. Women who are breastfeeding may be vaccinated.  · If you have a mild illness, such as a cold, you can probably get the vaccine today. If you are moderately or severely ill, you should probably wait until you recover. Your doctor can advise you.  4. Risks of a vaccine reaction  With any medicine, including vaccines, there is a chance of side effects. These are usually mild and go away on their own, but serious reactions are also possible.  Most people who get HPV vaccine do not have any serious problems with it.  Mild or moderate problems following HPV vaccine:  · Reactions in the arm where the shot was given:  ? Soreness (about 9 people in 10)  ? Redness or swelling (about 1 person   in 3)  · Fever:  ? Mild (100°F) (about 1 person in 10)  ? Moderate (102°F) (about 1 person in 65)  · Other problems:  ? Headache (about 1 person in 3)  Problems that could happen after any injected vaccine:  · People sometimes faint after a medical procedure, including vaccination. Sitting or lying down for about 15 minutes can help prevent fainting, and injuries caused by a fall. Tell your doctor if you feel dizzy, or have vision changes or ringing in the ears.  · Some people get severe pain in the shoulder and have difficulty moving  the arm where a shot was given. This happens very rarely.  · Any medication can cause a severe allergic reaction. Such reactions from a vaccine are very rare, estimated at about 1 in a million doses, and would happen within a few minutes to a few hours after the vaccination.  As with any medicine, there is a very remote chance of a vaccine causing a serious injury or death.  The safety of vaccines is always being monitored. For more information, visit: www.cdc.gov/vaccinesafety/.  5. What if there is a serious reaction?  What should I look for?  Look for anything that concerns you, such as signs of a severe allergic reaction, very high fever, or unusual behavior.  Signs of a severe allergic reaction can include hives, swelling of the face and throat, difficulty breathing, a fast heartbeat, dizziness, and weakness. These would usually start a few minutes to a few hours after the vaccination.  What should I do?  If you think it is a severe allergic reaction or other emergency that can't wait, call 9-1-1 or get to the nearest hospital. Otherwise, call your doctor.  Afterward, the reaction should be reported to the Vaccine Adverse Event Reporting System (VAERS). Your doctor should file this report, or you can do it yourself through the VAERS web site at www.vaers.hhs.gov, or by calling 1-800-822-7967.  VAERS does not give medical advice.  6. The National Vaccine Injury Compensation Program  The National Vaccine Injury Compensation Program (VICP) is a federal program that was created to compensate people who may have been injured by certain vaccines.  Persons who believe they may have been injured by a vaccine can learn about the program and about filing a claim by calling 1-800-338-2382 or visiting the VICP website at www.hrsa.gov/vaccinecompensation. There is a time limit to file a claim for compensation.  7. How can I learn more?  · Ask your health care provider. He or she can give you the vaccine  package insert or suggest other sources of information.  · Call your local or state health department.  · Contact the Centers for Disease Control and Prevention (CDC):  ? Call 1-800-232-4636 (1-800-CDC-INFO) or  ? Visit CDC’s website at www.cdc.gov/hpv  Vaccine Information Statement, HPV Vaccine (06/22/2015)  This information is not intended to replace advice given to you by your health care provider. Make sure you discuss any questions you have with your health care provider.  Document Released: 02/01/2014 Document Revised: 03/27/2016 Document Reviewed: 03/27/2016  Elsevier Interactive Patient Education © 2017 Elsevier Inc.

## 2018-06-10 NOTE — Progress Notes (Signed)
hpv

## 2018-06-11 ENCOUNTER — Telehealth: Payer: Self-pay | Admitting: Family Medicine

## 2018-06-11 DIAGNOSIS — D509 Iron deficiency anemia, unspecified: Secondary | ICD-10-CM

## 2018-06-11 LAB — CBC WITH DIFFERENTIAL/PLATELET
BASOS: 0 %
Basophils Absolute: 0 10*3/uL (ref 0.0–0.2)
EOS (ABSOLUTE): 0.1 10*3/uL (ref 0.0–0.4)
Eos: 1 %
Hematocrit: 24.8 % — ABNORMAL LOW (ref 34.0–46.6)
Hemoglobin: 6.9 g/dL — CL (ref 11.1–15.9)
IMMATURE GRANS (ABS): 0 10*3/uL (ref 0.0–0.1)
Immature Granulocytes: 0 %
LYMPHS ABS: 2.2 10*3/uL (ref 0.7–3.1)
LYMPHS: 34 %
MCH: 18.4 pg — AB (ref 26.6–33.0)
MCHC: 27.8 g/dL — ABNORMAL LOW (ref 31.5–35.7)
MCV: 66 fL — AB (ref 79–97)
MONOS ABS: 0.3 10*3/uL (ref 0.1–0.9)
Monocytes: 4 %
NEUTROS ABS: 3.9 10*3/uL (ref 1.4–7.0)
Neutrophils: 61 %
PLATELETS: 283 10*3/uL (ref 150–450)
RBC: 3.75 x10E6/uL — ABNORMAL LOW (ref 3.77–5.28)
RDW: 18.7 % — ABNORMAL HIGH (ref 12.3–15.4)
WBC: 6.5 10*3/uL (ref 3.4–10.8)

## 2018-06-11 LAB — FERRITIN: Ferritin: 5 ng/mL — ABNORMAL LOW (ref 15–150)

## 2018-06-11 LAB — IRON AND TIBC
IRON SATURATION: 6 % — AB (ref 15–55)
IRON: 20 ug/dL — AB (ref 27–159)
TIBC: 342 ug/dL (ref 250–450)
UIBC: 322 ug/dL (ref 131–425)

## 2018-06-11 NOTE — Telephone Encounter (Signed)
Please let her know that her iron is still low and her blood count have dropped a lot. She needs iron infusions as her iron is not working. I've referred her to hematology and they should be calling her shortly.

## 2018-06-11 NOTE — Telephone Encounter (Signed)
Called and left a detailed message for patient letting her know her lab results.  

## 2018-07-02 ENCOUNTER — Encounter: Payer: Self-pay | Admitting: Internal Medicine

## 2018-07-02 ENCOUNTER — Inpatient Hospital Stay: Payer: 59

## 2018-07-02 ENCOUNTER — Inpatient Hospital Stay: Payer: 59 | Attending: Internal Medicine | Admitting: Internal Medicine

## 2018-07-02 DIAGNOSIS — Z79899 Other long term (current) drug therapy: Secondary | ICD-10-CM | POA: Insufficient documentation

## 2018-07-02 DIAGNOSIS — D500 Iron deficiency anemia secondary to blood loss (chronic): Secondary | ICD-10-CM | POA: Insufficient documentation

## 2018-07-02 DIAGNOSIS — R5383 Other fatigue: Secondary | ICD-10-CM | POA: Diagnosis not present

## 2018-07-02 DIAGNOSIS — R5381 Other malaise: Secondary | ICD-10-CM | POA: Insufficient documentation

## 2018-07-02 LAB — CBC WITH DIFFERENTIAL/PLATELET
Abs Immature Granulocytes: 0.03 10*3/uL (ref 0.00–0.07)
Basophils Absolute: 0 10*3/uL (ref 0.0–0.1)
Basophils Relative: 0 %
EOS ABS: 0.1 10*3/uL (ref 0.0–0.5)
Eosinophils Relative: 1 %
HCT: 23.2 % — ABNORMAL LOW (ref 36.0–46.0)
Hemoglobin: 6.7 g/dL — ABNORMAL LOW (ref 12.0–15.0)
Immature Granulocytes: 0 %
Lymphocytes Relative: 26 %
Lymphs Abs: 1.9 10*3/uL (ref 0.7–4.0)
MCH: 18.6 pg — AB (ref 26.0–34.0)
MCHC: 28.9 g/dL — ABNORMAL LOW (ref 30.0–36.0)
MCV: 64.3 fL — AB (ref 80.0–100.0)
MONOS PCT: 5 %
Monocytes Absolute: 0.4 10*3/uL (ref 0.1–1.0)
Neutro Abs: 5.1 10*3/uL (ref 1.7–7.7)
Neutrophils Relative %: 68 %
Platelets: 297 10*3/uL (ref 150–400)
RBC: 3.61 MIL/uL — ABNORMAL LOW (ref 3.87–5.11)
RDW: 20.5 % — ABNORMAL HIGH (ref 11.5–15.5)
WBC: 7.5 10*3/uL (ref 4.0–10.5)
nRBC: 0 % (ref 0.0–0.2)

## 2018-07-02 LAB — COMPREHENSIVE METABOLIC PANEL
ALT: 7 U/L (ref 0–44)
AST: 11 U/L — ABNORMAL LOW (ref 15–41)
Albumin: 3.5 g/dL (ref 3.5–5.0)
Alkaline Phosphatase: 61 U/L (ref 38–126)
Anion gap: 5 (ref 5–15)
BUN: 20 mg/dL (ref 6–20)
CO2: 24 mmol/L (ref 22–32)
Calcium: 8.6 mg/dL — ABNORMAL LOW (ref 8.9–10.3)
Chloride: 109 mmol/L (ref 98–111)
Creatinine, Ser: 0.91 mg/dL (ref 0.44–1.00)
Glucose, Bld: 99 mg/dL (ref 70–99)
Potassium: 3.7 mmol/L (ref 3.5–5.1)
Sodium: 138 mmol/L (ref 135–145)
Total Bilirubin: 0.4 mg/dL (ref 0.3–1.2)
Total Protein: 7.5 g/dL (ref 6.5–8.1)

## 2018-07-02 LAB — TECHNOLOGIST SMEAR REVIEW

## 2018-07-02 LAB — LACTATE DEHYDROGENASE: LDH: 121 U/L (ref 98–192)

## 2018-07-02 NOTE — Progress Notes (Signed)
[facility] CONSULT NOTE  Patient Care Team: Dorcas Carrow, DO as PCP - General (Family Medicine)  CHIEF COMPLAINTS/PURPOSE OF CONSULTATION:    HEMATOLOGY HISTORY  # CHRONIC ANEMIA- iron deficiency likely from menorrhagia [ EGD/colonoscopy/capsule-none]   HISTORY OF PRESENTING ILLNESS:  Rachel Salas 42 y.o.  female has been referred to [place] for further evaluation/treatment option of longstanding anemia.  Patient states to have anemia "whole life"; she previously received IV iron in 2016.  She is not on oral iron because of intolerance/gastric discomfort.  Complains of pica.  Complains of fatigue.  Complains of generalized malaise.  Patient has a longstanding history of menorrhagia; previously had IUD placed.  Did not help.  Taken out.   Blood in stools: None; stool occult blood negative. Change in bowel habits- None Blood in urine: None Vaginal bleeding: heavy periods s/p IUD- didn't help; removed;  Difficulty swallowing: None Abnormal weight loss: None Iron supplementation: not on PO iron sec to discomfort.    Review of Systems  Constitutional: Positive for malaise/fatigue. Negative for diaphoresis, fever and weight loss.  HENT: Negative for nosebleeds and sore throat.   Eyes: Negative for double vision.  Respiratory: Negative for cough, hemoptysis, sputum production, shortness of breath and wheezing.   Cardiovascular: Negative for chest pain, palpitations, orthopnea and leg swelling.  Gastrointestinal: Negative for abdominal pain, blood in stool, constipation, diarrhea, heartburn, melena, nausea and vomiting.  Genitourinary: Negative for dysuria, frequency and urgency.  Musculoskeletal: Positive for myalgias. Negative for back pain and joint pain.  Skin: Negative.  Negative for itching and rash.  Neurological: Negative for dizziness, tingling, focal weakness, weakness and headaches.  Endo/Heme/Allergies: Does not bruise/bleed easily.   Psychiatric/Behavioral: Negative for depression. The patient is not nervous/anxious and does not have insomnia.     MEDICAL HISTORY:  Past Medical History:  Diagnosis Date  . Adenomyosis 11/15/2014   u/s suspicious for adenomyosis  . Essential hypertension, benign 10/14/2012  . GERD (gastroesophageal reflux disease)   . Hypertension   . Hypokalemia   . IDA (iron deficiency anemia)   . Menorrhagia with irregular cycle   . Pseudotumor cerebri dx 2007  . Severe obesity (HCC)     SURGICAL HISTORY: Past Surgical History:  Procedure Laterality Date  . TONSILLECTOMY  2007  . TONSILLECTOMY  2007  . TUBAL LIGATION  2002    SOCIAL HISTORY: Social History   Socioeconomic History  . Marital status: Married    Spouse name: Not on file  . Number of children: 3  . Years of education: BS  . Highest education level: Not on file  Occupational History    Employer: [employer]  Social Needs  . Financial resource strain: Not on file  . Food insecurity:    Worry: Not on file    Inability: Not on file  . Transportation needs:    Medical: Not on file    Non-medical: Not on file  Tobacco Use  . Smoking status: Never Smoker  . Smokeless tobacco: Never Used  Substance and Sexual Activity  . Alcohol use: Yes    Alcohol/week: 0.0 standard drinks    Comment: Rare/Occasional  . Drug use: No  . Sexual activity: Yes    Partners: Male    Birth control/protection: None, Surgical    Comment: BTL  Lifestyle  . Physical activity:    Days per week: Not on file    Minutes per session: Not on file  . Stress: Not on file  Relationships  .  Social connections:    Talks on phone: Not on file    Gets together: Not on file    Attends religious service: Not on file    Active member of club or organization: Not on file    Attends meetings of clubs or organizations: Not on file    Relationship status: Not on file  . Intimate partner violence:    Fear of current or ex partner: Not on file     Emotionally abused: Not on file    Physically abused: Not on file    Forced sexual activity: Not on file  Other Topics Concern  . 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 No smoking; occasional alcohol.     FAMILY HISTORY: Family History  Problem Relation Age of Onset  . Hypertension Mother   . Hypercholesterolemia Mother   . Thyroid disease Mother   . Stroke Maternal Grandmother   . Colon cancer Neg Hx   . Breast cancer Neg Hx   . Ovarian cancer Neg Hx   . Heart disease Neg Hx     ALLERGIES:  is allergic to tylenol [acetaminophen].  MEDICATIONS:  Current Outpatient Medications  Medication Sig Dispense Refill  . cyclobenzaprine (FLEXERIL) 10 MG tablet Take 1 tablet (10 mg total) by mouth at bedtime. 30 tablet 0  . Naltrexone-buPROPion HCl ER (CONTRAVE) 8-90 MG TB12 2 tabs BID 120 tablet 6  . ferrous sulfate 325 (65 FE) MG tablet Take 1 tablet (325 mg total) by mouth 3 (three) times daily with meals. (Patient not taking: Reported on 07/02/2018) 90 tablet 3  . ondansetron (ZOFRAN ODT) 8 MG disintegrating tablet Take 1 tablet (8 mg total) by mouth every 8 (eight) hours as needed for nausea or vomiting. (Patient not taking: Reported on 07/02/2018) 30 tablet 1  . valACYclovir (VALTREX) 1000 MG tablet Take 2 tablets (2,000 mg total) by mouth 2 (two) times daily. For 1 day. Start as soon as you feel the symptoms starting (Patient not taking: Reported on 07/02/2018) 20 tablet 12   No current facility-administered medications for this visit.       PHYSICAL EXAMINATION:   Vitals:   07/02/18 1110  BP: 126/86  Pulse: 76  Resp: 16  Temp: (!) 96.2 F (35.7 C)   Filed Weights   07/02/18 1110  Weight: 224 lb (101.6 kg)    Physical Exam  Constitutional: She is oriented to person, place, and time and well-developed, well-nourished, and in no distress.  HENT:  Head: Normocephalic and  atraumatic.  Mouth/Throat: Oropharynx is clear and moist. No oropharyngeal exudate.  Eyes: Pupils are equal, round, and reactive to light.  Neck: Normal range of motion. Neck supple.  Cardiovascular: Normal rate and regular rhythm.  Pulmonary/Chest: No respiratory distress. She has no wheezes.  Abdominal: Soft. Bowel sounds are normal. She exhibits no distension and no mass. There is no abdominal tenderness. There is no rebound and no guarding.  Musculoskeletal: Normal range of motion.        General: No tenderness or edema.  Neurological: She is alert and oriented to person, place, and time.  Skin: Skin is warm. There is pallor.  Psychiatric: Affect normal.     LABORATORY DATA:  I have reviewed the data as listed Lab Results  Component Value Date   WBC 7.5 07/02/2018   HGB 6.7 (L) 07/02/2018   HCT  23.2 (L) 07/02/2018   MCV 64.3 (L) 07/02/2018   PLT 297 07/02/2018   Recent Labs    11/20/17 1039 07/02/18 1212  NA 143 138  K 3.5 3.7  CL 108* 109  CO2 22 24  GLUCOSE 87 99  BUN 8 20  CREATININE 0.83 0.91  CALCIUM 8.8 8.6*  GFRNONAA 89 >60  GFRAA 103 >60  PROT 6.7 7.5  ALBUMIN 3.9 3.5  AST 10 11*  ALT 5 7  ALKPHOS 79 61  BILITOT 0.4 0.4     No results found.  Iron deficiency anemia due to chronic blood loss # IDA sec to menorrahgia.  Most recent hemoglobin approximately 3 weeks ago 6.9.  Iron studies suggestive of severe iron deficiency.  #Recommend Feraheme.  Discussed the potential infusion reactions.   #History of menorrhagia-defer management to PCP/gynecology.  Thank you Dr.Johnson for allowing me to participate in the care of your pleasant patient. Please do not hesitate to contact me with questions or concerns in the interim.  # DISPOSITION: # labs today # starting early next week- Ferrahem weekly x 3;   # follow up in 6 weeks; labs- possible ferrahem-Dr.B   All questions were answered. The patient knows to call the clinic with any problems, questions  or concerns.   Earna CoderGovinda R Blair Mesina, MD 07/02/2018 1:02 PM

## 2018-07-02 NOTE — Assessment & Plan Note (Addendum)
#   IDA sec to menorrahgia.  Most recent hemoglobin approximately 3 weeks ago 6.9.  Iron studies suggestive of severe iron deficiency.  #Recommend Feraheme.  Discussed the potential infusion reactions.   #History of menorrhagia-defer management to PCP/gynecology.  Thank you Dr.Johnson for allowing me to participate in the care of your pleasant patient. Please do not hesitate to contact me with questions or concerns in the interim.  # DISPOSITION: # labs today # starting early next week- Ferrahem weekly x 3;   # follow up in 6 weeks; labs- possible ferrahem-Dr.B

## 2018-07-05 ENCOUNTER — Telehealth: Payer: Self-pay | Admitting: Internal Medicine

## 2018-07-05 NOTE — Telephone Encounter (Signed)
Mychart message sent.

## 2018-07-08 ENCOUNTER — Inpatient Hospital Stay: Payer: 59

## 2018-07-08 VITALS — BP 120/82 | HR 86 | Temp 97.0°F | Resp 18

## 2018-07-08 DIAGNOSIS — R5383 Other fatigue: Secondary | ICD-10-CM | POA: Diagnosis not present

## 2018-07-08 DIAGNOSIS — D500 Iron deficiency anemia secondary to blood loss (chronic): Secondary | ICD-10-CM

## 2018-07-08 DIAGNOSIS — Z79899 Other long term (current) drug therapy: Secondary | ICD-10-CM | POA: Diagnosis not present

## 2018-07-08 DIAGNOSIS — R5381 Other malaise: Secondary | ICD-10-CM | POA: Diagnosis not present

## 2018-07-08 MED ORDER — SODIUM CHLORIDE 0.9 % IV SOLN
510.0000 mg | Freq: Once | INTRAVENOUS | Status: AC
  Administered 2018-07-08: 510 mg via INTRAVENOUS
  Filled 2018-07-08: qty 17

## 2018-07-08 MED ORDER — SODIUM CHLORIDE 0.9 % IV SOLN
Freq: Once | INTRAVENOUS | Status: AC
  Administered 2018-07-08: 14:00:00 via INTRAVENOUS
  Filled 2018-07-08: qty 250

## 2018-07-15 ENCOUNTER — Inpatient Hospital Stay: Payer: 59

## 2018-07-15 VITALS — BP 128/78 | HR 79 | Temp 96.5°F

## 2018-07-15 DIAGNOSIS — D500 Iron deficiency anemia secondary to blood loss (chronic): Secondary | ICD-10-CM | POA: Diagnosis not present

## 2018-07-15 DIAGNOSIS — R5383 Other fatigue: Secondary | ICD-10-CM | POA: Diagnosis not present

## 2018-07-15 DIAGNOSIS — Z79899 Other long term (current) drug therapy: Secondary | ICD-10-CM | POA: Diagnosis not present

## 2018-07-15 DIAGNOSIS — R5381 Other malaise: Secondary | ICD-10-CM | POA: Diagnosis not present

## 2018-07-15 MED ORDER — SODIUM CHLORIDE 0.9 % IV SOLN
510.0000 mg | Freq: Once | INTRAVENOUS | Status: AC
  Administered 2018-07-15: 510 mg via INTRAVENOUS
  Filled 2018-07-15: qty 17

## 2018-07-15 MED ORDER — SODIUM CHLORIDE 0.9 % IV SOLN
Freq: Once | INTRAVENOUS | Status: AC
  Administered 2018-07-15: 14:00:00 via INTRAVENOUS
  Filled 2018-07-15: qty 250

## 2018-07-22 ENCOUNTER — Inpatient Hospital Stay: Payer: 59 | Attending: Internal Medicine

## 2018-07-22 ENCOUNTER — Encounter: Payer: Self-pay | Admitting: Nurse Practitioner

## 2018-07-22 VITALS — BP 125/87 | HR 93 | Temp 97.0°F | Resp 18

## 2018-07-22 DIAGNOSIS — R5383 Other fatigue: Secondary | ICD-10-CM | POA: Diagnosis not present

## 2018-07-22 DIAGNOSIS — D509 Iron deficiency anemia, unspecified: Secondary | ICD-10-CM | POA: Diagnosis not present

## 2018-07-22 DIAGNOSIS — N920 Excessive and frequent menstruation with regular cycle: Secondary | ICD-10-CM | POA: Diagnosis not present

## 2018-07-22 DIAGNOSIS — Z79899 Other long term (current) drug therapy: Secondary | ICD-10-CM | POA: Diagnosis not present

## 2018-07-22 DIAGNOSIS — D500 Iron deficiency anemia secondary to blood loss (chronic): Secondary | ICD-10-CM

## 2018-07-22 MED ORDER — METHYLPREDNISOLONE SODIUM SUCC 125 MG IJ SOLR
125.0000 mg | Freq: Once | INTRAMUSCULAR | Status: AC
  Administered 2018-07-22: 125 mg via INTRAVENOUS

## 2018-07-22 MED ORDER — SODIUM CHLORIDE 0.9 % IV SOLN
510.0000 mg | Freq: Once | INTRAVENOUS | Status: AC
  Administered 2018-07-22: 510 mg via INTRAVENOUS
  Filled 2018-07-22: qty 17

## 2018-07-22 MED ORDER — DIPHENHYDRAMINE HCL 50 MG/ML IJ SOLN
25.0000 mg | Freq: Once | INTRAMUSCULAR | Status: AC
  Administered 2018-07-22: 25 mg via INTRAVENOUS

## 2018-07-22 MED ORDER — SODIUM CHLORIDE 0.9 % IV SOLN
Freq: Once | INTRAVENOUS | Status: AC
  Administered 2018-07-22: 14:00:00 via INTRAVENOUS
  Filled 2018-07-22: qty 250

## 2018-07-22 NOTE — Progress Notes (Signed)
Mrs. Rachel Salas reports feeling better. No complaints noted. Vital signs are still stable. IV removed and patient was sent home at 1530.

## 2018-07-22 NOTE — Progress Notes (Signed)
At 1403 Mrs. Rachel Salas started to complain of chest heaviness during her Feraheme infusion. Medication infusion was stopped and a 500 ml bolus was started. Patient received 25 mg of Benadryl and 125 mg of solumedrol. Vital signs was stable. Lauren NP came to assess patient. Informed to keep patient here for observation for one hour. Will continue to monitor patient closely.

## 2018-07-22 NOTE — Progress Notes (Signed)
Infusion Reaction Progress Note  Date: 07/22/18  Time: 2:04 pm  Medication given: Feraheme.   Reaction Type: chest heaviness/pressure. Arm pain  Hx of reaction: denies  Called to infusion for possible reaction to Feraheme. This is patient's second dose.  She reported chest heaviness and pressure with less than 100 cc of Feraheme infused.   Reaction medication: per protocol, Feraheme stopped. Fluid bolus started. Benadryl 25 mg IV and solumedrol 125 mg IV given by nursing prior to my arrival. Vitals wnl. Patient sitting tensely upright. Complains of chest heaviness which has improved slightly. Vitals stable. Patient speaking in full sentences. Breath sounds and heart sounds normal. Stable appearing.  Discussed with Dr. Donneta RombergBrahmanday who advises against re-challenge given her symptoms. Discussed stopping feraheme and starting venofer. Dr. Donneta RombergBrahmanday to coordinate.   Discussed this with patient who verbalizes understanding.  Patient does not have a driver today.  We will keep her in clinic and monitor her over next hour. Advised nursing that if vital signs stable and patient is asymptomatic over next hour then she can be discharged home.   Physical Exam  Constitutional: She is oriented to person, place, and time.  Cardiovascular: Normal rate, regular rhythm and normal heart sounds.  Pulmonary/Chest: Breath sounds normal. No stridor. She has no wheezes. She has no rales.  Speaking in full sentences.   Neurological: She is alert and oriented to person, place, and time.  Psychiatric: Her mood appears anxious (anxious appearing).    Consuello MasseLauren Rena Hunke, DNP, AGNP-C [facility] [phone] (work cell) [phone] (office)

## 2018-08-09 ENCOUNTER — Other Ambulatory Visit: Payer: Self-pay

## 2018-08-09 DIAGNOSIS — D500 Iron deficiency anemia secondary to blood loss (chronic): Secondary | ICD-10-CM

## 2018-08-13 ENCOUNTER — Encounter: Payer: Self-pay | Admitting: Internal Medicine

## 2018-08-13 ENCOUNTER — Inpatient Hospital Stay: Payer: 59

## 2018-08-13 ENCOUNTER — Encounter: Payer: Self-pay | Admitting: Nurse Practitioner

## 2018-08-13 ENCOUNTER — Other Ambulatory Visit: Payer: Self-pay | Admitting: Internal Medicine

## 2018-08-13 ENCOUNTER — Inpatient Hospital Stay (HOSPITAL_BASED_OUTPATIENT_CLINIC_OR_DEPARTMENT_OTHER): Payer: 59 | Admitting: Nurse Practitioner

## 2018-08-13 VITALS — BP 119/84 | HR 82 | Temp 97.4°F | Resp 16 | Wt 226.0 lb

## 2018-08-13 VITALS — BP 125/83 | HR 73 | Resp 18

## 2018-08-13 DIAGNOSIS — N920 Excessive and frequent menstruation with regular cycle: Secondary | ICD-10-CM

## 2018-08-13 DIAGNOSIS — D500 Iron deficiency anemia secondary to blood loss (chronic): Secondary | ICD-10-CM

## 2018-08-13 DIAGNOSIS — D509 Iron deficiency anemia, unspecified: Secondary | ICD-10-CM | POA: Diagnosis not present

## 2018-08-13 DIAGNOSIS — R5383 Other fatigue: Secondary | ICD-10-CM

## 2018-08-13 DIAGNOSIS — Z79899 Other long term (current) drug therapy: Secondary | ICD-10-CM | POA: Diagnosis not present

## 2018-08-13 LAB — CBC WITH DIFFERENTIAL/PLATELET
Abs Immature Granulocytes: 0.01 10*3/uL (ref 0.00–0.07)
Basophils Absolute: 0 10*3/uL (ref 0.0–0.1)
Basophils Relative: 0 %
Eosinophils Absolute: 0.1 10*3/uL (ref 0.0–0.5)
Eosinophils Relative: 1 %
HCT: 33 % — ABNORMAL LOW (ref 36.0–46.0)
Hemoglobin: 10.1 g/dL — ABNORMAL LOW (ref 12.0–15.0)
Immature Granulocytes: 0 %
Lymphocytes Relative: 22 %
Lymphs Abs: 1.4 10*3/uL (ref 0.7–4.0)
MCH: 24.5 pg — ABNORMAL LOW (ref 26.0–34.0)
MCHC: 30.6 g/dL (ref 30.0–36.0)
MCV: 79.9 fL — ABNORMAL LOW (ref 80.0–100.0)
MONO ABS: 0.3 10*3/uL (ref 0.1–1.0)
Monocytes Relative: 5 %
Neutro Abs: 4.5 10*3/uL (ref 1.7–7.7)
Neutrophils Relative %: 72 %
Platelets: 217 10*3/uL (ref 150–400)
RBC: 4.13 MIL/uL (ref 3.87–5.11)
Smear Review: ADEQUATE
WBC: 6.2 10*3/uL (ref 4.0–10.5)
nRBC: 0 % (ref 0.0–0.2)

## 2018-08-13 LAB — COMPREHENSIVE METABOLIC PANEL
ALT: 10 U/L (ref 0–44)
AST: 13 U/L — ABNORMAL LOW (ref 15–41)
Albumin: 3.5 g/dL (ref 3.5–5.0)
Alkaline Phosphatase: 61 U/L (ref 38–126)
Anion gap: 6 (ref 5–15)
BILIRUBIN TOTAL: 0.4 mg/dL (ref 0.3–1.2)
BUN: 14 mg/dL (ref 6–20)
CO2: 24 mmol/L (ref 22–32)
Calcium: 8.4 mg/dL — ABNORMAL LOW (ref 8.9–10.3)
Chloride: 109 mmol/L (ref 98–111)
Creatinine, Ser: 0.98 mg/dL (ref 0.44–1.00)
GFR calc Af Amer: >60 mL/min (ref >60)
GFR calc non Af Amer: >60 mL/min (ref >60)
Glucose, Bld: 108 mg/dL — ABNORMAL HIGH (ref 70–99)
Potassium: 3.3 mmol/L — ABNORMAL LOW (ref 3.5–5.1)
Sodium: 139 mmol/L (ref 135–145)
Total Protein: 7.3 g/dL (ref 6.5–8.1)

## 2018-08-13 LAB — IRON AND TIBC
Iron: 31 ug/dL (ref 28–170)
Saturation Ratios: 12 % (ref 10.4–31.8)
TIBC: 261 ug/dL (ref 250–450)
UIBC: 230 ug/dL

## 2018-08-13 LAB — FERRITIN: FERRITIN: 88 ng/mL (ref 11–307)

## 2018-08-13 LAB — LACTATE DEHYDROGENASE: LDH: 109 U/L (ref 98–192)

## 2018-08-13 MED ORDER — IRON SUCROSE 20 MG/ML IV SOLN
200.0000 mg | Freq: Once | INTRAVENOUS | Status: AC
  Administered 2018-08-13: 200 mg via INTRAVENOUS
  Filled 2018-08-13: qty 10

## 2018-08-13 MED ORDER — SODIUM CHLORIDE 0.9 % IV SOLN
Freq: Once | INTRAVENOUS | Status: AC
  Administered 2018-08-13: 14:00:00 via INTRAVENOUS
  Filled 2018-08-13: qty 250

## 2018-08-13 MED ORDER — SODIUM CHLORIDE 0.9 % IV SOLN: 200.0000 mg | Freq: Once | INTRAVENOUS | Status: DC

## 2018-08-13 NOTE — Progress Notes (Signed)
Orders changed to venofer.

## 2018-08-13 NOTE — Progress Notes (Signed)
[facility] INTERVAL NOTE  Patient Care Team: Dorcas Carrow, DO as PCP - General (Family Medicine)  CHIEF COMPLAINTS/PURPOSE OF CONSULTATION:  Follow up & consideration of Venofer  HEMATOLOGY HISTORY:  # CHRONIC ANEMIA- iron deficiency likely from menorrhagia [ EGD/colonoscopy/capsule-none]   HISTORY OF PRESENTING ILLNESS:  Rachel Salas 43 y.o.  female, initially referred for evaluation for longstanding history of anemia. Per patient, anemia her 'whole life'. She has longstanding history of menorrhagia and previously had IUD placed which unfortunately did not help with her heavy bleeding and was ultimately removed. She previously received IV iron in 2016. She is unable to tolerate oral iron because of intolerance/gastric discomfort.   Blood in stools: None; stool occult blood negative. Change in bowel habits- None Blood in urine: None Vaginal bleeding: heavy periods s/p IUD w/o improvement in symptoms; removed Difficulty swallowing: None Abnormal weight loss: None Iron supplementation: not on PO iron sec to discomfort.   Interval History: Suffered a possible infusion reaction to Feraheme at her last infusion. Complained of chest heaviness/pressure and arm pain. Feraheme was stopped and she received iv fluids, benadryl and solumedrol with resolution of symptoms.  She has heavy menstrual periods. None today but anticipates period in next 1-2 weeks. Continues to feel tired. No pica.    Review of Systems  Constitutional: Positive for malaise/fatigue. Negative for diaphoresis, fever and weight loss.  HENT: Negative for nosebleeds and sore throat.   Eyes: Negative for double vision.  Respiratory: Negative for cough, hemoptysis, sputum production, shortness of breath and wheezing.   Cardiovascular: Negative for chest pain, palpitations, orthopnea and leg swelling.  Gastrointestinal: Negative for abdominal pain, blood in stool, constipation, diarrhea, heartburn, melena,  nausea and vomiting.  Genitourinary: Negative for dysuria, frequency and urgency.  Musculoskeletal: Positive for myalgias. Negative for back pain and joint pain.  Skin: Negative.  Negative for itching and rash.  Neurological: Negative for dizziness, tingling, focal weakness, weakness and headaches.  Endo/Heme/Allergies: Does not bruise/bleed easily.  Psychiatric/Behavioral: Negative for depression. The patient is not nervous/anxious and does not have insomnia.     MEDICAL HISTORY:  Past Medical History:  Diagnosis Date  . Adenomyosis 11/15/2014   u/s suspicious for adenomyosis  . Essential hypertension, benign 10/14/2012  . GERD (gastroesophageal reflux disease)   . Hypertension   . Hypokalemia   . IDA (iron deficiency anemia)   . Menorrhagia with irregular cycle   . Pseudotumor cerebri dx 2007  . Severe obesity (HCC)     SURGICAL HISTORY: Past Surgical History:  Procedure Laterality Date  . TONSILLECTOMY  2007  . TONSILLECTOMY  2007  . TUBAL LIGATION  2002    SOCIAL HISTORY: Social History   Socioeconomic History  . Marital status: Married    Spouse name: Not on file  . Number of children: 3  . Years of education: BS  . Highest education level: Not on file  Occupational History    Employer: [employer]  Social Needs  . Financial resource strain: Not on file  . Food insecurity:    Worry: Not on file    Inability: Not on file  . Transportation needs:    Medical: Not on file    Non-medical: Not on file  Tobacco Use  . Smoking status: Never Smoker  . Smokeless tobacco: Never Used  Substance and Sexual Activity  . Alcohol use: Yes    Alcohol/week: 0.0 standard drinks    Comment: Rare/Occasional  . Drug use: No  . Sexual  activity: Yes    Partners: Male    Birth control/protection: None, Surgical    Comment: BTL  Lifestyle  . Physical activity:    Days per week: Not on file    Minutes per session: Not on file  . Stress: Not on file  Relationships  . Social  connections:    Talks on phone: Not on file    Gets together: Not on file    Attends religious service: Not on file    Active member of club or organization: Not on file    Attends meetings of clubs or organizations: Not on file    Relationship status: Not on file  . Intimate partner violence:    Fear of current or ex partner: Not on file    Emotionally abused: Not on file    Physically abused: Not on file    Forced sexual activity: Not on file  Other Topics Concern  . 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 No smoking; occasional alcohol.     FAMILY HISTORY: Family History  Problem Relation Age of Onset  . Hypertension Mother   . Hypercholesterolemia Mother   . Thyroid disease Mother   . Stroke Maternal Grandmother   . Colon cancer Neg Hx   . Breast cancer Neg Hx   . Ovarian cancer Neg Hx   . Heart disease Neg Hx     ALLERGIES:  is allergic to tylenol [acetaminophen].  MEDICATIONS:  Current Outpatient Medications  Medication Sig Dispense Refill  . cyclobenzaprine (FLEXERIL) 10 MG tablet Take 1 tablet (10 mg total) by mouth at bedtime. 30 tablet 0  . ferrous sulfate 325 (65 FE) MG tablet Take 1 tablet (325 mg total) by mouth 3 (three) times daily with meals. 90 tablet 3  . Naltrexone-buPROPion HCl ER (CONTRAVE) 8-90 MG TB12 2 tabs BID 120 tablet 6  . ondansetron (ZOFRAN ODT) 8 MG disintegrating tablet Take 1 tablet (8 mg total) by mouth every 8 (eight) hours as needed for nausea or vomiting. 30 tablet 1  . valACYclovir (VALTREX) 1000 MG tablet Take 2 tablets (2,000 mg total) by mouth 2 (two) times daily. For 1 day. Start as soon as you feel the symptoms starting 20 tablet 12   No current facility-administered medications for this visit.     PHYSICAL EXAMINATION: Vitals:   08/13/18 1323  BP: 119/84  Pulse: 82  Resp: 16  Temp: (!) 97.4 F (36.3 C)   Filed Weights   08/13/18  1323  Weight: 226 lb (102.5 kg)    Physical Exam  Constitutional: She is oriented to person, place, and time and well-developed, well-nourished, and in no distress.  HENT:  Head: Normocephalic and atraumatic.  Mouth/Throat: Oropharynx is clear and moist. No oropharyngeal exudate.  Eyes: Pupils are equal, round, and reactive to light.  Neck: Normal range of motion. Neck supple.  Cardiovascular: Normal rate and regular rhythm.  Pulmonary/Chest: No respiratory distress. She has no wheezes.  Abdominal: Soft. Bowel sounds are normal. She exhibits no distension and no mass. There is no abdominal tenderness. There is no rebound and no guarding.  Musculoskeletal: Normal range of motion.        General: No tenderness or edema.  Neurological: She is alert and oriented to person, place, and time.  Skin: Skin is warm. There is pallor.  Psychiatric: Affect normal.  LABORATORY DATA:  I have reviewed the data as listed Lab Results  Component Value Date   WBC 7.5 07/02/2018   HGB 6.7 (L) 07/02/2018   HCT 23.2 (L) 07/02/2018   MCV 64.3 (L) 07/02/2018   PLT 297 07/02/2018   Recent Labs    11/20/17 1039 07/02/18 1212 08/13/18 1300  NA 143 138 139  K 3.5 3.7 3.3*  CL 108* 109 109  CO2 22 24 24   GLUCOSE 87 99 108*  BUN 8 20 14   CREATININE 0.83 0.91 0.98  CALCIUM 8.8 8.6* 8.4*  GFRNONAA 89 >60 >60  GFRAA 103 >60 >60  PROT 6.7 7.5 7.3  ALBUMIN 3.9 3.5 3.5  AST 10 11* 13*  ALT 5 7 10   ALKPHOS 79 61 61  BILITOT 0.4 0.4 0.4   No results found.  Assessment & Plan No problem-specific Assessment & Plan notes found for this encounter.  - Iron Deficiency anemia- secondary to menorrhagia. Suffered infusion reaction to feraheme on dose 3. Received 2 doses. Hmg improved to 10.1 today. Iron studies pending. Persistent microcytosis & Symptomatic. Proceed with Venofer x 4 then rtc in 6 weeks for cbc and follow up with Dr. Donneta RombergBrahmanday.   - Menorrhagia- s/p IUD without improvement, now removed.  Follow up with gynecology for continued management.   All patient questions were answered. The patient knows to call the clinic with any problems, questions or concerns.  Consuello MasseLauren Wyn Nettle, DNP, AGNP-C [facility] [phone] (work cell) [phone] (office)  CC: Dr. Donneta RombergBrahmanday

## 2018-08-20 ENCOUNTER — Inpatient Hospital Stay: Payer: 59

## 2018-08-20 VITALS — BP 131/86 | HR 71 | Temp 97.1°F | Resp 18

## 2018-08-20 DIAGNOSIS — D500 Iron deficiency anemia secondary to blood loss (chronic): Secondary | ICD-10-CM

## 2018-08-20 DIAGNOSIS — N920 Excessive and frequent menstruation with regular cycle: Secondary | ICD-10-CM | POA: Diagnosis not present

## 2018-08-20 DIAGNOSIS — R5383 Other fatigue: Secondary | ICD-10-CM | POA: Diagnosis not present

## 2018-08-20 DIAGNOSIS — D509 Iron deficiency anemia, unspecified: Secondary | ICD-10-CM | POA: Diagnosis not present

## 2018-08-20 DIAGNOSIS — Z79899 Other long term (current) drug therapy: Secondary | ICD-10-CM | POA: Diagnosis not present

## 2018-08-20 MED ORDER — SODIUM CHLORIDE 0.9 % IV SOLN
Freq: Once | INTRAVENOUS | Status: AC
  Administered 2018-08-20: 14:00:00 via INTRAVENOUS
  Filled 2018-08-20: qty 250

## 2018-08-20 MED ORDER — IRON SUCROSE 20 MG/ML IV SOLN
200.0000 mg | Freq: Once | INTRAVENOUS | Status: AC
  Administered 2018-08-20: 200 mg via INTRAVENOUS
  Filled 2018-08-20: qty 10

## 2018-08-21 DIAGNOSIS — Z111 Encounter for screening for respiratory tuberculosis: Secondary | ICD-10-CM | POA: Diagnosis not present

## 2018-08-23 ENCOUNTER — Encounter: Payer: Self-pay | Admitting: Family Medicine

## 2018-08-23 DIAGNOSIS — Z111 Encounter for screening for respiratory tuberculosis: Secondary | ICD-10-CM | POA: Diagnosis not present

## 2018-08-24 ENCOUNTER — Ambulatory Visit: Payer: Self-pay | Admitting: *Deleted

## 2018-08-24 ENCOUNTER — Encounter: Payer: Self-pay | Admitting: Family Medicine

## 2018-08-24 MED ORDER — FLUCONAZOLE 150 MG PO TABS: 150.0000 mg | ORAL_TABLET | Freq: Every day | ORAL | 0 refills | Status: DC

## 2018-08-24 NOTE — Telephone Encounter (Signed)
Patient states she has yeast infection and she can not come for appointment- she is doing orientation for new job. Patient is requesting Diflucan called to pharmacy- patient states OTC treatments do not work well for her. Reason for Disposition . [1] Request for URGENT new prescription or refill of "essential" medication (i.e., likelihood of harm to patient if not taken) AND [2] triager unable to fill per unit policy  Answer Assessment - Initial Assessment Questions 1. DISCHARGE: "Describe the discharge." (e.g., white, yellow, green, gray, foamy, cottage cheese-like)     White- cottage cheese like-thick 2. ODOR: "Is there a bad odor?"     no 3. ONSET: "When did the discharge begin?"     2 days ago 4. RASH: "Is there a rash in that area?" If so, ask: "Describe it." (e.g., redness, blisters, sores, bumps)     no 5. ABDOMINAL PAIN: "Are you having any abdominal pain?" If yes: "What does it feel like? " (e.g., crampy, dull, intermittent, constant)      irritation and swelling from sitting in class 6. ABDOMINAL PAIN SEVERITY: If present, ask: "How bad is it?"  (e.g., mild, moderate, severe)  - MILD - doesn't interfere with normal activities   - MODERATE - interferes with normal activities or awakens from sleep   - SEVERE - patient doesn't want to move (R/O peritonitis)      No pain 7. CAUSE: "What do you think is causing the discharge?" "Have you had the same problem before? What happened then?"     Yeast infection- OTC usually does not clear yeast up for patient 8. OTHER SYMPTOMS: "Do you have any other symptoms?" (e.g., fever, itching, vaginal bleeding, pain with urination, injury to genital area, vaginal foreign body)     Itching, irritation 9. PREGNANCY: "Is there any chance you are pregnant?" "When was your last menstrual period?"     No- LMP- 08/16/18  Answer Assessment - Initial Assessment Questions 1. SYMPTOMS: "Do you have any symptoms?"     Itching,discharge, irritation  2.  SEVERITY: If symptoms are present, ask "Are they mild, moderate or severe?"     Moderate/severe  Protocols used: MEDICATION QUESTION CALL-A-AH, VAGINAL DISCHARGE-A-AH

## 2018-08-24 NOTE — Addendum Note (Signed)
Addended by: Dorcas Carrow on: 08/24/2018 04:17 PM   Modules accepted: Orders

## 2018-08-24 NOTE — Telephone Encounter (Signed)
  Message from Gerrianne Scale sent at 08/24/2018 12:31 PM EST   Pt calling stating that she had sent a message yesterday about getting Diflucan she had discharge itching and swollen with no smell she would like to use the   [facility], [place] - [address] [place] [phone] (Phone) [phone] (Fax)     Attempted to all pt to discuss symptoms but no answer at this time. Unable to leave message due to mailbox being full and unable to accept messages at this time.

## 2018-08-27 ENCOUNTER — Inpatient Hospital Stay: Payer: Commercial Managed Care - PPO

## 2018-09-03 ENCOUNTER — Inpatient Hospital Stay: Payer: Commercial Managed Care - PPO

## 2018-09-06 ENCOUNTER — Inpatient Hospital Stay: Payer: 59 | Attending: Internal Medicine

## 2018-09-06 VITALS — BP 123/84 | HR 78 | Temp 97.1°F | Resp 18

## 2018-09-06 DIAGNOSIS — D509 Iron deficiency anemia, unspecified: Secondary | ICD-10-CM | POA: Insufficient documentation

## 2018-09-06 DIAGNOSIS — D500 Iron deficiency anemia secondary to blood loss (chronic): Secondary | ICD-10-CM

## 2018-09-06 MED ORDER — SODIUM CHLORIDE 0.9 % IV SOLN
Freq: Once | INTRAVENOUS | Status: AC
  Administered 2018-09-06: 14:00:00 via INTRAVENOUS
  Filled 2018-09-06: qty 250

## 2018-09-06 MED ORDER — IRON SUCROSE 20 MG/ML IV SOLN
200.0000 mg | Freq: Once | INTRAVENOUS | Status: AC
  Administered 2018-09-06: 200 mg via INTRAVENOUS
  Filled 2018-09-06: qty 10

## 2018-09-13 ENCOUNTER — Inpatient Hospital Stay: Payer: 59

## 2018-09-27 ENCOUNTER — Ambulatory Visit: Payer: 59

## 2018-09-27 ENCOUNTER — Inpatient Hospital Stay: Payer: Self-pay

## 2018-09-27 ENCOUNTER — Inpatient Hospital Stay: Payer: Self-pay | Admitting: Internal Medicine

## 2018-10-03 ENCOUNTER — Telehealth: Payer: Self-pay | Admitting: Physician Assistant

## 2018-10-03 DIAGNOSIS — B001 Herpesviral vesicular dermatitis: Secondary | ICD-10-CM

## 2018-10-03 MED ORDER — VALACYCLOVIR HCL 1 G PO TABS: 2000.0000 mg | ORAL_TABLET | Freq: Two times a day (BID) | ORAL | 0 refills | Status: DC

## 2018-10-03 NOTE — Progress Notes (Signed)
We are sorry that you are not feeling well.  Here is how we plan to help!  Based on what you have shared with me it does look like you have a viral infection.    Most cold sores or fever blisters are small fluid filled blisters around the mouth caused by herpes simplex virus.  The most common strain of the virus causing cold sores is herpes simplex virus 1.  It can be spread by skin contact, sharing eating utensils, or even sharing towels.  Cold sores are contagious to other people until dry. (Approximately 5-7 days).  Wash your [place]. You can spread the virus to your eyes through handling your contact lenses after touching the lesions.  Most people experience pain at the sight or tingling sensations in their lips that may begin before the ulcers erupt.  Herpes simplex is treatable but not curable.  It may lie dormant for a long time and then reappear due to stress or prolonged sun exposure.  Many patients have success in treating their cold sores with an over the counter topical called Abreva.  You may apply the cream up to 5 times daily (maximum 10 days) until healing occurs.  If you would like to use an oral antiviral medication to speed the healing of your cold sore, I have sent a prescription to your local pharmacy Valacyclovir 2 gm twice daily for 1 day    HOME CARE:   Wash your [place] frequently.  Do not pick at or rub the sore.  Don't open the blisters.  Avoid kissing other people during this time.  Avoid sharing drinking glasses, eating utensils, or razors.  Do not handle contact lenses unless you have thoroughly washed your [place] with soap and warm water!  Avoid oral sex during this time.  Herpes from sores on your mouth can spread to your partner's genital area.  Avoid contact with anyone who has eczema or a weakened immune system.  Cold sores are often triggered by exposure to intense sunlight, use a lip balm containing a sunscreen (SPF 30 or higher).  GET HELP RIGHT AWAY  IF:   Blisters look infected.  Blisters occur near or in the eye.  Symptoms last longer than 10 days.  Your symptoms become worse.  MAKE SURE YOU:   Understand these instructions.  Will watch your condition.  Will get help right away if you are not doing well or get worse.    Your e-visit answers were reviewed by a board certified advanced clinical practitioner to complete your personal care plan.  Depending upon the condition, your plan could have  Included both over the counter or prescription medications.    Please review your pharmacy choice.  Be sure that the pharmacy you have chosen is open so that you can pick up your prescription now.  If there is a problem you can message your provider in MyChart to have the prescription routed to another pharmacy.    Your safety is important to us.  If you have drug allergies check our prescription carefully.  For the next 24 hours you can use MyChart to ask questions about today's visit, request a non-urgent call back, or ask for a work or school excuse from your e-visit provider.  You will get an email in the next two days asking about your experience.  I hope that your e-visit has been valuable and will speed your recovery.  

## 2018-10-03 NOTE — Progress Notes (Signed)
I have spent 5 minutes in review of e-visit questionnaire, review and updating patient chart, medical decision making and response to patient.   Ayriel Texidor Cody Shellie Goettl, PA-C    

## 2018-10-05 ENCOUNTER — Inpatient Hospital Stay: Payer: Self-pay

## 2018-10-05 ENCOUNTER — Inpatient Hospital Stay: Payer: Self-pay | Admitting: Internal Medicine

## 2018-11-02 ENCOUNTER — Other Ambulatory Visit: Payer: Self-pay

## 2018-11-02 ENCOUNTER — Other Ambulatory Visit: Payer: Self-pay | Admitting: *Deleted

## 2018-11-02 ENCOUNTER — Inpatient Hospital Stay: Payer: Self-pay | Attending: Internal Medicine | Admitting: *Deleted

## 2018-11-02 ENCOUNTER — Encounter: Payer: Self-pay | Admitting: Internal Medicine

## 2018-11-02 ENCOUNTER — Telehealth: Payer: Self-pay | Admitting: Internal Medicine

## 2018-11-02 ENCOUNTER — Inpatient Hospital Stay (HOSPITAL_BASED_OUTPATIENT_CLINIC_OR_DEPARTMENT_OTHER): Payer: Self-pay | Admitting: Internal Medicine

## 2018-11-02 DIAGNOSIS — D509 Iron deficiency anemia, unspecified: Secondary | ICD-10-CM

## 2018-11-02 DIAGNOSIS — D500 Iron deficiency anemia secondary to blood loss (chronic): Secondary | ICD-10-CM | POA: Insufficient documentation

## 2018-11-02 DIAGNOSIS — N920 Excessive and frequent menstruation with regular cycle: Secondary | ICD-10-CM | POA: Insufficient documentation

## 2018-11-02 LAB — CBC WITH DIFFERENTIAL/PLATELET
Abs Immature Granulocytes: 0.02 10*3/uL (ref 0.00–0.07)
Basophils Absolute: 0 10*3/uL (ref 0.0–0.1)
Basophils Relative: 0 %
Eosinophils Absolute: 0.1 10*3/uL (ref 0.0–0.5)
Eosinophils Relative: 2 %
HCT: 34.9 % — ABNORMAL LOW (ref 36.0–46.0)
Hemoglobin: 11.3 g/dL — ABNORMAL LOW (ref 12.0–15.0)
Immature Granulocytes: 0 %
Lymphocytes Relative: 29 %
Lymphs Abs: 2.1 10*3/uL (ref 0.7–4.0)
MCH: 27.6 pg (ref 26.0–34.0)
MCHC: 32.4 g/dL (ref 30.0–36.0)
MCV: 85.3 fL (ref 80.0–100.0)
Monocytes Absolute: 0.4 10*3/uL (ref 0.1–1.0)
Monocytes Relative: 6 %
Neutro Abs: 4.4 10*3/uL (ref 1.7–7.7)
Neutrophils Relative %: 63 %
Platelets: 187 10*3/uL (ref 150–400)
RBC: 4.09 MIL/uL (ref 3.87–5.11)
RDW: 13.6 % (ref 11.5–15.5)
WBC: 7 10*3/uL (ref 4.0–10.5)
nRBC: 0 % (ref 0.0–0.2)

## 2018-11-02 NOTE — Progress Notes (Signed)
I connected with Rachel Salas on 11/02/18 at  1:30 PM EDT by telephone visit and verified that I am speaking with the correct person using two identifiers.  I discussed the limitations, risks, security and privacy concerns of performing an evaluation and management service by telemedicine and the availability of in-person appointments. I also discussed with the patient that there may be a patient responsible charge related to this service. The patient expressed understanding and agreed to proceed.    Other persons participating in the visit and their role in the encounter: none  Patient's location: home Provider's location: office    No history exists.   HEMATOLOGY HISTORY  # CHRONIC ANEMIA- iron deficiency likely from menorrhagia [ EGD/colonoscopy/capsule-none]   Chief Complaint:Anemia  History of present illness:Rachel Salas 43 y.o.  female with history of iron deficiency anemia secondary to menorrhagia.  Patient continues to feel tired.  She is not on p.o. iron because of intolerance/stomach upset.  She continues to have heavy menstrual periods.  Not on birth control pills.  Observation/objective: hb- 11.3.   Assessment and plan: Iron deficiency anemia due to chronic blood loss # IDA sec to menorrahgia-status post IV Feraheme-hemoglobin today is 11.3.  Significantly improved from 6.7 approximately 5 months ago.  #Recommend p.o. iron 1 pill every other day/gastric upset.  Hold off IV iron infusion given pandemic.  #History of menorrhagia-defer management to PCP/gynecology-regarding birth control pills.  # DISPOSITION: #Follow-up in 3 months-MD/CBC/BMP-possible Feraheme-Dr.B    Follow-up instructions:  I discussed the assessment and treatment plan with the patient.  The patient was provided an opportunity to ask questions and all were answered.  The patient agreed with the plan and demonstrated understanding of instructions.  The patient was advised to call back or seek an  in person evaluation if the symptoms worsen or if the condition fails to improve as anticipated.  I provided 12 minutes of non face-to-face telephone visit time during this encounter, and > 50% was spent counseling as documented under my assessment & plan.   Dr. Louretta Shorten [facility] 11/02/2018 2:22 PM

## 2018-11-02 NOTE — Assessment & Plan Note (Addendum)
#   IDA sec to menorrahgia-status post IV Feraheme-hemoglobin today is 11.3.  Significantly improved from 6.7 approximately 5 months ago.  #Recommend p.o. iron 1 pill every other day/gastric upset.  Hold off IV iron infusion given pandemic.  #History of menorrhagia-defer management to PCP/gynecology-regarding birth control pills.  # DISPOSITION: #Follow-up in 3 months-MD/CBC/BMP-possible Feraheme-Dr.B 

## 2018-11-02 NOTE — Telephone Encounter (Signed)
Spoke to pt- GB

## 2018-11-22 ENCOUNTER — Encounter: Payer: Self-pay | Admitting: Family Medicine

## 2018-11-22 ENCOUNTER — Other Ambulatory Visit: Payer: Self-pay

## 2018-11-22 ENCOUNTER — Ambulatory Visit (INDEPENDENT_AMBULATORY_CARE_PROVIDER_SITE_OTHER): Payer: PRIVATE HEALTH INSURANCE | Admitting: Family Medicine

## 2018-11-22 VITALS — Ht 68.0 in | Wt 220.0 lb

## 2018-11-22 DIAGNOSIS — B001 Herpesviral vesicular dermatitis: Secondary | ICD-10-CM

## 2018-11-22 DIAGNOSIS — E6609 Other obesity due to excess calories: Secondary | ICD-10-CM | POA: Diagnosis not present

## 2018-11-22 DIAGNOSIS — D500 Iron deficiency anemia secondary to blood loss (chronic): Secondary | ICD-10-CM | POA: Diagnosis not present

## 2018-11-22 DIAGNOSIS — Z6833 Body mass index (BMI) 33.0-33.9, adult: Secondary | ICD-10-CM

## 2018-11-22 MED ORDER — NALTREXONE-BUPROPION HCL ER 8-90 MG PO TB12: ORAL_TABLET | ORAL | 6 refills | Status: DC

## 2018-11-22 MED ORDER — VALACYCLOVIR HCL 1 G PO TABS: 2000.0000 mg | ORAL_TABLET | Freq: Two times a day (BID) | ORAL | 12 refills | Status: DC

## 2018-11-22 NOTE — Assessment & Plan Note (Signed)
Not often enough to be on suppressive medicine. If happening more than 12x a year, will let us know and we will get her on suppressive medicine. Call with any concerns. Refills given.

## 2018-11-22 NOTE — Assessment & Plan Note (Signed)
Follows with hematology. Stable. Continue to monitor.

## 2018-11-22 NOTE — Telephone Encounter (Signed)
Does patient need OV for medication refills? Please advise.

## 2018-11-22 NOTE — Progress Notes (Signed)
Ht 5\' 8"  (1.727 m)   Wt 220 lb (99.8 kg)   BMI 33.45 kg/m    Subjective:    Patient ID: Rachel Salas, female    DOB: 1975/11/20, 43 y.o.   MRN: [id number]  HPI: Rachel Salas is a 43 y.o. female  Chief Complaint  Patient presents with  . Obesity    Needs refill on Contrave. Patient states no other complaints.  . Mouth Lesions    Needs refill on Valtrex   WEIGHT GAIN- has been on contrave, has been tolerating her medicine well. No side effects. Does feel like she's plateaued Duration: chronic Previous attempts at weight loss: yes Complications of obesity: HTN, pseudotumor cerebrii Peak weight: 274 Weight loss goal: to be healthy Weight loss to date: 54lbs! Requesting obesity pharmacotherapy: yes Current weight loss supplements/medications: yes  Continues with cold sores- She notes that she got sick a few weeks ago with bad cold sores. She thinks that she has had outbreaks every couple of months, but not monthly. She does need a refill on her valtrex. She doesn't feel like she needs it daily. She is otherwise doing well with no other concerns or complaints at this time.    Relevant past medical, surgical, family and social history reviewed and updated as indicated. Interim medical history since our last visit reviewed. Allergies and medications reviewed and updated.  Review of Systems  Constitutional: Negative.   HENT: Negative.   Respiratory: Negative.   Cardiovascular: Negative.   Musculoskeletal: Negative.   Neurological: Negative.   Psychiatric/Behavioral: Negative.     Per HPI unless specifically indicated above     Objective:    Ht 5\' 8"  (1.727 m)   Wt 220 lb (99.8 kg)   BMI 33.45 kg/m   Wt Readings from Last 3 Encounters:  11/22/18 220 lb (99.8 kg)  08/13/18 226 lb (102.5 kg)  07/02/18 224 lb (101.6 kg)    Physical Exam Vitals signs and nursing note reviewed.  Constitutional:      General: She is not in acute distress.    Appearance: Normal  appearance. She is not ill-appearing, toxic-appearing or diaphoretic.  HENT:     Head: Normocephalic and atraumatic.     Right Ear: External ear normal.     Left Ear: External ear normal.     Nose: Nose normal.     Mouth/Throat:     Mouth: Mucous membranes are moist.     Pharynx: Oropharynx is clear.  Eyes:     General: No scleral icterus.       Right eye: No discharge.        Left eye: No discharge.     Conjunctiva/sclera: Conjunctivae normal.     Pupils: Pupils are equal, round, and reactive to light.  Neck:     Musculoskeletal: Normal range of motion.  Pulmonary:     Effort: Pulmonary effort is normal. No respiratory distress.     Comments: Speaking in full sentences Musculoskeletal: Normal range of motion.  Skin:    Coloration: Skin is not jaundiced or pale.     Findings: No bruising, erythema, lesion or rash.  Neurological:     Mental Status: She is alert and oriented to person, place, and time. Mental status is at baseline.  Psychiatric:        Mood and Affect: Mood normal.        Behavior: Behavior normal.        Thought Content: Thought content normal.  Judgment: Judgment normal.     Results for orders placed or performed in visit on 11/02/18  CBC with Differential  Result Value Ref Range   WBC 7.0 4.0 - 10.5 K/uL   RBC 4.09 3.87 - 5.11 MIL/uL   Hemoglobin 11.3 (L) 12.0 - 15.0 g/dL   HCT 16.134.9 (L) 09.636.0 - 04.546.0 %   MCV 85.3 80.0 - 100.0 fL   MCH 27.6 26.0 - 34.0 pg   MCHC 32.4 30.0 - 36.0 g/dL   RDW 40.913.6 81.111.5 - 91.415.5 %   Platelets 187 150 - 400 K/uL   nRBC 0.0 0.0 - 0.2 %   Neutrophils Relative % 63 %   Neutro Abs 4.4 1.7 - 7.7 K/uL   Lymphocytes Relative 29 %   Lymphs Abs 2.1 0.7 - 4.0 K/uL   Monocytes Relative 6 %   Monocytes Absolute 0.4 0.1 - 1.0 K/uL   Eosinophils Relative 2 %   Eosinophils Absolute 0.1 0.0 - 0.5 K/uL   Basophils Relative 0 %   Basophils Absolute 0.0 0.0 - 0.1 K/uL   Immature Granulocytes 0 %   Abs Immature Granulocytes 0.02  0.00 - 0.07 K/uL      Assessment & Plan:   Problem List Items Addressed This Visit      Digestive   Recurrent cold sores    Not often enough to be on suppressive medicine. If happening more than 12x a year, will let us know and we will get her on suppressive medicine. Call with any concerns. Refills given.       Relevant Medications   valACYclovir (VALTREX) 1000 MG tablet     Other   Obesity - Primary    Doing much better. Congratulated patient on 54lb weigh loss! Continue contrave. Continue to monitor. Call with any concerns.       Relevant Medications   Naltrexone-buPROPion HCl ER (CONTRAVE) 8-90 MG TB12   Iron deficiency anemia due to chronic blood loss    Follows with hematology. Stable. Continue to monitor.           Follow up plan: Return in about 6 months (around 05/25/2019) for Physical.    . This visit was completed via FaceTime due to the restrictions of the COVID-19 pandemic. All issues as above were discussed and addressed. Physical exam was done as above through visual confirmation on FaceTime. If it was felt that the patient should be evaluated in the office, they were directed there. The patient verbally consented to this visit. . Location of the patient: parking lot . Location of the provider: home . Those involved with this call:  . Provider: Olevia PerchesMegan Adreena Willits, DO . CMA: Myrtha MantisKeri Bullock, CMA . Front Desk/Registration: Adela Portshristan Williamson  . Time spent on call: 25 minutes with patient face to face via video conference. More than 50% of this time was spent in counseling and coordination of care. 40 minutes total spent in review of patient's record and preparation of their chart.

## 2018-11-22 NOTE — Assessment & Plan Note (Signed)
Doing much better. Congratulated patient on 54lb weigh loss! Continue contrave. Continue to monitor. Call with any concerns.

## 2019-01-31 ENCOUNTER — Other Ambulatory Visit: Payer: Self-pay

## 2019-02-01 ENCOUNTER — Inpatient Hospital Stay: Payer: Self-pay | Admitting: Internal Medicine

## 2019-02-01 ENCOUNTER — Inpatient Hospital Stay: Payer: Self-pay

## 2019-02-01 NOTE — Progress Notes (Deleted)
[facility] INTERVAL NOTE  Patient Care Team: Dorcas CarrowJohnson, Megan P, DO as PCP - General (Family Medicine)  CHIEF COMPLAINTS/PURPOSE OF CONSULTATION:  Follow up & consideration of Venofer  HEMATOLOGY HISTORY:  # CHRONIC ANEMIA- iron deficiency likely from menorrhagia [ EGD/colonoscopy/capsule-none]   HISTORY OF PRESENTING ILLNESS:  Rachel Salas 43 y.o.  female, initially referred for evaluation for longstanding history of anemia. Per patient, anemia her 'whole life'. She has longstanding history of menorrhagia and previously had IUD placed which unfortunately did not help with her heavy bleeding and was ultimately removed. She previously received IV iron in 2016. She is unable to tolerate oral iron because of intolerance/gastric discomfort.   Blood in stools: None; stool occult blood negative. Change in bowel habits- None Blood in urine: None Vaginal bleeding: heavy periods s/p IUD w/o improvement in symptoms; removed Difficulty swallowing: None Abnormal weight loss: None Iron supplementation: not on PO iron sec to discomfort.   Interval History: Suffered a possible infusion reaction to Feraheme at her last infusion. Complained of chest heaviness/pressure and arm pain. Feraheme was stopped and she received iv fluids, benadryl and solumedrol with resolution of symptoms.  She has heavy menstrual periods. None today but anticipates period in next 1-2 weeks. Continues to feel tired. No pica.    Review of Systems  Constitutional: Positive for malaise/fatigue. Negative for diaphoresis, fever and weight loss.  HENT: Negative for nosebleeds and sore throat.   Eyes: Negative for double vision.  Respiratory: Negative for cough, hemoptysis, sputum production, shortness of breath and wheezing.   Cardiovascular: Negative for chest pain, palpitations, orthopnea and leg swelling.  Gastrointestinal: Negative for abdominal pain, blood in stool, constipation, diarrhea, heartburn, melena,  nausea and vomiting.  Genitourinary: Negative for dysuria, frequency and urgency.  Musculoskeletal: Positive for myalgias. Negative for back pain and joint pain.  Skin: Negative.  Negative for itching and rash.  Neurological: Negative for dizziness, tingling, focal weakness, weakness and headaches.  Endo/Heme/Allergies: Does not bruise/bleed easily.  Psychiatric/Behavioral: Negative for depression. The patient is not nervous/anxious and does not have insomnia.     MEDICAL HISTORY:  Past Medical History:  Diagnosis Date  . Adenomyosis 11/15/2014   u/s suspicious for adenomyosis  . Essential hypertension, benign 10/14/2012  . GERD (gastroesophageal reflux disease)   . Hypertension   . Hypokalemia   . IDA (iron deficiency anemia)   . Menorrhagia with irregular cycle   . Pseudotumor cerebri dx 2007  . Severe obesity (HCC)     SURGICAL HISTORY: Past Surgical History:  Procedure Laterality Date  . TONSILLECTOMY  2007  . TONSILLECTOMY  2007  . TUBAL LIGATION  2002    SOCIAL HISTORY: Social History   Socioeconomic History  . Marital status: Married    Spouse name: Not on file  . Number of children: 3  . Years of education: BS  . Highest education level: Not on file  Occupational History    Employer: [employer]  Social Needs  . Financial resource strain: Not on file  . Food insecurity    Worry: Not on file    Inability: Not on file  . Transportation needs    Medical: Not on file    Non-medical: Not on file  Tobacco Use  . Smoking status: Never Smoker  . Smokeless tobacco: Never Used  Substance and Sexual Activity  . Alcohol use: Yes    Alcohol/week: 0.0 standard drinks    Comment: Rare/Occasional  . Drug use: No  . Sexual  activity: Yes    Partners: Male    Birth control/protection: None, Surgical    Comment: BTL  Lifestyle  . Physical activity    Days per week: Not on file    Minutes per session: Not on file  . Stress: Not on file  Relationships  . Social  Herbalist on phone: Not on file    Gets together: Not on file    Attends religious service: Not on file    Active member of club or organization: Not on file    Attends meetings of clubs or organizations: Not on file    Relationship status: Not on file  . Intimate partner violence    Fear of current or ex partner: Not on file    Emotionally abused: Not on file    Physically abused: Not on file    Forced sexual activity: Not on file  Other Topics Concern  . 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     FAMILY HISTORY: Family History  Problem Relation Age of Onset  . Hypertension Mother   . Hypercholesterolemia Mother   . Thyroid disease Mother   . Stroke Maternal Grandmother   . Colon cancer Neg Hx   . Breast cancer Neg Hx   . Ovarian cancer Neg Hx   . Heart disease Neg Hx     ALLERGIES:  is allergic to tylenol [acetaminophen].  MEDICATIONS:  Current Outpatient Medications  Medication Sig Dispense Refill  . cyclobenzaprine (FLEXERIL) 10 MG tablet Take 1 tablet (10 mg total) by mouth at bedtime. 30 tablet 0  . Naltrexone-buPROPion HCl ER (CONTRAVE) 8-90 MG TB12 2 tabs BID 120 tablet 6  . valACYclovir (VALTREX) 1000 MG tablet Take 2 tablets (2,000 mg total) by mouth 2 (two) times daily. For 1 day. Start as soon as you feel the symptoms starting 20 tablet 12   No current facility-administered medications for this visit.     PHYSICAL EXAMINATION: There were no vitals filed for this visit. There were no vitals filed for this visit.  Physical Exam  Constitutional: She is oriented to person, place, and time and well-developed, well-nourished, and in no distress.  HENT:  Head: Normocephalic and atraumatic.  Mouth/Throat: Oropharynx is clear and moist. No oropharyngeal exudate.  Eyes: Pupils are equal, round, and reactive to light.  Neck:  Normal range of motion. Neck supple.  Cardiovascular: Normal rate and regular rhythm.  Pulmonary/Chest: No respiratory distress. She has no wheezes.  Abdominal: Soft. Bowel sounds are normal. She exhibits no distension and no mass. There is no abdominal tenderness. There is no rebound and no guarding.  Musculoskeletal: Normal range of motion.        General: No tenderness or edema.  Neurological: She is alert and oriented to person, place, and time.  Skin: Skin is warm. There is pallor.  Psychiatric: Affect normal.    LABORATORY DATA:  I have reviewed the data as listed Lab Results  Component Value Date   WBC 7.0 11/02/2018   HGB 11.3 (L) 11/02/2018   HCT 34.9 (L) 11/02/2018   MCV 85.3 11/02/2018   PLT 187 11/02/2018   Recent Labs    07/02/18 1212 08/13/18 1300  NA 138 139  K 3.7 3.3*  CL 109 109  CO2 24 24  GLUCOSE 99 108*  BUN 20  14  CREATININE 0.91 0.98  CALCIUM 8.6* 8.4*  GFRNONAA >60 >60  GFRAA >60 >60  PROT 7.5 7.3  ALBUMIN 3.5 3.5  AST 11* 13*  ALT 7 10  ALKPHOS 61 61  BILITOT 0.4 0.4   No results found.  Assessment & Plan No problem-specific Assessment & Plan notes found for this encounter.  - Iron Deficiency anemia- secondary to menorrhagia. Suffered infusion reaction to feraheme on dose 3. Received 2 doses. Hmg improved to 10.1 today. Iron studies pending. Persistent microcytosis & Symptomatic. Proceed with Venofer x 4 then rtc in 6 weeks for cbc and follow up with Dr. Donneta RombergBrahmanday.   - Menorrhagia- s/p IUD without improvement, now removed. Follow up with gynecology for continued management.   All patient questions were answered. The patient knows to call the clinic with any problems, questions or concerns.  Consuello MasseLauren Allen, DNP, AGNP-C [facility] [phone] (work cell) [phone] (office)  CC: Dr. Donneta RombergBrahmanday

## 2019-02-01 NOTE — Assessment & Plan Note (Deleted)
#   IDA sec to menorrahgia-status post IV Feraheme-hemoglobin today is 11.3.  Significantly improved from 6.7 approximately 5 months ago.  #Recommend p.o. iron 1 pill every other day/gastric upset.  Hold off IV iron infusion given pandemic.  #History of menorrhagia-defer management to PCP/gynecology-regarding birth control pills.  # DISPOSITION: #Follow-up in 3 months-MD/CBC/BMP-possible Feraheme-Dr.B

## 2019-02-09 ENCOUNTER — Other Ambulatory Visit: Payer: Self-pay | Admitting: Internal Medicine

## 2019-02-09 NOTE — Progress Notes (Signed)
Changed from Venofer to [facility]

## 2019-02-10 ENCOUNTER — Inpatient Hospital Stay: Payer: Self-pay

## 2019-02-10 ENCOUNTER — Inpatient Hospital Stay: Payer: Self-pay | Admitting: Internal Medicine

## 2019-02-28 ENCOUNTER — Inpatient Hospital Stay: Payer: PRIVATE HEALTH INSURANCE

## 2019-02-28 ENCOUNTER — Inpatient Hospital Stay: Payer: PRIVATE HEALTH INSURANCE | Attending: Internal Medicine

## 2019-02-28 ENCOUNTER — Inpatient Hospital Stay (HOSPITAL_BASED_OUTPATIENT_CLINIC_OR_DEPARTMENT_OTHER): Payer: PRIVATE HEALTH INSURANCE | Admitting: Internal Medicine

## 2019-02-28 ENCOUNTER — Other Ambulatory Visit: Payer: Self-pay

## 2019-02-28 VITALS — BP 136/88 | HR 90 | Temp 97.7°F | Resp 20

## 2019-02-28 DIAGNOSIS — Z8349 Family history of other endocrine, nutritional and metabolic diseases: Secondary | ICD-10-CM | POA: Diagnosis not present

## 2019-02-28 DIAGNOSIS — R5383 Other fatigue: Secondary | ICD-10-CM | POA: Diagnosis not present

## 2019-02-28 DIAGNOSIS — K30 Functional dyspepsia: Secondary | ICD-10-CM | POA: Diagnosis not present

## 2019-02-28 DIAGNOSIS — N920 Excessive and frequent menstruation with regular cycle: Secondary | ICD-10-CM | POA: Diagnosis present

## 2019-02-28 DIAGNOSIS — Z8249 Family history of ischemic heart disease and other diseases of the circulatory system: Secondary | ICD-10-CM | POA: Insufficient documentation

## 2019-02-28 DIAGNOSIS — D500 Iron deficiency anemia secondary to blood loss (chronic): Secondary | ICD-10-CM

## 2019-02-28 DIAGNOSIS — Z886 Allergy status to analgesic agent status: Secondary | ICD-10-CM | POA: Diagnosis not present

## 2019-02-28 DIAGNOSIS — M791 Myalgia, unspecified site: Secondary | ICD-10-CM | POA: Insufficient documentation

## 2019-02-28 DIAGNOSIS — R0789 Other chest pain: Secondary | ICD-10-CM | POA: Diagnosis not present

## 2019-02-28 DIAGNOSIS — Z79899 Other long term (current) drug therapy: Secondary | ICD-10-CM | POA: Insufficient documentation

## 2019-02-28 DIAGNOSIS — Z823 Family history of stroke: Secondary | ICD-10-CM | POA: Insufficient documentation

## 2019-02-28 LAB — BASIC METABOLIC PANEL
Anion gap: 8 (ref 5–15)
BUN: 12 mg/dL (ref 6–20)
CO2: 24 mmol/L (ref 22–32)
Calcium: 8.3 mg/dL — ABNORMAL LOW (ref 8.9–10.3)
Chloride: 107 mmol/L (ref 98–111)
Creatinine, Ser: 1.07 mg/dL — ABNORMAL HIGH (ref 0.44–1.00)
GFR calc Af Amer: >60 mL/min (ref >60)
GFR calc non Af Amer: >60 mL/min (ref >60)
Glucose, Bld: 151 mg/dL — ABNORMAL HIGH (ref 70–99)
Potassium: 3.2 mmol/L — ABNORMAL LOW (ref 3.5–5.1)
Sodium: 139 mmol/L (ref 135–145)

## 2019-02-28 LAB — IRON AND TIBC
Iron: 20 ug/dL — ABNORMAL LOW (ref 28–170)
Saturation Ratios: 7 % — ABNORMAL LOW (ref 10.4–31.8)
TIBC: 269 ug/dL (ref 250–450)
UIBC: 249 ug/dL

## 2019-02-28 LAB — CBC WITH DIFFERENTIAL/PLATELET
Abs Immature Granulocytes: 0.02 10*3/uL (ref 0.00–0.07)
Basophils Absolute: 0 10*3/uL (ref 0.0–0.1)
Basophils Relative: 0 %
Eosinophils Absolute: 0.1 10*3/uL (ref 0.0–0.5)
Eosinophils Relative: 1 %
HCT: 31.9 % — ABNORMAL LOW (ref 36.0–46.0)
Hemoglobin: 10.4 g/dL — ABNORMAL LOW (ref 12.0–15.0)
Immature Granulocytes: 0 %
Lymphocytes Relative: 25 %
Lymphs Abs: 1.2 10*3/uL (ref 0.7–4.0)
MCH: 26.7 pg (ref 26.0–34.0)
MCHC: 32.6 g/dL (ref 30.0–36.0)
MCV: 81.8 fL (ref 80.0–100.0)
Monocytes Absolute: 0.3 10*3/uL (ref 0.1–1.0)
Monocytes Relative: 6 %
Neutro Abs: 3.4 10*3/uL (ref 1.7–7.7)
Neutrophils Relative %: 68 %
Platelets: 171 10*3/uL (ref 150–400)
RBC: 3.9 MIL/uL (ref 3.87–5.11)
RDW: 13.6 % (ref 11.5–15.5)
WBC: 5.1 10*3/uL (ref 4.0–10.5)
nRBC: 0 % (ref 0.0–0.2)

## 2019-02-28 LAB — FERRITIN: Ferritin: 25 ng/mL (ref 11–307)

## 2019-02-28 MED ORDER — SODIUM CHLORIDE 0.9 % IV SOLN
Freq: Once | INTRAVENOUS | Status: DC
  Filled 2019-02-28: qty 250

## 2019-02-28 MED ORDER — SODIUM CHLORIDE 0.9 % IV SOLN
Freq: Once | INTRAVENOUS | Status: AC
  Administered 2019-02-28: 14:00:00 via INTRAVENOUS
  Filled 2019-02-28: qty 250

## 2019-02-28 MED ORDER — IRON SUCROSE 20 MG/ML IV SOLN
200.0000 mg | Freq: Once | INTRAVENOUS | Status: AC
  Administered 2019-02-28: 200 mg via INTRAVENOUS
  Filled 2019-02-28: qty 10

## 2019-02-28 MED ORDER — SODIUM CHLORIDE 0.9 % IV SOLN: 200.0000 mg | Freq: Once | INTRAVENOUS | Status: DC

## 2019-02-28 MED ORDER — SODIUM CHLORIDE 0.9 % IV SOLN
510.0000 mg | Freq: Once | INTRAVENOUS | Status: DC
  Filled 2019-02-28: qty 17

## 2019-02-28 NOTE — Assessment & Plan Note (Addendum)
#   IDA sec to menorrahgia-status post IV Feraheme-hemoglobin today is 10.5  Significantly improved from 6.7 approximately 8 months ago.  #Today hemoglobin is 10.4; slightly lower from 3 months ago when the hemoglobin was 11.  Recommend IV Venofer [reaction to Feraheme].  Patient intolerant to p.o. iron.  #History of menorrhagia-defer management to PCP/gynecology-regarding birth control pills.  Not improved.  # DISPOSITION:add iron studies/ferritin # Ferrahem today; and again in 1 week #Follow-up in 4 months-MD/CBC; possible Feraheme-Dr.B

## 2019-02-28 NOTE — Progress Notes (Signed)
[facility] CONSULT NOTE  Patient Care Team: Dorcas CarrowJohnson, Megan P, DO as PCP - General (Family Medicine)  CHIEF COMPLAINTS/PURPOSE OF CONSULTATION:    HEMATOLOGY HISTORY  # CHRONIC ANEMIA- iron deficiency likely from menorrhagia [ EGD/colonoscopy/capsule-none] s/p Ferrahem x3 [? Reaction to Ferrahem-chest tightness/heaviness]; Aug 2020- change to Venofer.  ;  HISTORY OF PRESENTING ILLNESS:  Rachel Salas 43 y.o.  female with a history of iron deficient anemia likely secondary to heavy menstrual.  She is here for follow-up.  Patient is not on p.o. iron because of poor tolerance/stomach upset.  Patient hemoglobin improved post IV Feraheme.  However with the third cycle patient had a "reaction".  Patient had chest tightness heaviness after the third infusion of Feraheme.  Patient continues to complain of fatigue.  Mild improvement but not a whole lot improvement.  She continues to have heavy menstrual cycles.  She had her IUD taken out.  She is not currently on any birth control pills.  Denies any blood in stools or black or stools.  Review of Systems  Constitutional: Positive for malaise/fatigue. Negative for diaphoresis, fever and weight loss.  HENT: Negative for nosebleeds and sore throat.   Eyes: Negative for double vision.  Respiratory: Negative for cough, hemoptysis, sputum production, shortness of breath and wheezing.   Cardiovascular: Negative for chest pain, palpitations, orthopnea and leg swelling.  Gastrointestinal: Negative for abdominal pain, blood in stool, constipation, diarrhea, heartburn, melena, nausea and vomiting.  Genitourinary: Negative for dysuria, frequency and urgency.  Musculoskeletal: Positive for myalgias. Negative for back pain and joint pain.  Skin: Negative.  Negative for itching and rash.  Neurological: Negative for dizziness, tingling, focal weakness, weakness and headaches.  Endo/Heme/Allergies: Does not bruise/bleed easily.   Psychiatric/Behavioral: Negative for depression. The patient is not nervous/anxious and does not have insomnia.     MEDICAL HISTORY:  Past Medical History:  Diagnosis Date  . Adenomyosis 11/15/2014   u/s suspicious for adenomyosis  . Essential hypertension, benign 10/14/2012  . GERD (gastroesophageal reflux disease)   . Hypertension   . Hypokalemia   . IDA (iron deficiency anemia)   . Menorrhagia with irregular cycle   . Pseudotumor cerebri dx 2007  . Severe obesity (HCC)     SURGICAL HISTORY: Past Surgical History:  Procedure Laterality Date  . TONSILLECTOMY  2007  . TONSILLECTOMY  2007  . TUBAL LIGATION  2002    SOCIAL HISTORY: Social History   Socioeconomic History  . Marital status: Married    Spouse name: Not on file  . Number of children: 3  . Years of education: BS  . Highest education level: Not on file  Occupational History    Employer: [employer]  Social Needs  . Financial resource strain: Not on file  . Food insecurity    Worry: Not on file    Inability: Not on file  . Transportation needs    Medical: Not on file    Non-medical: Not on file  Tobacco Use  . Smoking status: Never Smoker  . Smokeless tobacco: Never Used  Substance and Sexual Activity  . Alcohol use: Yes    Alcohol/week: 0.0 standard drinks    Comment: Rare/Occasional  . Drug use: No  . Sexual activity: Yes    Partners: Male    Birth control/protection: None, Surgical    Comment: BTL  Lifestyle  . Physical activity    Days per week: Not on file    Minutes per session: Not on file  .  Stress: Not on file  Relationships  . Social Musicianconnections    Talks on phone: Not on file    Gets together: Not on file    Attends religious service: Not on file    Active member of club or organization: Not on file    Attends meetings of clubs or organizations: Not on file    Relationship status: Not on file  . Intimate partner violence    Fear of current or ex partner: Not on file     Emotionally abused: Not on file    Physically abused: Not on file    Forced sexual activity: Not on file  Other Topics Concern  . 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     FAMILY HISTORY: Family History  Problem Relation Age of Onset  . Hypertension Mother   . Hypercholesterolemia Mother   . Thyroid disease Mother   . Stroke Maternal Grandmother   . Colon cancer Neg Hx   . Breast cancer Neg Hx   . Ovarian cancer Neg Hx   . Heart disease Neg Hx     ALLERGIES:  is allergic to tylenol [acetaminophen] and other.  MEDICATIONS:  Current Outpatient Medications  Medication Sig Dispense Refill  . cyclobenzaprine (FLEXERIL) 10 MG tablet Take 1 tablet (10 mg total) by mouth at bedtime. 30 tablet 0  . Naltrexone-buPROPion HCl ER (CONTRAVE) 8-90 MG TB12 2 tabs BID 120 tablet 6  . valACYclovir (VALTREX) 1000 MG tablet Take 2 tablets (2,000 mg total) by mouth 2 (two) times daily. For 1 day. Start as soon as you feel the symptoms starting 20 tablet 12   No current facility-administered medications for this visit.       PHYSICAL EXAMINATION:   Vitals:   02/28/19 1300  BP: (!) 143/93  Pulse: 92  Resp: 20  Temp: (!) 97.4 F (36.3 C)   Filed Weights   02/28/19 1300  Weight: 227 lb (103 kg)    Physical Exam  Constitutional: She is oriented to person, place, and time and well-developed, well-nourished, and in no distress.  HENT:  Head: Normocephalic and atraumatic.  Mouth/Throat: Oropharynx is clear and moist. No oropharyngeal exudate.  Eyes: Pupils are equal, round, and reactive to light.  Neck: Normal range of motion. Neck supple.  Cardiovascular: Normal rate and regular rhythm.  Pulmonary/Chest: No respiratory distress. She has no wheezes.  Abdominal: Soft. Bowel sounds are normal. She exhibits no distension and no mass. There is no abdominal  tenderness. There is no rebound and no guarding.  Musculoskeletal: Normal range of motion.        General: No tenderness or edema.  Neurological: She is alert and oriented to person, place, and time.  Skin: Skin is warm. There is pallor.  Psychiatric: Affect normal.     LABORATORY DATA:  I have reviewed the data as listed Lab Results  Component Value Date   WBC 5.1 02/28/2019   HGB 10.4 (L) 02/28/2019   HCT 31.9 (L) 02/28/2019   MCV 81.8 02/28/2019   PLT 171 02/28/2019   Recent Labs    07/02/18 1212 08/13/18 1300 02/28/19 1258  NA 138 139 139  K 3.7 3.3* 3.2*  CL 109 109 107  CO2 24 24 24   GLUCOSE 99 108* 151*  BUN 20 14 12   CREATININE 0.91 0.98 1.07*  CALCIUM  8.6* 8.4* 8.3*  GFRNONAA >60 >60 >60  GFRAA >60 >60 >60  PROT 7.5 7.3  --   ALBUMIN 3.5 3.5  --   AST 11* 13*  --   ALT 7 10  --   ALKPHOS 61 61  --   BILITOT 0.4 0.4  --      No results found.  Iron deficiency anemia due to chronic blood loss # IDA sec to menorrahgia-status post IV Feraheme-hemoglobin today is 10.5  Significantly improved from 6.7 approximately 8 months ago.  #Today hemoglobin is 10.4; slightly lower from 3 months ago when the hemoglobin was 11.  Recommend IV Venofer [reaction to Feraheme].  Patient intolerant to p.o. iron.  #History of menorrhagia-defer management to PCP/gynecology-regarding birth control pills.  Not improved.  # DISPOSITION:add iron studies/ferritin # Ferrahem today; and again in 1 week #Follow-up in 4 months-MD/CBC; possible Feraheme-Dr.B   All questions were answered. The patient knows to call the clinic with any problems, questions or concerns.   Cammie Sickle, MD 02/28/2019 3:00 PM

## 2019-03-07 ENCOUNTER — Inpatient Hospital Stay: Payer: PRIVATE HEALTH INSURANCE

## 2019-03-14 ENCOUNTER — Inpatient Hospital Stay: Payer: PRIVATE HEALTH INSURANCE

## 2019-03-17 ENCOUNTER — Encounter: Payer: Self-pay | Admitting: Family Medicine

## 2019-03-17 ENCOUNTER — Other Ambulatory Visit: Payer: Self-pay

## 2019-03-17 ENCOUNTER — Ambulatory Visit: Payer: PRIVATE HEALTH INSURANCE | Admitting: Family Medicine

## 2019-03-17 VITALS — BP 143/93 | HR 134 | Temp 98.4°F | Ht 68.0 in | Wt 226.0 lb

## 2019-03-17 DIAGNOSIS — R002 Palpitations: Secondary | ICD-10-CM | POA: Diagnosis not present

## 2019-03-17 MED ORDER — METOPROLOL SUCCINATE ER 25 MG PO TB24: 25.0000 mg | ORAL_TABLET | Freq: Every day | ORAL | 0 refills | Status: DC

## 2019-03-17 NOTE — Progress Notes (Signed)
BP (!) 143/93   Pulse (!) 134   Temp 98.4 F (36.9 C) (Oral)   Ht 5\' 8"  (1.727 m)   Wt 226 lb (102.5 kg)   SpO2 98%   BMI 34.36 kg/m    Subjective:    Patient ID: Rachel Salas, female    DOB: September 12, 1975, 43 y.o.   MRN: [id number]  HPI: Rachel Salas is a 43 y.o. female  Chief Complaint  Patient presents with  . Hypertension    pt states she had had heart palpitations, yesterday   Patient presenting today with concerns about some palpitations she's been experiencing since being at work yesterday. States they started out of the blue and she got very nervous about it so decided to go to the ER. Had some frequent PACs noted on EKG in ER and intermittent tachycardia. CXR benign. Denies CP, SOB, diaphoresis, dizziness, hx of heart issues, drug use. Labs drawn in ER last night personally reviewed and benign. Does take contrave for weight loss but has been on this for about a year now without issue.   Relevant past medical, surgical, family and social history reviewed and updated as indicated. Interim medical history since our last visit reviewed. Allergies and medications reviewed and updated.  Review of Systems  Per HPI unless specifically indicated above     Objective:    BP (!) 143/93   Pulse (!) 134   Temp 98.4 F (36.9 C) (Oral)   Ht 5\' 8"  (1.727 m)   Wt 226 lb (102.5 kg)   SpO2 98%   BMI 34.36 kg/m   Wt Readings from Last 3 Encounters:  03/17/19 226 lb (102.5 kg)  02/28/19 227 lb (103 kg)  11/22/18 220 lb (99.8 kg)    Physical Exam Vitals signs and nursing note reviewed.  Constitutional:      Appearance: Normal appearance. She is not ill-appearing.  HENT:     Head: Atraumatic.  Eyes:     Extraocular Movements: Extraocular movements intact.     Conjunctiva/sclera: Conjunctivae normal.  Neck:     Musculoskeletal: Normal range of motion and neck supple.  Cardiovascular:     Rate and Rhythm: Regular rhythm. Tachycardia present.     Heart sounds: Normal  heart sounds.  Pulmonary:     Effort: Pulmonary effort is normal.     Breath sounds: Normal breath sounds.  Musculoskeletal: Normal range of motion.  Skin:    General: Skin is warm and dry.  Neurological:     Mental Status: She is alert and oriented to person, place, and time.  Psychiatric:        Mood and Affect: Mood normal.        Thought Content: Thought content normal.        Judgment: Judgment normal.    EKG: NSR at 85 bpm with no acute abnormalities noted  Results for orders placed or performed in visit on 02/28/19  Iron and TIBC  Result Value Ref Range   Iron 20 (L) 28 - 170 ug/dL   TIBC 269 250 - 450 ug/dL   Saturation Ratios 7 (L) 10.4 - 31.8 %   UIBC 249 ug/dL  Ferritin  Result Value Ref Range   Ferritin 25 11 - 307 ng/mL      Assessment & Plan:   Problem List Items Addressed This Visit    None    Visit Diagnoses    Heart palpitations    -  Primary   Repeat EKG reassuring,  tachycardia seems intermittent. Labs benign. Refer to Cardiology for further eval and start low dose metoprolol in meantime given sxs   Relevant Orders   EKG 12-Lead (Completed)   Ambulatory referral to Cardiology       Follow up plan: Return in about 4 weeks (around 04/14/2019) for BP.

## 2019-03-19 ENCOUNTER — Other Ambulatory Visit: Payer: Self-pay | Admitting: Family Medicine

## 2019-03-21 MED ORDER — CYCLOBENZAPRINE HCL 10 MG PO TABS: 10.0000 mg | ORAL_TABLET | Freq: Every day | ORAL | 0 refills | Status: DC

## 2019-03-21 NOTE — Telephone Encounter (Signed)
Next apt 04/15/19.

## 2019-04-04 ENCOUNTER — Inpatient Hospital Stay: Payer: PRIVATE HEALTH INSURANCE

## 2019-04-04 ENCOUNTER — Inpatient Hospital Stay: Payer: PRIVATE HEALTH INSURANCE | Admitting: Internal Medicine

## 2019-04-15 ENCOUNTER — Encounter: Payer: Self-pay | Admitting: Family Medicine

## 2019-04-15 ENCOUNTER — Ambulatory Visit: Payer: PRIVATE HEALTH INSURANCE | Admitting: Family Medicine

## 2019-04-15 ENCOUNTER — Other Ambulatory Visit: Payer: Self-pay

## 2019-04-15 VITALS — BP 122/89 | HR 75 | Temp 99.0°F | Ht 68.0 in | Wt 229.0 lb

## 2019-04-15 DIAGNOSIS — R002 Palpitations: Secondary | ICD-10-CM

## 2019-04-15 DIAGNOSIS — I10 Essential (primary) hypertension: Secondary | ICD-10-CM | POA: Diagnosis not present

## 2019-04-15 DIAGNOSIS — D509 Iron deficiency anemia, unspecified: Secondary | ICD-10-CM

## 2019-04-15 DIAGNOSIS — Z23 Encounter for immunization: Secondary | ICD-10-CM

## 2019-04-15 MED ORDER — METOPROLOL SUCCINATE ER 25 MG PO TB24: 25.0000 mg | ORAL_TABLET | Freq: Every day | ORAL | 1 refills | Status: DC

## 2019-04-15 NOTE — Progress Notes (Signed)
BP 122/89   Pulse 75   Temp 99 F (37.2 C) (Oral)   Ht 5\' 8"  (1.727 m)   Wt 229 lb (103.9 kg)   SpO2 100%   BMI 34.82 kg/m    Subjective:    Patient ID: , female    DOB: 18-Jan-1976, 43 y.o.   MRN: [id number]  HPI: Rachel Salas is a 43 y.o. female  Chief Complaint  Patient presents with  . Hypertension    4w f/u   HYPERTENSION Hypertension status: better  Satisfied with current treatment? yes Duration of hypertension: chronic BP monitoring frequency:  not checking BP medication side effects:  no Medication compliance: excellent compliance Previous BP meds:metoprolol Aspirin: no Recurrent headaches: no Visual changes: no Palpitations: no Dyspnea: no Chest pain: no Lower extremity edema: no Dizzy/lightheaded: no  Relevant past medical, surgical, family and social history reviewed and updated as indicated. Interim medical history since our last visit reviewed. Allergies and medications reviewed and updated.  Review of Systems  Constitutional: Negative.   Respiratory: Negative.   Cardiovascular: Negative.   Gastrointestinal: Negative.   Neurological: Negative.   Psychiatric/Behavioral: Negative.     Per HPI unless specifically indicated above     Objective:    BP 122/89   Pulse 75   Temp 99 F (37.2 C) (Oral)   Ht 5\' 8"  (1.727 m)   Wt 229 lb (103.9 kg)   SpO2 100%   BMI 34.82 kg/m   Wt Readings from Last 3 Encounters:  04/15/19 229 lb (103.9 kg)  03/17/19 226 lb (102.5 kg)  02/28/19 227 lb (103 kg)    Physical Exam Vitals signs and nursing note reviewed.  Constitutional:      General: She is not in acute distress.    Appearance: Normal appearance. She is not ill-appearing, toxic-appearing or diaphoretic.  HENT:     Head: Normocephalic and atraumatic.     Right Ear: External ear normal.     Left Ear: External ear normal.     Nose: Nose normal.     Mouth/Throat:     Mouth: Mucous membranes are moist.     Pharynx: Oropharynx  is clear.  Eyes:     General: No scleral icterus.       Right eye: No discharge.        Left eye: No discharge.     Extraocular Movements: Extraocular movements intact.     Conjunctiva/sclera: Conjunctivae normal.     Pupils: Pupils are equal, round, and reactive to light.  Neck:     Musculoskeletal: Normal range of motion and neck supple.  Cardiovascular:     Rate and Rhythm: Normal rate and regular rhythm.     Pulses: Normal pulses.     Heart sounds: Normal heart sounds. No murmur. No friction rub. No gallop.   Pulmonary:     Effort: Pulmonary effort is normal. No respiratory distress.     Breath sounds: Normal breath sounds. No stridor. No wheezing, rhonchi or rales.  Chest:     Chest wall: No tenderness.  Musculoskeletal: Normal range of motion.  Skin:    General: Skin is warm and dry.     Capillary Refill: Capillary refill takes less than 2 seconds.     Coloration: Skin is not jaundiced or pale.     Findings: No bruising, erythema, lesion or rash.  Neurological:     General: No focal deficit present.     Mental Status: She is alert and  oriented to person, place, and time. Mental status is at baseline.  Psychiatric:        Mood and Affect: Mood normal.        Behavior: Behavior normal.        Thought Content: Thought content normal.        Judgment: Judgment normal.     Results for orders placed or performed in visit on 02/28/19  Iron and TIBC  Result Value Ref Range   Iron 20 (L) 28 - 170 ug/dL   TIBC 269 250 - 450 ug/dL   Saturation Ratios 7 (L) 10.4 - 31.8 %   UIBC 249 ug/dL  Ferritin  Result Value Ref Range   Ferritin 25 11 - 307 ng/mL      Assessment & Plan:   Problem List Items Addressed This Visit      Cardiovascular and Mediastinum   HTN (hypertension) - Primary    Under good control on current regimen. Continue current regimen. Continue to monitor. Call with any concerns. Refills given. Labs drawn today.       Relevant Medications   metoprolol  succinate (TOPROL-XL) 25 MG 24 hr tablet     Other   Anemia    Rechecking labs today. Continue to monitor. Call with any concerns.       Relevant Orders   CBC with Differential/Platelet    Other Visit Diagnoses    Heart palpitations       Better on metoprolol. Call with any concerns.   Relevant Orders   Basic metabolic panel   Flu vaccine need       Flu shot given today.   Relevant Orders   Flu Vaccine QUAD 36+ mos IM (Completed)       Follow up plan: Return As schedule.

## 2019-04-15 NOTE — Assessment & Plan Note (Signed)
Under good control on current regimen. Continue current regimen. Continue to monitor. Call with any concerns. Refills given. Labs drawn today.   

## 2019-04-15 NOTE — Assessment & Plan Note (Signed)
Rechecking labs today. Continue to monitor. Call with any concerns.  °

## 2019-04-16 LAB — BASIC METABOLIC PANEL
BUN/Creatinine Ratio: 14 (ref 9–23)
BUN: 14 mg/dL (ref 6–24)
CO2: 24 mmol/L (ref 20–29)
Calcium: 8.7 mg/dL (ref 8.7–10.2)
Chloride: 107 mmol/L — ABNORMAL HIGH (ref 96–106)
Creatinine, Ser: 0.98 mg/dL (ref 0.57–1.00)
GFR calc Af Amer: 83 mL/min/{1.73_m2} (ref >59)
GFR calc non Af Amer: 72 mL/min/{1.73_m2} (ref >59)
Glucose: 90 mg/dL (ref 65–99)
Potassium: 3.7 mmol/L (ref 3.5–5.2)
Sodium: 142 mmol/L (ref 134–144)

## 2019-04-16 LAB — CBC WITH DIFFERENTIAL/PLATELET
Basophils Absolute: 0 10*3/uL (ref 0.0–0.2)
Basos: 0 %
EOS (ABSOLUTE): 0.1 10*3/uL (ref 0.0–0.4)
Eos: 2 %
Hematocrit: 30.8 % — ABNORMAL LOW (ref 34.0–46.6)
Hemoglobin: 9.9 g/dL — ABNORMAL LOW (ref 11.1–15.9)
Immature Grans (Abs): 0 10*3/uL (ref 0.0–0.1)
Immature Granulocytes: 0 %
Lymphocytes Absolute: 2 10*3/uL (ref 0.7–3.1)
Lymphs: 30 %
MCH: 25.7 pg — ABNORMAL LOW (ref 26.6–33.0)
MCHC: 32.1 g/dL (ref 31.5–35.7)
MCV: 80 fL (ref 79–97)
Monocytes Absolute: 0.3 10*3/uL (ref 0.1–0.9)
Monocytes: 5 %
Neutrophils Absolute: 4.2 10*3/uL (ref 1.4–7.0)
Neutrophils: 63 %
Platelets: 238 10*3/uL (ref 150–450)
RBC: 3.85 x10E6/uL (ref 3.77–5.28)
RDW: 15.3 % (ref 11.7–15.4)
WBC: 6.7 10*3/uL (ref 3.4–10.8)

## 2019-05-26 ENCOUNTER — Encounter: Payer: PRIVATE HEALTH INSURANCE | Admitting: Family Medicine

## 2019-06-06 ENCOUNTER — Encounter: Payer: PRIVATE HEALTH INSURANCE | Admitting: Family Medicine

## 2019-06-27 ENCOUNTER — Encounter: Payer: Self-pay | Admitting: Family Medicine

## 2019-06-30 ENCOUNTER — Ambulatory Visit (INDEPENDENT_AMBULATORY_CARE_PROVIDER_SITE_OTHER): Payer: PRIVATE HEALTH INSURANCE | Admitting: Family Medicine

## 2019-06-30 ENCOUNTER — Other Ambulatory Visit: Payer: Self-pay

## 2019-06-30 ENCOUNTER — Encounter: Payer: Self-pay | Admitting: Family Medicine

## 2019-06-30 DIAGNOSIS — M67432 Ganglion, left wrist: Secondary | ICD-10-CM

## 2019-06-30 DIAGNOSIS — G5622 Lesion of ulnar nerve, left upper limb: Secondary | ICD-10-CM

## 2019-06-30 DIAGNOSIS — M67431 Ganglion, right wrist: Secondary | ICD-10-CM | POA: Diagnosis not present

## 2019-06-30 NOTE — Patient Instructions (Addendum)
Cubital Tunnel Syndrome  Cubital tunnel syndrome is a condition that causes pain and weakness of the forearm and hand. It happens when one of the nerves that runs along the inside of the elbow joint (ulnar nerve) becomes irritated. This condition is usually caused by repeated arm motions that are done during sports or work-related activities. What are the causes? This condition may be caused by:  Increased pressure on the ulnar nerve at the elbow, arm, or forearm. This can result from: ? Irritation caused by repeated elbow bending. ? Poorly healed elbow fractures. ? Tumors in the elbow. These are usually noncancerous (benign). ? Scar tissue that develops in the elbow after an injury. ? Bony growths (spurs) near the ulnar nerve.  Stretching of the nerve due to loose elbow ligaments.  Trauma to the nerve at the elbow. What increases the risk? The following factors may make you more likely to develop this condition:  Doing manual labor that requires frequent bending of the elbow.  Playing sports that include repeated or strenuous throwing motions, such as baseball.  Playing contact sports, such as football or lacrosse.  Not warming up properly before activities.  Having diabetes.  Having an underactive thyroid (hypothyroidism). What are the signs or symptoms? Symptoms of this condition include:  Clumsiness or weakness of the hand.  Tenderness of the inner elbow.  Aching or soreness of the inner elbow, forearm, or fingers, especially the little finger or the ring finger.  Increased pain when forcing the elbow to bend.  Reduced control when throwing objects.  Tingling, numbness, or a burning feeling inside the forearm or in part of the hand or fingers, especially the little finger or the ring finger.  Sharp pains that shoot from the elbow down to the wrist and hand.  The inability to grip or pinch hard. How is this diagnosed? This condition is diagnosed based on:  Your  symptoms and medical history. Your health care provider will also ask for details about any injury.  A physical exam. You may also have tests, including:  Electromyogram (EMG). This test measures electrical signals sent by your nerves into the muscles.  Nerve conduction study. This test measures how well electrical signals pass through your nerves.  Imaging tests, such as X-rays, ultrasound, and MRI. These tests check for possible causes of your condition. How is this treated? This condition may be treated by:  Stopping the activities that are causing your symptoms to get worse.  Icing and taking medicines to reduce pain and swelling.  Wearing a splint to prevent your elbow from bending, or wearing an elbow pad where the ulnar nerve is closest to the skin.  Working with a physical therapist in less severe cases. This may help to: ? Decrease your symptoms. ? Improve the strength and range of motion of your elbow, forearm, and hand. If these treatments do not help, surgery may be needed. Follow these instructions at home: If you have a splint:  Wear the splint as told by your health care provider. Remove it only as told by your health care provider.  Loosen the splint if your fingers tingle, become numb, or turn cold and blue.  Keep the splint clean.  If the splint is not waterproof: ? Do not let it get wet. ? Cover it with a watertight covering when you take a bath or shower. Managing pain, stiffness, and swelling   If directed, put ice on the injured area: ? Put ice in a plastic bag. ?   Place a towel between your skin and the bag. ? Leave the ice on for 20 minutes, 2-3 times a day.  Move your fingers often to avoid stiffness and to lessen swelling.  Raise (elevate) the injured area above the level of your heart while you are sitting or lying down. General instructions  Take over-the-counter and prescription medicines only as told by your health care provider.  Do any  exercise or physical therapy as told by your health care provider.  Do not drive or use heavy machinery while taking prescription pain medicine.  If you were given an elbow pad, wear it as told by your health care provider.  Keep all follow-up visits as told by your health care provider. This is important. Contact a health care provider if:  Your symptoms get worse.  Your symptoms do not get better with treatment.  You have new pain.  Your hand on the injured side feels numb or cold. Summary  Cubital tunnel syndrome is a condition that causes pain and weakness of the forearm and hand.  You are more likely to develop this condition if you do work or play sports that involve repeated arm movements.  This condition is often treated by stopping repetitive activities, applying ice, and using anti-inflammatory medicines.  In rare cases, surgery may be needed. This information is not intended to replace advice given to you by your health care provider. Make sure you discuss any questions you have with your health care provider. Document Released: 07/07/2005 Document Revised: 11/23/2017 Document Reviewed: 11/23/2017 Elsevier Patient Education  [employer].  Ulnar Nerve Contusion Rehab Ask your health care provider which exercises are safe for you. Do exercises exactly as told by your health care provider and adjust them as directed. It is normal to feel mild stretching, pulling, tightness, or discomfort as you do these exercises. Stop right away if you feel sudden pain or your pain gets worse. Do not begin these exercises until told by your health care provider. Stretching and range-of-motion exercises These exercises warm up your muscles and joints and improve the movement and flexibility of your forearm. These exercises also help to relieve pain, numbness, and tingling. Wrist flexion, assisted  1. Stand over a tabletop with your left / right hand resting palm-up on the tabletop  and your fingers pointing away from your body. Your arm should be extended, and there should be a slight bend in your elbow. 2. Gently press the back of your hand down (flexion) onto the table by straightening your elbow. You should feel a stretch on the top of your forearm. 3. Hold this position for __________ seconds. 4. Slowly return to the starting position. Repeat __________ times. Complete this exercise __________ times a day. Wrist extension, assisted  1. Stand over a tabletop with your left / right hand resting palm-down on the tabletop and your fingers pointing away from your body. Your arm should be extended, and there should be a slight bend in your elbow. 2. Gently press your fingers and palm down (extension) onto the table by straightening your elbow. You should feel a stretch on the inside of your forearm. 3. Hold this position for __________ seconds. 4. Slowly return to the starting position. Repeat __________ times. Complete this exercise __________ times a day. Ulnar nerve glide, hand moving 1. Stand or sit with your left / right elbow bent and your hand at the height of your shoulder. 2. Move your elbow about 6 inches (15 cm) away  from your body. 3. Gently and slowly wave your hand back and forth (nerve glide). 4. Repeat this motion for __________ seconds. Repeat __________ times. Complete this exercise __________ times a day. Ulnar nerve glide, elbow moving 1. Stand or sit with your left / right arm straight out to your side at shoulder height. 2. Bring your fingers up toward the ceiling so your palm faces away from you. 3. Bend your elbow and bring it to your side and bend your wrist so your palm now faces the floor. 4. Slowly go back and forth between doing the step 2 position and the step 3 position (elbow nerve glide). 5. Repeat these motions for __________ seconds. Repeat __________ times. Complete this exercise __________ times a day. Strengthening exercise This  exercise builds strength and endurance in your forearm. Endurance is the ability to use your muscles for a long time, even after they get tired. Grip  1. Hold one of these items in your left / right hand: a dense sponge, a tennis ball, or a large, rolled sock. 2. Slowly squeeze as hard as you can without increasing any pain. 3. Hold this position for __________ seconds. 4. Slowly release your grip. Repeat __________ times. Complete this exercise __________ times a day. This information is not intended to replace advice given to you by your health care provider. Make sure you discuss any questions you have with your health care provider. Document Released: 07/07/2005 Document Revised: 10/26/2018 Document Reviewed: 08/23/2018 Elsevier Patient Education  2020 [employer].

## 2019-06-30 NOTE — Progress Notes (Signed)
There were no vitals taken for this visit.   Subjective:    Patient ID: Rachel Salas, female    DOB: 10-26-1975, 43 y.o.   MRN: [id number]  HPI: Rachel Salas is a 43 y.o. female  Chief Complaint  Patient presents with  . Numbness    left hand left side, numb sensation and tingling, no actual numbness. Patient has had ganglion cyst in that hand in the past.     NUMBNESS Duration: couple of months Onset: gradual Location: L hand in the 4th and 5th fingers Bilateral: no Symmetric: no Decreased sensation: yes  Weakness: no Pain: no Quality:  Tingling, numb Severity: mild  Frequency: intermittent Trauma: no Recent illness: no Diabetes: no Thyroid disease: no  HIV: no  Alcoholism: no  Spinal cord injury: no Alleviating factors: sleep Aggravating factors: at random Status: stable  Relevant past medical, surgical, family and social history reviewed and updated as indicated. Interim medical history since our last visit reviewed. Allergies and medications reviewed and updated.  Review of Systems  Constitutional: Negative.   Respiratory: Negative.   Cardiovascular: Negative.   Musculoskeletal: Negative.   Neurological: Positive for numbness. Negative for dizziness, tremors, seizures, syncope, facial asymmetry, speech difficulty, weakness, light-headedness and headaches.  Psychiatric/Behavioral: Negative.     Per HPI unless specifically indicated above     Objective:    There were no vitals taken for this visit.  Wt Readings from Last 3 Encounters:  04/15/19 229 lb (103.9 kg)  03/17/19 226 lb (102.5 kg)  02/28/19 227 lb (103 kg)    Physical Exam Vitals and nursing note reviewed.  Constitutional:      General: She is not in acute distress.    Appearance: Normal appearance. She is not ill-appearing, toxic-appearing or diaphoretic.  HENT:     Head: Normocephalic and atraumatic.     Right Ear: External ear normal.     Left Ear: External ear normal.     Nose:  Nose normal.     Mouth/Throat:     Mouth: Mucous membranes are moist.     Pharynx: Oropharynx is clear.  Eyes:     General: No scleral icterus.       Right eye: No discharge.        Left eye: No discharge.     Conjunctiva/sclera: Conjunctivae normal.     Pupils: Pupils are equal, round, and reactive to light.  Pulmonary:     Effort: Pulmonary effort is normal. No respiratory distress.     Comments: Speaking in full sentences Musculoskeletal:        General: Normal range of motion.     Cervical back: Normal range of motion.     Comments: Ganglion cysts bilateral wrists  Skin:    Coloration: Skin is not jaundiced or pale.     Findings: No bruising, erythema, lesion or rash.  Neurological:     Mental Status: She is alert and oriented to person, place, and time. Mental status is at baseline.  Psychiatric:        Mood and Affect: Mood normal.        Behavior: Behavior normal.        Thought Content: Thought content normal.        Judgment: Judgment normal.     Results for orders placed or performed in visit on 04/15/19  CBC with Differential/Platelet  Result Value Ref Range   WBC 6.7 3.4 - 10.8 x10E3/uL   RBC 3.85 3.77 - 5.28  x10E6/uL   Hemoglobin 9.9 (L) 11.1 - 15.9 g/dL   Hematocrit 30.8 (L) 34.0 - 46.6 %   MCV 80 79 - 97 fL   MCH 25.7 (L) 26.6 - 33.0 pg   MCHC 32.1 31.5 - 35.7 g/dL   RDW 15.3 11.7 - 15.4 %   Platelets 238 150 - 450 x10E3/uL   Neutrophils 63 Not Estab. %   Lymphs 30 Not Estab. %   Monocytes 5 Not Estab. %   Eos 2 Not Estab. %   Basos 0 Not Estab. %   Neutrophils Absolute 4.2 1.4 - 7.0 x10E3/uL   Lymphocytes Absolute 2.0 0.7 - 3.1 x10E3/uL   Monocytes Absolute 0.3 0.1 - 0.9 x10E3/uL   EOS (ABSOLUTE) 0.1 0.0 - 0.4 x10E3/uL   Basophils Absolute 0.0 0.0 - 0.2 x10E3/uL   Immature Granulocytes 0 Not Estab. %   Immature Grans (Abs) 0.0 0.0 - 0.1 H60V3/XT  Basic metabolic panel  Result Value Ref Range   Glucose 90 65 - 99 mg/dL   BUN 14 6 - 24 mg/dL    Creatinine, Ser 0.98 0.57 - 1.00 mg/dL   GFR calc non Af Amer 72 >59 mL/min/1.73   GFR calc Af Amer 83 >59 mL/min/1.73   BUN/Creatinine Ratio 14 9 - 23   Sodium 142 134 - 144 mmol/L   Potassium 3.7 3.5 - 5.2 mmol/L   Chloride 107 (H) 96 - 106 mmol/L   CO2 24 20 - 29 mmol/L   Calcium 8.7 8.7 - 10.2 mg/dL      Assessment & Plan:   Problem List Items Addressed This Visit    None    Visit Diagnoses    Ganglion cyst of both wrists    -  Primary   Likely causing the irritation of the ulnar nerve- will get her into ortho. Call with any concerns.    Relevant Orders   Ambulatory referral to Orthopedic Surgery   Ulnar tunnel syndrome of left wrist       Will try some stretches and get her into ortho. Call with any concerns.        Follow up plan: Return if symptoms worsen or fail to improve.   . This visit was completed via FaceTime due to the restrictions of the COVID-19 pandemic. All issues as above were discussed and addressed. Physical exam was done as above through visual confirmation on FaceTime. If it was felt that the patient should be evaluated in the office, they were directed there. The patient verbally consented to this visit. . Location of the patient: parking lot . Location of the provider: work . Those involved with this call:  . Provider: Park Liter, DO . CMA: Tiffany Reel, CMA . Front Desk/Registration: Don Perking  . Time spent on call: 15 minutes with patient face to face via video conference. More than 50% of this time was spent in counseling and coordination of care. 23 minutes total spent in review of patient's record and preparation of their chart.

## 2019-07-01 ENCOUNTER — Telehealth: Payer: Self-pay | Admitting: Internal Medicine

## 2019-07-01 NOTE — Telephone Encounter (Signed)
Left voice message for patient to call and schedule an appt to recheck labs and restart treatment if needed.

## 2019-07-01 NOTE — Telephone Encounter (Signed)
T- please speak to pt and tell her hemoglobin was low  Around 9 with her PCP in September. And we would like to recheck the labs; and the offer her IV iron again if low.   If she is interested- in next 1-2 weeks- Please order-labs- cbc/iron studies/ferritin; MD video visit-1-2 days later.

## 2019-07-06 ENCOUNTER — Encounter: Payer: Self-pay | Admitting: Family Medicine

## 2019-07-13 ENCOUNTER — Ambulatory Visit (INDEPENDENT_AMBULATORY_CARE_PROVIDER_SITE_OTHER): Payer: PRIVATE HEALTH INSURANCE | Admitting: Family Medicine

## 2019-07-13 ENCOUNTER — Other Ambulatory Visit: Payer: Self-pay

## 2019-07-13 ENCOUNTER — Encounter: Payer: Self-pay | Admitting: Family Medicine

## 2019-07-13 VITALS — BP 150/107 | HR 91

## 2019-07-13 DIAGNOSIS — N898 Other specified noninflammatory disorders of vagina: Secondary | ICD-10-CM | POA: Diagnosis not present

## 2019-07-13 MED ORDER — CLINDAMYCIN HCL 150 MG PO CAPS: 150.0000 mg | ORAL_CAPSULE | Freq: Three times a day (TID) | ORAL | 0 refills | Status: DC

## 2019-07-13 MED ORDER — FLUCONAZOLE 150 MG PO TABS: 150.0000 mg | ORAL_TABLET | Freq: Once | ORAL | 0 refills | Status: AC

## 2019-07-13 NOTE — Progress Notes (Signed)
BP (!) 150/107   Pulse 91    Subjective:    Patient ID: Rachel Salas, female    DOB: 16-Mar-1976, 43 y.o.   MRN: [id number]  HPI: Rachel Salas is a 43 y.o. female  Chief Complaint  Patient presents with  . Vaginal Discharge    Has recurrent BV   VAGINAL DISCHARGE Duration: about a week Discharge description: thin, clear, white  Pruritus: no Dysuria: no Malodorous: no Urinary frequency: no Fevers: no Abdominal pain: no  History of sexually transmitted diseases: no Recent antibiotic use: yes Context: recurrent BV  Treatments attempted: antifungal  Relevant past medical, surgical, family and social history reviewed and updated as indicated. Interim medical history since our last visit reviewed. Allergies and medications reviewed and updated.  Review of Systems  Constitutional: Negative.   Respiratory: Negative.   Cardiovascular: Negative.   Genitourinary: Positive for vaginal discharge. Negative for decreased urine volume, difficulty urinating, dyspareunia, dysuria, enuresis, flank pain, frequency, genital sores, hematuria, menstrual problem, pelvic pain, urgency, vaginal bleeding and vaginal pain.  Musculoskeletal: Negative.   Psychiatric/Behavioral: Negative.     Per HPI unless specifically indicated above     Objective:    BP (!) 150/107   Pulse 91   Wt Readings from Last 3 Encounters:  04/15/19 229 lb (103.9 kg)  03/17/19 226 lb (102.5 kg)  02/28/19 227 lb (103 kg)    Physical Exam Vitals and nursing note reviewed.  Constitutional:      General: She is not in acute distress.    Appearance: Normal appearance. She is not ill-appearing, toxic-appearing or diaphoretic.  HENT:     Head: Normocephalic and atraumatic.     Right Ear: External ear normal.     Left Ear: External ear normal.     Nose: Nose normal.     Mouth/Throat:     Mouth: Mucous membranes are moist.     Pharynx: Oropharynx is clear.  Eyes:     General: No scleral icterus.       Right  eye: No discharge.        Left eye: No discharge.     Conjunctiva/sclera: Conjunctivae normal.     Pupils: Pupils are equal, round, and reactive to light.  Pulmonary:     Effort: Pulmonary effort is normal. No respiratory distress.     Comments: Speaking in full sentences Musculoskeletal:        General: Normal range of motion.     Cervical back: Normal range of motion.  Skin:    Coloration: Skin is not jaundiced or pale.     Findings: No bruising, erythema, lesion or rash.  Neurological:     Mental Status: She is alert and oriented to person, place, and time. Mental status is at baseline.  Psychiatric:        Mood and Affect: Mood normal.        Behavior: Behavior normal.        Thought Content: Thought content normal.        Judgment: Judgment normal.     Results for orders placed or performed in visit on 04/15/19  CBC with Differential/Platelet  Result Value Ref Range   WBC 6.7 3.4 - 10.8 x10E3/uL   RBC 3.85 3.77 - 5.28 x10E6/uL   Hemoglobin 9.9 (L) 11.1 - 15.9 g/dL   Hematocrit 30.8 (L) 34.0 - 46.6 %   MCV 80 79 - 97 fL   MCH 25.7 (L) 26.6 - 33.0 pg   MCHC  32.1 31.5 - 35.7 g/dL   RDW 01.6 01.0 - 93.2 %   Platelets 238 150 - 450 x10E3/uL   Neutrophils 63 Not Estab. %   Lymphs 30 Not Estab. %   Monocytes 5 Not Estab. %   Eos 2 Not Estab. %   Basos 0 Not Estab. %   Neutrophils Absolute 4.2 1.4 - 7.0 x10E3/uL   Lymphocytes Absolute 2.0 0.7 - 3.1 x10E3/uL   Monocytes Absolute 0.3 0.1 - 0.9 x10E3/uL   EOS (ABSOLUTE) 0.1 0.0 - 0.4 x10E3/uL   Basophils Absolute 0.0 0.0 - 0.2 x10E3/uL   Immature Granulocytes 0 Not Estab. %   Immature Grans (Abs) 0.0 0.0 - 0.1 x10E3/uL  Basic metabolic panel  Result Value Ref Range   Glucose 90 65 - 99 mg/dL   BUN 14 6 - 24 mg/dL   Creatinine, Ser 3.55 0.57 - 1.00 mg/dL   GFR calc non Af Amer 72 >59 mL/min/1.73   GFR calc Af Amer 83 >59 mL/min/1.73   BUN/Creatinine Ratio 14 9 - 23   Sodium 142 134 - 144 mmol/L   Potassium 3.7 3.5 -  5.2 mmol/L   Chloride 107 (H) 96 - 106 mmol/L   CO2 24 20 - 29 mmol/L   Calcium 8.7 8.7 - 10.2 mg/dL      Assessment & Plan:   Problem List Items Addressed This Visit    None    Visit Diagnoses    Vaginal discharge    -  Primary   Likely BV- will treat (diflucan in case the clinda gives her yeast). If not better by Monday, will come in for swab. Call with any concerns.        Follow up plan: Return if symptoms worsen or fail to improve.    . This visit was completed via Doximity due to the restrictions of the COVID-19 pandemic. All issues as above were discussed and addressed. Physical exam was done as above through visual confirmation on Doximity. If it was felt that the patient should be evaluated in the office, they were directed there. The patient verbally consented to this visit. . Location of the patient: home . Location of the provider: home . Those involved with this call:  . Provider: Olevia Perches, DO . CMA: Tiffany Reel, CMA . Front Desk/Registration: Adela Ports  . Time spent on call: 15 minutes with patient face to face via video conference. More than 50% of this time was spent in counseling and coordination of care. 23 minutes total spent in review of patient's record and preparation of their chart.

## 2019-08-26 ENCOUNTER — Encounter: Payer: Self-pay | Admitting: Family Medicine

## 2019-08-26 ENCOUNTER — Other Ambulatory Visit: Payer: Self-pay

## 2019-08-26 ENCOUNTER — Ambulatory Visit (INDEPENDENT_AMBULATORY_CARE_PROVIDER_SITE_OTHER): Payer: PRIVATE HEALTH INSURANCE | Admitting: Family Medicine

## 2019-08-26 VITALS — BP 124/84 | HR 94 | Temp 98.3°F

## 2019-08-26 DIAGNOSIS — R1909 Other intra-abdominal and pelvic swelling, mass and lump: Secondary | ICD-10-CM | POA: Diagnosis not present

## 2019-08-26 DIAGNOSIS — N898 Other specified noninflammatory disorders of vagina: Secondary | ICD-10-CM

## 2019-08-26 DIAGNOSIS — R1030 Lower abdominal pain, unspecified: Secondary | ICD-10-CM | POA: Diagnosis not present

## 2019-08-26 LAB — WET PREP FOR TRICH, YEAST, CLUE
Clue Cell Exam: NEGATIVE
Trichomonas Exam: NEGATIVE
Yeast Exam: NEGATIVE

## 2019-08-26 NOTE — Progress Notes (Signed)
BP 124/84 (BP Location: Left Arm, Patient Position: Sitting, Cuff Size: Normal)   Pulse 94   Temp 98.3 F (36.8 C) (Oral)   SpO2 100%    Subjective:    Patient ID: Rachel Salas, female    DOB: [birth year], 44 y.o.   MRN: [id number]  HPI: Rachel Salas is a 44 y.o. female  Chief Complaint  Patient presents with  . Groin Pain  . Other    Possible BV   LUMP Duration: couple of months Location: left groin Painful: no Discomfort: no Bulge: no Quality:  lump Onset: gradual Context: not changing Aggravating factors: nothing  VAGINAL DISCHARGE Duration: weeks Discharge description: clear  Pruritus: no Dysuria: no Malodorous: no Urinary frequency: no Fevers: no Abdominal pain: no  Sexual activity: practicing safe sex History of sexually transmitted diseases: no Recent antibiotic use: yes Context: recurrent BV  Treatments attempted: none  Relevant past medical, surgical, family and social history reviewed and updated as indicated. Interim medical history since our last visit reviewed. Allergies and medications reviewed and updated.  Review of Systems  Constitutional: Negative.   Cardiovascular: Negative.   Musculoskeletal: Positive for myalgias. Negative for arthralgias, back pain, gait problem, joint swelling, neck pain and neck stiffness.  Skin: Negative.   Psychiatric/Behavioral: Negative for agitation, behavioral problems, confusion, decreased concentration, dysphoric mood, hallucinations, self-injury, sleep disturbance and suicidal ideas. 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 The patient is not hyperactive.     Per HPI unless specifically indicated above     Objective:    BP 124/84 (BP Location: Left Arm, Patient Position: Sitting, Cuff Size: Normal)   Pulse 94   Temp 98.3 F (36.8 C) (Oral)   SpO2 100%   Wt Readings from Last 3 Encounters:  04/15/19 229 lb (103.9 kg)  03/17/19  226 lb (102.5 kg)  02/28/19 227 lb (103 kg)    Physical Exam Vitals and nursing note reviewed.  Constitutional:      General: She is not in acute distress.    Appearance: Normal appearance. She is not ill-appearing, toxic-appearing or diaphoretic.  HENT:     Head: Normocephalic and atraumatic.     Right Ear: External ear normal.     Left Ear: External ear normal.     Nose: Nose normal.     Mouth/Throat:     Mouth: Mucous membranes are moist.     Pharynx: Oropharynx is clear.  Eyes:     General: No scleral icterus.       Right eye: No discharge.        Left eye: No discharge.     Extraocular Movements: Extraocular movements intact.     Conjunctiva/sclera: Conjunctivae normal.     Pupils: Pupils are equal, round, and reactive to light.  Cardiovascular:     Rate and Rhythm: Normal rate and regular rhythm.     Pulses: Normal pulses.     Heart sounds: Normal heart sounds. No murmur. No friction rub. No gallop.   Pulmonary:     Effort: Pulmonary effort is normal. No respiratory distress.     Breath sounds: Normal breath sounds. No stridor. No wheezing, rhonchi or rales.  Chest:     Chest wall: No tenderness.  Musculoskeletal:        General: Normal range of motion.     Cervical back: Normal range of motion and neck supple.     Comments: Subcutaneous lump in L groin  Skin:    General: Skin is warm and dry.     Capillary Refill: Capillary refill takes less than 2 seconds.     Coloration: Skin is not jaundiced or pale.     Findings: No bruising, erythema, lesion or rash.  Neurological:     General: No focal deficit present.     Mental Status: She is alert and oriented to person, place, and time. Mental status is at baseline.  Psychiatric:        Mood and Affect: Mood normal.        Behavior: Behavior normal.        Thought Content: Thought content normal.        Judgment: Judgment normal.     Results for orders placed or performed in visit on 04/15/19  CBC with  Differential/Platelet  Result Value Ref Range   WBC 6.7 3.4 - 10.8 x10E3/uL   RBC 3.85 3.77 - 5.28 x10E6/uL   Hemoglobin 9.9 (L) 11.1 - 15.9 g/dL   Hematocrit 42.3 (L) 53.6 - 46.6 %   MCV 80 79 - 97 fL   MCH 25.7 (L) 26.6 - 33.0 pg   MCHC 32.1 31.5 - 35.7 g/dL   RDW 14.4 31.5 - 40.0 %   Platelets 238 150 - 450 x10E3/uL   Neutrophils 63 Not Estab. %   Lymphs 30 Not Estab. %   Monocytes 5 Not Estab. %   Eos 2 Not Estab. %   Basos 0 Not Estab. %   Neutrophils Absolute 4.2 1.4 - 7.0 x10E3/uL   Lymphocytes Absolute 2.0 0.7 - 3.1 x10E3/uL   Monocytes Absolute 0.3 0.1 - 0.9 x10E3/uL   EOS (ABSOLUTE) 0.1 0.0 - 0.4 x10E3/uL   Basophils Absolute 0.0 0.0 - 0.2 x10E3/uL   Immature Granulocytes 0 Not Estab. %   Immature Grans (Abs) 0.0 0.0 - 0.1 x10E3/uL  Basic metabolic panel  Result Value Ref Range   Glucose 90 65 - 99 mg/dL   BUN 14 6 - 24 mg/dL   Creatinine, Ser 8.67 0.57 - 1.00 mg/dL   GFR calc non Af Amer 72 >59 mL/min/1.73   GFR calc Af Amer 83 >59 mL/min/1.73   BUN/Creatinine Ratio 14 9 - 23   Sodium 142 134 - 144 mmol/L   Potassium 3.7 3.5 - 5.2 mmol/L   Chloride 107 (H) 96 - 106 mmol/L   CO2 24 20 - 29 mmol/L   Calcium 8.7 8.7 - 10.2 mg/dL      Assessment & Plan:   Problem List Items Addressed This Visit    None    Visit Diagnoses    Lump in the groin    -  Primary   Concern for hernia vs subcutaneous tissue- will obtain US. Await results. Call with any concerns.    Relevant Orders   US Abdomen Limited   Vaginal discharge       Normal wet prep. Reassured patient.    Relevant Orders   WET PREP FOR TRICH, YEAST, CLUE   Inguinal pain, unspecified laterality       Concern for hernia vs subcutaneous tissue- will obtain US. Await results. Call with any concerns.    Relevant Orders   UA/M w/rflx Culture, Routine       Follow up plan: Return if symptoms worsen or fail to improve.

## 2019-08-28 LAB — UA/M W/RFLX CULTURE, ROUTINE
Bilirubin, UA: NEGATIVE
Glucose, UA: NEGATIVE
Ketones, UA: NEGATIVE
Nitrite, UA: NEGATIVE
Specific Gravity, UA: 1.02 (ref 1.005–1.030)
Urobilinogen, Ur: 0.2 mg/dL (ref 0.2–1.0)
pH, UA: 6 (ref 5.0–7.5)

## 2019-08-28 LAB — MICROSCOPIC EXAMINATION

## 2019-08-28 LAB — URINE CULTURE, REFLEX

## 2019-09-02 ENCOUNTER — Ambulatory Visit: Admission: RE | Admit: 2019-09-02 | Payer: PRIVATE HEALTH INSURANCE | Source: Ambulatory Visit

## 2019-09-09 ENCOUNTER — Encounter: Payer: PRIVATE HEALTH INSURANCE | Admitting: Family Medicine

## 2019-09-29 ENCOUNTER — Encounter: Payer: Self-pay | Admitting: Family Medicine

## 2019-10-17 ENCOUNTER — Ambulatory Visit (INDEPENDENT_AMBULATORY_CARE_PROVIDER_SITE_OTHER): Payer: PRIVATE HEALTH INSURANCE | Admitting: Family Medicine

## 2019-10-17 ENCOUNTER — Encounter: Payer: Self-pay | Admitting: Family Medicine

## 2019-10-17 VITALS — BP 138/88 | HR 79 | Temp 98.8°F | Ht 66.61 in | Wt 245.1 lb

## 2019-10-17 DIAGNOSIS — I10 Essential (primary) hypertension: Secondary | ICD-10-CM

## 2019-10-17 DIAGNOSIS — F331 Major depressive disorder, recurrent, moderate: Secondary | ICD-10-CM | POA: Insufficient documentation

## 2019-10-17 DIAGNOSIS — F321 Major depressive disorder, single episode, moderate: Secondary | ICD-10-CM | POA: Diagnosis not present

## 2019-10-17 DIAGNOSIS — Z0000 Encounter for general adult medical examination without abnormal findings: Secondary | ICD-10-CM

## 2019-10-17 DIAGNOSIS — D500 Iron deficiency anemia secondary to blood loss (chronic): Secondary | ICD-10-CM | POA: Diagnosis not present

## 2019-10-17 DIAGNOSIS — Z1231 Encounter for screening mammogram for malignant neoplasm of breast: Secondary | ICD-10-CM

## 2019-10-17 DIAGNOSIS — B001 Herpesviral vesicular dermatitis: Secondary | ICD-10-CM

## 2019-10-17 MED ORDER — BUPROPION HCL ER (SR) 150 MG PO TB12: ORAL_TABLET | ORAL | 3 refills | Status: DC

## 2019-10-17 MED ORDER — VALACYCLOVIR HCL 1 G PO TABS: 2000.0000 mg | ORAL_TABLET | Freq: Two times a day (BID) | ORAL | 12 refills | Status: DC

## 2019-10-17 NOTE — Progress Notes (Signed)
BP 138/88 (BP Location: Left Arm, Cuff Size: Normal)   Pulse 79   Temp 98.8 F (37.1 C)   Ht 5' 6.61" (1.692 m)   Wt 245 lb 2 oz (111.2 kg)   SpO2 99%   BMI 38.84 kg/m    Subjective:    Patient ID: Rachel Salas, female    DOB: 1975/11/12, 44 y.o.   MRN: [id number]  HPI: Rachel Salas is a 44 y.o. female presenting on 10/17/2019 for comprehensive medical examination. Current medical complaints include:  ANXIETY/STRESS- significantly worse since the end of January Duration: about 3 months Status:exacerbated Anxious mood: yes  Excessive worrying: yes Irritability: yes  Sweating: no Nausea: no Palpitations:yes Hyperventilation: no Panic attacks: no Agoraphobia: no  Obscessions/compulsions: no Depressed mood: yes Depression screen [facility] 2/9 07/13/2019 11/20/2017 11/17/2016 04/07/2016  Decreased Interest 0 0 0 0  Down, Depressed, Hopeless 0 0 0 0  PHQ - 2 Score 0 0 0 0  Altered sleeping 0 0 - -  Tired, decreased energy 3 1 - -  Change in appetite 0 0 - -  Feeling bad or failure about yourself  0 0 - -  Trouble concentrating 0 0 - -  Moving slowly or fidgety/restless 0 0 - -  Suicidal thoughts 0 0 - -  PHQ-9 Score 3 1 - -  Difficult doing work/chores Not difficult at all Not difficult at all - -  No flowsheet data found.  Anhedonia: no Weight changes: yes Insomnia: yes hard to fall asleep  Hypersomnia: yes Fatigue/loss of energy: yes Feelings of worthlessness: no Feelings of guilt: no Impaired concentration/indecisiveness: no Suicidal ideations: no  Crying spells: yes Recent Stressors/Life Changes: yes   Relationship problems: no   Family stress: yes     Financial stress: yes    Job stress: yes    Recent death/loss: no  HYPERTENSION- stopped taking her metoprolol Hypertension status: stable  Satisfied with current treatment? yes Duration of hypertension: chronic BP monitoring frequency:  a few times a week BP range: 120s/80s BP medication side effects:   no Medication compliance: poor compliance Previous BP meds: metoprolol Aspirin: no Recurrent headaches: no Visual changes: no Palpitations: no Dyspnea: no Chest pain: no Lower extremity edema: no Dizzy/lightheaded: no  ANEMIA Anemia status: uncontrolled Etiology of anemia: iron def Duration of anemia treatment: years  Compliance with treatment: good compliance Iron supplementation side effects: no Severity of anemia: moderate Fatigue: yes Decreased exercise tolerance: yes  Dyspnea on exertion: no Palpitations: yes Bleeding: no Pica: no  Menopausal Symptoms: no  Depression Screen done today and results listed below:  Depression screen [facility] 2/9 07/13/2019 11/20/2017 11/17/2016 04/07/2016  Decreased Interest 0 0 0 0  Down, Depressed, Hopeless 0 0 0 0  PHQ - 2 Score 0 0 0 0  Altered sleeping 0 0 - -  Tired, decreased energy 3 1 - -  Change in appetite 0 0 - -  Feeling bad or failure about yourself  0 0 - -  Trouble concentrating 0 0 - -  Moving slowly or fidgety/restless 0 0 - -  Suicidal thoughts 0 0 - -  PHQ-9 Score 3 1 - -  Difficult doing work/chores Not difficult at all Not difficult at all - -    Past Medical History:  Past Medical History:  Diagnosis Date  . Adenomyosis 11/15/2014   u/s suspicious for adenomyosis  . Essential hypertension, benign 10/14/2012  . GERD (gastroesophageal reflux disease)   . Hypertension   .  Hypokalemia   . IDA (iron deficiency anemia)   . Menorrhagia with irregular cycle   . Pseudotumor cerebri dx 2007  . Severe obesity ([place])     Surgical History:  Past Surgical History:  Procedure Laterality Date  . TONSILLECTOMY  2007  . TONSILLECTOMY  2007  . TUBAL LIGATION  2002    Medications:  Current Outpatient Medications on File Prior to Visit  Medication Sig  . cyclobenzaprine (FLEXERIL) 10 MG tablet Take 10 mg by mouth at bedtime. PRN  . Probiotic Product (PROBIOTIC/PREBIOTIC/CRANBERRY) CAPS Take by mouth daily.   No current  facility-administered medications on file prior to visit.    Allergies:  Allergies  Allergen Reactions  . Tylenol [Acetaminophen] Hives  . Other Palpitations    FEREHEME- CHEST HEAVINESS AND ARM PAIN    Social History:  Social History   Socioeconomic History  . Marital status: Married    Spouse name: Not on file  . Number of children: 3  . Years of education: BS  . Highest education level: Not on file  Occupational History    Employer: [employer]  Tobacco Use  . Smoking status: Never Smoker  . Smokeless tobacco: Never Used  Substance and Sexual Activity  . Alcohol use: Yes    Alcohol/week: 0.0 standard drinks    Comment: Rare/Occasional  . Drug use: No  . Sexual activity: Yes    Partners: Male    Birth control/protection: Surgical, None    Comment: BTL  Other Topics Concern  . 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 No smoking; occasional alcohol.    Social Determinants of Health   Financial Resource Strain:   . Difficulty of Paying Living Expenses:   Food Insecurity:   . Worried About Charity fundraiser in the Last Year:   . Arboriculturist in the Last Year:   Transportation Needs:   . Film/video editor (Medical):   Marland Kitchen Lack of Transportation (Non-Medical):   Physical Activity:   . Days of Exercise per Week:   . Minutes of Exercise per Session:   Stress:   . Feeling of Stress :   Social Connections:   . Frequency of Communication with Friends and Family:   . Frequency of Social Gatherings with Friends and Family:   . Attends Religious Services:   . Active Member of Clubs or Organizations:   . Attends Archivist Meetings:   Marland Kitchen Marital Status:   Intimate Partner Violence:   . Fear of Current or Ex-Partner:   . Emotionally Abused:   Marland Kitchen Physically Abused:   . Sexually Abused:    Social History   Tobacco Use  Smoking Status Never Smoker  Smokeless  Tobacco Never Used   Social History   Substance and Sexual Activity  Alcohol Use Yes  . Alcohol/week: 0.0 standard drinks   Comment: Rare/Occasional    Family History:  Family History  Problem Relation Age of Onset  . Hypertension Mother   . Hypercholesterolemia Mother   . Thyroid disease Mother   . Stroke Maternal Grandmother   . Colon cancer Neg Hx   . Breast cancer Neg Hx   . Ovarian cancer Neg Hx   . Heart disease Neg Hx     Past medical history, surgical history, medications, allergies, family history and social history reviewed with patient today and changes  made to appropriate areas of the chart.   Review of Systems  Constitutional: Negative.   HENT: Negative.   Eyes: Negative.   Respiratory: Negative.   Cardiovascular: Positive for palpitations. Negative for chest pain, orthopnea, claudication, leg swelling and PND.  Gastrointestinal: Positive for constipation. Negative for abdominal pain, blood in stool, diarrhea, heartburn, melena, nausea and vomiting.  Genitourinary: Negative.   Musculoskeletal: Negative.   Skin: Negative.   Neurological: Negative.   Endo/Heme/Allergies: Negative.   Psychiatric/Behavioral: Positive for depression. Negative for hallucinations, memory loss, substance abuse and suicidal ideas. The patient is nervous/anxious. The patient does not have insomnia.     All other ROS negative except what is listed above and in the HPI.      Objective:    BP 138/88 (BP Location: Left Arm, Cuff Size: Normal)   Pulse 79   Temp 98.8 F (37.1 C)   Ht 5' 6.61" (1.692 m)   Wt 245 lb 2 oz (111.2 kg)   SpO2 99%   BMI 38.84 kg/m   Wt Readings from Last 3 Encounters:  10/17/19 245 lb 2 oz (111.2 kg)  04/15/19 229 lb (103.9 kg)  03/17/19 226 lb (102.5 kg)    Physical Exam Vitals and nursing note reviewed.  Constitutional:      General: She is not in acute distress.    Appearance: Normal appearance. She is not ill-appearing, toxic-appearing or  diaphoretic.  HENT:     Head: Normocephalic and atraumatic.     Right Ear: Tympanic membrane, ear canal and external ear normal. There is no impacted cerumen.     Left Ear: Tympanic membrane, ear canal and external ear normal. There is no impacted cerumen.     Nose: Nose normal. No congestion or rhinorrhea.     Mouth/Throat:     Mouth: Mucous membranes are moist.     Pharynx: Oropharynx is clear. No oropharyngeal exudate or posterior oropharyngeal erythema.  Eyes:     General: No scleral icterus.       Right eye: No discharge.        Left eye: No discharge.     Extraocular Movements: Extraocular movements intact.     Conjunctiva/sclera: Conjunctivae normal.     Pupils: Pupils are equal, round, and reactive to light.  Neck:     Vascular: No carotid bruit.  Cardiovascular:     Rate and Rhythm: Normal rate and regular rhythm.     Pulses: Normal pulses.     Heart sounds: No murmur. No friction rub. No gallop.   Pulmonary:     Effort: Pulmonary effort is normal. No respiratory distress.     Breath sounds: Normal breath sounds. No stridor. No wheezing, rhonchi or rales.  Chest:     Chest wall: No tenderness.  Abdominal:     General: Abdomen is flat. Bowel sounds are normal. There is no distension.     Palpations: Abdomen is soft. There is no mass.     Tenderness: There is no abdominal tenderness. There is no right CVA tenderness, left CVA tenderness, guarding or rebound.     Hernia: No hernia is present.  Genitourinary:    Comments: Breast and pelvic exams deferred with shared decision making Musculoskeletal:        General: No swelling, tenderness, deformity or signs of injury.     Cervical back: Normal range of motion and neck supple. No rigidity. No muscular tenderness.     Right lower leg: No edema.  Left lower leg: No edema.  Lymphadenopathy:     Cervical: No cervical adenopathy.  Skin:    General: Skin is warm and dry.     Capillary Refill: Capillary refill takes less  than 2 seconds.     Coloration: Skin is not jaundiced or pale.     Findings: No bruising, erythema, lesion or rash.  Neurological:     General: No focal deficit present.     Mental Status: She is alert and oriented to person, place, and time. Mental status is at baseline.     Cranial Nerves: No cranial nerve deficit.     Sensory: No sensory deficit.     Motor: No weakness.     Coordination: Coordination normal.     Gait: Gait normal.     Deep Tendon Reflexes: Reflexes normal.  Psychiatric:        Mood and Affect: Mood normal.        Behavior: Behavior normal.        Thought Content: Thought content normal.        Judgment: Judgment normal.     Results for orders placed or performed in visit on 08/26/19  WET PREP FOR TRICH, YEAST, CLUE   Specimen: Vaginal Fluid   VAGINAL FLUI  Result Value Ref Range   Trichomonas Exam Negative Negative   Yeast Exam Negative Negative   Clue Cell Exam Negative Negative  Microscopic Examination   VAGINAL FLUI  Result Value Ref Range   WBC, UA 6-10 (A) 0 - 5 /hpf   RBC 3-10 (A) 0 - 2 /hpf   Epithelial Cells (non renal) 0-10 0 - 10 /hpf   Bacteria, UA Moderate (A) None seen/Few  Urine Culture, Reflex   VAGINAL FLUI  Result Value Ref Range   Urine Culture, Routine Final report    Organism ID, Bacteria Comment   UA/M w/rflx Culture, Routine   Specimen: Vaginal Fluid   VAGINAL FLUI  Result Value Ref Range   Specific Gravity, UA 1.020 1.005 - 1.030   pH, UA 6.0 5.0 - 7.5   Color, UA Yellow Yellow   Appearance Ur Clear Clear   Leukocytes,UA 1+ (A) Negative   Protein,UA Trace Negative/Trace   Glucose, UA Negative Negative   Ketones, UA Negative Negative   RBC, UA Trace (A) Negative   Bilirubin, UA Negative Negative   Urobilinogen, Ur 0.2 0.2 - 1.0 mg/dL   Nitrite, UA Negative Negative   Microscopic Examination See below:    Urinalysis Reflex Comment       Assessment & Plan:   Problem List Items Addressed This Visit       Cardiovascular and Mediastinum   HTN (hypertension)    Stopped her metoprolol- running borderline. Will work on [employer] and treat anxiety. Call with any concerns. Recheck 1 month.       Relevant Orders   Comprehensive metabolic panel   TSH   Microalbumin, Urine Waived     Digestive   Recurrent cold sores    Under good control on current regimen. Continue current regimen. Continue to monitor. Call with any concerns. Refills given.        Relevant Medications   valACYclovir (VALTREX) 1000 MG tablet     Other   Iron deficiency anemia due to chronic blood loss    Rechecking levels today. Await results. Call with any concerns.       Relevant Orders   CBC with Differential/Platelet   Iron and TIBC   Ferritin  Depression, major, single episode, moderate (HCC)    New. Worsened after stopping contrave. Start wellbutrin. Recheck 1 month. Call with any concerns.       Relevant Medications   buPROPion (WELLBUTRIN SR) 150 MG 12 hr tablet    Other Visit Diagnoses    Routine general medical examination at a health care facility    -  Primary   Vaccines up to date. Screening labs checked today. Pap up to date. Mammogram ordered. Continue diet and exercise. Call with any concerns.    Relevant Orders   CBC with Differential/Platelet   Comprehensive metabolic panel   Lipid Panel w/o Chol/HDL Ratio   TSH   UA/M w/rflx Culture, Routine   Microalbumin, Urine Waived   Iron and TIBC   Ferritin   Encounter for screening mammogram for malignant neoplasm of breast       Mammogram ordered.    Relevant Orders   MM 3D SCREEN BREAST BILATERAL       Follow up plan: Return in about 4 weeks (around 11/14/2019).   LABORATORY TESTING:  - Pap smear: up to date  IMMUNIZATIONS:   - Tdap: Tetanus vaccination status reviewed: last tetanus booster within 10 years. - Influenza: Up to date - Pneumovax: Not applicable  SCREENING: -Mammogram: Ordered today   PATIENT COUNSELING:   Advised to  take 1 mg of folate supplement per day if capable of pregnancy.   Sexuality: Discussed sexually transmitted diseases, partner selection, use of condoms, avoidance of unintended pregnancy  and contraceptive alternatives.   Advised to avoid cigarette smoking.  I discussed with the patient that most people either abstain from alcohol or drink within safe limits (<=14/week and <=4 drinks/occasion for males, <=7/weeks and <= 3 drinks/occasion for females) and that the risk for alcohol disorders and other health effects rises proportionally with the number of drinks per week and how often a drinker exceeds daily limits.  Discussed cessation/primary prevention of drug use and availability of treatment for abuse.   Diet: Encouraged to adjust caloric intake to maintain  or achieve ideal body weight, to reduce intake of dietary saturated fat and total fat, to limit sodium intake by avoiding high sodium foods and not adding table salt, and to maintain adequate dietary potassium and calcium preferably from fresh fruits, vegetables, and low-fat dairy products.    stressed the importance of regular exercise  Injury prevention: Discussed safety belts, safety helmets, smoke detector, smoking near bedding or upholstery.   Dental health: Discussed importance of regular tooth brushing, flossing, and dental visits.    NEXT PREVENTATIVE PHYSICAL DUE IN 1 YEAR. Return in about 4 weeks (around 11/14/2019).

## 2019-10-17 NOTE — Assessment & Plan Note (Signed)
Stopped her metoprolol- running borderline. Will work on [employer] and treat anxiety. Call with any concerns. Recheck 1 month.

## 2019-10-17 NOTE — Assessment & Plan Note (Signed)
New. Worsened after stopping contrave. Start wellbutrin. Recheck 1 month. Call with any concerns.

## 2019-10-17 NOTE — Assessment & Plan Note (Signed)
Under good control on current regimen. Continue current regimen. Continue to monitor. Call with any concerns. Refills given.   

## 2019-10-17 NOTE — Patient Instructions (Addendum)
Call to schedule your mammogram: [facility] at [facility]  Address: [address], [place], [place] [address]  Phone: [phone]   Health Maintenance, Female Adopting a healthy lifestyle and getting preventive care are important in promoting health and wellness. Ask your health care provider about:  The right schedule for you to have regular tests and exams.  Things you can do on your own to prevent diseases and keep yourself healthy. What should I know about diet, weight, and exercise? Eat a healthy diet   Eat a diet that includes plenty of vegetables, fruits, low-fat dairy products, and lean protein.  Do not eat a lot of foods that are high in solid fats, added sugars, or sodium. Maintain a healthy weight Body mass index (BMI) is used to identify weight problems. It estimates body fat based on height and weight. Your health care provider can help determine your BMI and help you achieve or maintain a healthy weight. Get regular exercise Get regular exercise. This is one of the most important things you can do for your health. Most adults should:  Exercise for at least 150 minutes each week. The exercise should increase your heart rate and make you sweat (moderate-intensity exercise).  Do strengthening exercises at least twice a week. This is in addition to the moderate-intensity exercise.  Spend less time sitting. Even light physical activity can be beneficial. Watch cholesterol and blood lipids Have your blood tested for lipids and cholesterol at 42 years of age, then have this test every 5 years. Have your cholesterol levels checked more often if:  Your lipid or cholesterol levels are high.  You are older than 42 years of age.  You are at high risk for heart disease. What should I know about cancer screening? Depending on your health history and family history, you may need to have cancer screening at various ages. This may include screening  for:  Breast cancer.  Cervical cancer.  Colorectal cancer.  Skin cancer.  Lung cancer. What should I know about heart disease, diabetes, and high blood pressure? Blood pressure and heart disease  High blood pressure causes heart disease and increases the risk of stroke. This is more likely to develop in people who have high blood pressure readings, are of African descent, or are overweight.  Have your blood pressure checked: ? Every 3-5 years if you are 18-39 years of age. ? Every year if you are 40 years old or older. Diabetes Have regular diabetes screenings. This checks your fasting blood sugar level. Have the screening done:  Once every three years after age 40 if you are at a normal weight and have a low risk for diabetes.  More often and at a younger age if you are overweight or have a high risk for diabetes. What should I know about preventing infection? Hepatitis B If you have a higher risk for hepatitis B, you should be screened for this virus. Talk with your health care provider to find out if you are at risk for hepatitis B infection. Hepatitis C Testing is recommended for:  Everyone born from 1945 through 1965.  Anyone with known risk factors for hepatitis C. Sexually transmitted infections (STIs)  Get screened for STIs, including gonorrhea and chlamydia, if: ? You are sexually active and are younger than 42 years of age. ? You are older than 42 years of age and your health care provider tells you that you are at risk for this type of infection. ?   Your sexual activity has changed since you were last screened, and you are at increased risk for chlamydia or gonorrhea. Ask your health care provider if you are at risk.  Ask your health care provider about whether you are at high risk for HIV. Your health care provider may recommend a prescription medicine to help prevent HIV infection. If you choose to take medicine to prevent HIV, you should first get tested for HIV.  You should then be tested every 3 months for as long as you are taking the medicine. Pregnancy  If you are about to stop having your period (premenopausal) and you may become pregnant, seek counseling before you get pregnant.  Take 400 to 800 micrograms (mcg) of folic acid every day if you become pregnant.  Ask for birth control (contraception) if you want to prevent pregnancy. Osteoporosis and menopause Osteoporosis is a disease in which the bones lose minerals and strength with aging. This can result in bone fractures. If you are 75 years old or older, or if you are at risk for osteoporosis and fractures, ask your health care provider if you should:  Be screened for bone loss.  Take a calcium or vitamin D supplement to lower your risk of fractures.  Be given hormone replacement therapy (HRT) to treat symptoms of menopause. Follow these instructions at home: Lifestyle  Do not use any products that contain nicotine or tobacco, such as cigarettes, e-cigarettes, and chewing tobacco. If you need help quitting, ask your health care provider.  Do not use street drugs.  Do not share needles.  Ask your health care provider for help if you need support or information about quitting drugs. Alcohol use  Do not drink alcohol if: ? Your health care provider tells you not to drink. ? You are pregnant, may be pregnant, or are planning to become pregnant.  If you drink alcohol: ? Limit how much you use to 0-1 drink a day. ? Limit intake if you are breastfeeding.  Be aware of how much alcohol is in your drink. In the [place] one drink equals one 12 oz bottle of beer (355 mL), one 5 oz glass of wine (148 mL), or one 1 oz glass of hard liquor (44 mL). General instructions  Schedule regular health, dental, and eye exams.  Stay current with your vaccines.  Tell your health care provider if: ? You often feel depressed. ? You have ever been abused or do not feel safe at  home. Summary  Adopting a healthy lifestyle and getting preventive care are important in promoting health and wellness.  Follow your health care provider's instructions about healthy diet, exercising, and getting tested or screened for diseases.  Follow your health care provider's instructions on monitoring your cholesterol and blood pressure. This information is not intended to replace advice given to you by your health care provider. Make sure you discuss any questions you have with your health care provider. Document Revised: 06/30/2018 Document Reviewed: 06/30/2018 Elsevier Patient Education  [place] DASH stands for "Dietary Approaches to Stop Hypertension." The DASH eating plan is a healthy eating plan that has been shown to reduce high blood pressure (hypertension). It may also reduce your risk for type 2 diabetes, heart disease, and stroke. The DASH eating plan may also help with weight loss. What are tips for following this plan?  General guidelines  Avoid eating more than 2,300 mg (milligrams) of salt (sodium) a day. If you have hypertension, you  may need to reduce your sodium intake to 1,500 mg a day.  Limit alcohol intake to no more than 1 drink a day for nonpregnant women and 2 drinks a day for men. One drink equals 12 oz of beer, 5 oz of wine, or 1 oz of hard liquor.  Work with your health care provider to maintain a healthy body weight or to lose weight. Ask what an ideal weight is for you.  Get at least 30 minutes of exercise that causes your heart to beat faster (aerobic exercise) most days of the week. Activities may include walking, swimming, or biking.  Work with your health care provider or diet and nutrition specialist (dietitian) to adjust your eating plan to your individual calorie needs. Reading food labels   Check food labels for the amount of sodium per serving. Choose foods with less than 5 percent of the Daily Value of sodium.  Generally, foods with less than 300 mg of sodium per serving fit into this eating plan.  To find whole grains, look for the word "whole" as the first word in the ingredient list. Shopping  Buy products labeled as "low-sodium" or "no salt added."  Buy fresh foods. Avoid canned foods and premade or frozen meals. Cooking  Avoid adding salt when cooking. Use salt-free seasonings or herbs instead of table salt or sea salt. Check with your health care provider or pharmacist before using salt substitutes.  Do not fry foods. Cook foods using healthy methods such as baking, boiling, grilling, and broiling instead.  Cook with heart-healthy oils, such as olive, canola, soybean, or sunflower oil. Meal planning  Eat a balanced diet that includes: ? 5 or more servings of fruits and vegetables each day. At each meal, try to fill half of your plate with fruits and vegetables. ? Up to 6-8 servings of whole grains each day. ? Less than 6 oz of lean meat, poultry, or fish each day. A 3-oz serving of meat is about the same size as a deck of cards. One egg equals 1 oz. ? 2 servings of low-fat dairy each day. ? A serving of nuts, seeds, or beans 5 times each week. ? Heart-healthy fats. Healthy fats called Omega-3 fatty acids are found in foods such as flaxseeds and coldwater fish, like sardines, salmon, and mackerel.  Limit how much you eat of the following: ? Canned or prepackaged foods. ? Food that is high in trans fat, such as fried foods. ? Food that is high in saturated fat, such as fatty meat. ? Sweets, desserts, sugary drinks, and other foods with added sugar. ? Full-fat dairy products.  Do not salt foods before eating.  Try to eat at least 2 vegetarian meals each week.  Eat more home-cooked food and less restaurant, buffet, and fast food.  When eating at a restaurant, ask that your food be prepared with less salt or no salt, if possible. What foods are recommended? The items listed may not  be a complete list. Talk with your dietitian about what dietary choices are best for you. Grains Whole-grain or whole-wheat bread. Whole-grain or whole-wheat pasta. Brown rice. Orpah Cobb. Bulgur. Whole-grain and low-sodium cereals. Pita bread. Low-fat, low-sodium crackers. Whole-wheat flour tortillas. Vegetables Fresh or frozen vegetables (raw, steamed, roasted, or grilled). Low-sodium or reduced-sodium tomato and vegetable juice. Low-sodium or reduced-sodium tomato sauce and tomato paste. Low-sodium or reduced-sodium canned vegetables. Fruits All fresh, dried, or frozen fruit. Canned fruit in natural juice (without added sugar). Meat and other  protein foods Skinless chicken or [place]. Ground chicken or [place]. Pork with fat trimmed off. Fish and seafood. Egg whites. Dried beans, peas, or lentils. Unsalted nuts, nut butters, and seeds. Unsalted canned beans. Lean cuts of beef with fat trimmed off. Low-sodium, lean deli meat. Dairy Low-fat (1%) or fat-free (skim) milk. Fat-free, low-fat, or reduced-fat cheeses. Nonfat, low-sodium ricotta or cottage cheese. Low-fat or nonfat yogurt. Low-fat, low-sodium cheese. Fats and oils Soft margarine without trans fats. Vegetable oil. Low-fat, reduced-fat, or light mayonnaise and salad dressings (reduced-sodium). Canola, safflower, olive, soybean, and sunflower oils. Avocado. Seasoning and other foods Herbs. Spices. Seasoning mixes without salt. Unsalted popcorn and pretzels. Fat-free sweets. What foods are not recommended? The items listed may not be a complete list. Talk with your dietitian about what dietary choices are best for you. Grains Baked goods made with fat, such as croissants, muffins, or some breads. Dry pasta or rice meal packs. Vegetables Creamed or fried vegetables. Vegetables in a cheese sauce. Regular canned vegetables (not low-sodium or reduced-sodium). Regular canned tomato sauce and paste (not low-sodium or reduced-sodium). Regular  tomato and vegetable juice (not low-sodium or reduced-sodium). Rosita Fire. Olives. Fruits Canned fruit in a light or heavy syrup. Fried fruit. Fruit in cream or butter sauce. Meat and other protein foods Fatty cuts of meat. Ribs. Fried meat. Tomasa Blase. Sausage. Bologna and other processed lunch meats. Salami. Fatback. Hotdogs. Bratwurst. Salted nuts and seeds. Canned beans with added salt. Canned or smoked fish. Whole eggs or egg yolks. Chicken or [place] with skin. Dairy Whole or 2% milk, cream, and half-and-half. Whole or full-fat cream cheese. Whole-fat or sweetened yogurt. Full-fat cheese. Nondairy creamers. Whipped toppings. Processed cheese and cheese spreads. Fats and oils Butter. Stick margarine. Lard. Shortening. Ghee. Bacon fat. Tropical oils, such as coconut, palm kernel, or palm oil. Seasoning and other foods Salted popcorn and pretzels. Onion salt, garlic salt, seasoned salt, table salt, and sea salt. Worcestershire sauce. Tartar sauce. Barbecue sauce. Teriyaki sauce. Soy sauce, including reduced-sodium. Steak sauce. Canned and packaged gravies. Fish sauce. Oyster sauce. Cocktail sauce. Horseradish that you find on the shelf. Ketchup. Mustard. Meat flavorings and tenderizers. Bouillon cubes. Hot sauce and Tabasco sauce. Premade or packaged marinades. Premade or packaged taco seasonings. Relishes. Regular salad dressings. Where to find more information:  National Heart, Lung, and Blood Institute: PopSteam.is  American Heart Association: www.heart.org Summary  The DASH eating plan is a healthy eating plan that has been shown to reduce high blood pressure (hypertension). It may also reduce your risk for type 2 diabetes, heart disease, and stroke.  With the DASH eating plan, you should limit salt (sodium) intake to 2,300 mg a day. If you have hypertension, you may need to reduce your sodium intake to 1,500 mg a day.  When on the DASH eating plan, aim to eat more fresh fruits and  vegetables, whole grains, lean proteins, low-fat dairy, and heart-healthy fats.  Work with your health care provider or diet and nutrition specialist (dietitian) to adjust your eating plan to your individual calorie needs. This information is not intended to replace advice given to you by your health care provider. Make sure you discuss any questions you have with your health care provider. Document Revised: 06/19/2017 Document Reviewed: 06/30/2016 Elsevier Patient Education  2020 [employer].

## 2019-10-17 NOTE — Assessment & Plan Note (Signed)
Rechecking levels today. Await results. Call with any concerns.  

## 2019-10-18 LAB — IRON AND TIBC
Iron Saturation: 5 % — CL (ref 15–55)
Iron: 17 ug/dL — ABNORMAL LOW (ref 27–159)
Total Iron Binding Capacity: 310 ug/dL (ref 250–450)
UIBC: 293 ug/dL (ref 131–425)

## 2019-10-18 LAB — COMPREHENSIVE METABOLIC PANEL
ALT: 5 IU/L (ref 0–32)
AST: 12 IU/L (ref 0–40)
Albumin/Globulin Ratio: 1.3 (ref 1.2–2.2)
Albumin: 4.1 g/dL (ref 3.8–4.8)
Alkaline Phosphatase: 76 IU/L (ref 39–117)
BUN/Creatinine Ratio: 11 (ref 9–23)
BUN: 10 mg/dL (ref 6–24)
Bilirubin Total: 0.2 mg/dL (ref 0.0–1.2)
CO2: 22 mmol/L (ref 20–29)
Calcium: 8.8 mg/dL (ref 8.7–10.2)
Chloride: 106 mmol/L (ref 96–106)
Creatinine, Ser: 0.87 mg/dL (ref 0.57–1.00)
GFR calc Af Amer: 96 mL/min/{1.73_m2} (ref >59)
GFR calc non Af Amer: 83 mL/min/{1.73_m2} (ref >59)
Globulin, Total: 3.2 g/dL (ref 1.5–4.5)
Glucose: 95 mg/dL (ref 65–99)
Potassium: 3.8 mmol/L (ref 3.5–5.2)
Sodium: 139 mmol/L (ref 134–144)
Total Protein: 7.3 g/dL (ref 6.0–8.5)

## 2019-10-18 LAB — CBC WITH DIFFERENTIAL/PLATELET
Basophils Absolute: 0 10*3/uL (ref 0.0–0.2)
Basos: 0 %
EOS (ABSOLUTE): 0.1 10*3/uL (ref 0.0–0.4)
Eos: 2 %
Hematocrit: 29.1 % — ABNORMAL LOW (ref 34.0–46.6)
Hemoglobin: 8.7 g/dL — ABNORMAL LOW (ref 11.1–15.9)
Immature Grans (Abs): 0 10*3/uL (ref 0.0–0.1)
Immature Granulocytes: 0 %
Lymphocytes Absolute: 2.4 10*3/uL (ref 0.7–3.1)
Lymphs: 35 %
MCH: 21.7 pg — ABNORMAL LOW (ref 26.6–33.0)
MCHC: 29.9 g/dL — ABNORMAL LOW (ref 31.5–35.7)
MCV: 73 fL — ABNORMAL LOW (ref 79–97)
Monocytes Absolute: 0.4 10*3/uL (ref 0.1–0.9)
Monocytes: 5 %
Neutrophils Absolute: 3.9 10*3/uL (ref 1.4–7.0)
Neutrophils: 58 %
Platelets: 295 10*3/uL (ref 150–450)
RBC: 4.01 x10E6/uL (ref 3.77–5.28)
RDW: 17.3 % — ABNORMAL HIGH (ref 11.7–15.4)
WBC: 6.9 10*3/uL (ref 3.4–10.8)

## 2019-10-18 LAB — LIPID PANEL W/O CHOL/HDL RATIO
Cholesterol, Total: 146 mg/dL (ref 100–199)
HDL: 63 mg/dL (ref >39)
LDL Chol Calc (NIH): 69 mg/dL (ref 0–99)
Triglycerides: 70 mg/dL (ref 0–149)
VLDL Cholesterol Cal: 14 mg/dL (ref 5–40)

## 2019-10-18 LAB — FERRITIN: Ferritin: 9 ng/mL — ABNORMAL LOW (ref 15–150)

## 2019-10-18 LAB — TSH: TSH: 2.96 u[IU]/mL (ref 0.450–4.500)

## 2019-10-20 ENCOUNTER — Other Ambulatory Visit: Payer: Self-pay | Admitting: Internal Medicine

## 2019-10-20 LAB — MICROSCOPIC EXAMINATION

## 2019-10-20 LAB — MICROALBUMIN, URINE WAIVED
Creatinine, Urine Waived: 200 mg/dL (ref 10–300)
Microalb, Ur Waived: 30 mg/L — ABNORMAL HIGH (ref 0–19)
Microalb/Creat Ratio: <30 mg/g (ref <30)

## 2019-10-20 LAB — URINE CULTURE, REFLEX

## 2019-10-20 LAB — UA/M W/RFLX CULTURE, ROUTINE
Bilirubin, UA: NEGATIVE
Glucose, UA: NEGATIVE
Ketones, UA: NEGATIVE
Nitrite, UA: NEGATIVE
Protein,UA: NEGATIVE
Specific Gravity, UA: 1.025 (ref 1.005–1.030)
Urobilinogen, Ur: 1 mg/dL (ref 0.2–1.0)
pH, UA: 6 (ref 5.0–7.5)

## 2019-10-20 NOTE — Progress Notes (Signed)
Pt scheduled for IV venofer on 4/9.  GB

## 2019-10-26 ENCOUNTER — Encounter: Payer: Self-pay | Admitting: Family Medicine

## 2019-10-28 ENCOUNTER — Other Ambulatory Visit: Payer: Self-pay

## 2019-10-28 ENCOUNTER — Inpatient Hospital Stay: Payer: PRIVATE HEALTH INSURANCE

## 2019-10-28 ENCOUNTER — Inpatient Hospital Stay: Payer: PRIVATE HEALTH INSURANCE | Attending: Internal Medicine | Admitting: Internal Medicine

## 2019-10-28 ENCOUNTER — Telehealth: Payer: PRIVATE HEALTH INSURANCE | Admitting: Family Medicine

## 2019-10-28 DIAGNOSIS — D500 Iron deficiency anemia secondary to blood loss (chronic): Secondary | ICD-10-CM

## 2019-10-28 DIAGNOSIS — R5383 Other fatigue: Secondary | ICD-10-CM | POA: Diagnosis not present

## 2019-10-28 DIAGNOSIS — Z886 Allergy status to analgesic agent status: Secondary | ICD-10-CM | POA: Insufficient documentation

## 2019-10-28 DIAGNOSIS — I10 Essential (primary) hypertension: Secondary | ICD-10-CM | POA: Insufficient documentation

## 2019-10-28 DIAGNOSIS — Z8349 Family history of other endocrine, nutritional and metabolic diseases: Secondary | ICD-10-CM | POA: Insufficient documentation

## 2019-10-28 DIAGNOSIS — N920 Excessive and frequent menstruation with regular cycle: Secondary | ICD-10-CM | POA: Diagnosis not present

## 2019-10-28 DIAGNOSIS — Z596 Low income: Secondary | ICD-10-CM | POA: Insufficient documentation

## 2019-10-28 DIAGNOSIS — M7989 Other specified soft tissue disorders: Secondary | ICD-10-CM | POA: Insufficient documentation

## 2019-10-28 DIAGNOSIS — Z823 Family history of stroke: Secondary | ICD-10-CM | POA: Insufficient documentation

## 2019-10-28 DIAGNOSIS — K30 Functional dyspepsia: Secondary | ICD-10-CM | POA: Diagnosis not present

## 2019-10-28 DIAGNOSIS — Z8249 Family history of ischemic heart disease and other diseases of the circulatory system: Secondary | ICD-10-CM | POA: Insufficient documentation

## 2019-10-28 DIAGNOSIS — Z79899 Other long term (current) drug therapy: Secondary | ICD-10-CM | POA: Diagnosis not present

## 2019-10-28 DIAGNOSIS — R079 Chest pain, unspecified: Secondary | ICD-10-CM | POA: Diagnosis not present

## 2019-10-28 MED ORDER — SODIUM CHLORIDE 0.9 % IV SOLN
Freq: Once | INTRAVENOUS | Status: AC
  Filled 2019-10-28: qty 250

## 2019-10-28 MED ORDER — IRON SUCROSE 20 MG/ML IV SOLN
200.0000 mg | Freq: Once | INTRAVENOUS | Status: AC
  Administered 2019-10-28: 200 mg via INTRAVENOUS
  Filled 2019-10-28: qty 10

## 2019-10-28 NOTE — Progress Notes (Signed)
[facility] CONSULT NOTE  Patient Care Team: Dorcas Carrow, DO as PCP - General (Family Medicine)  CHIEF COMPLAINTS/PURPOSE OF CONSULTATION: Iron deficient anemia.   HEMATOLOGY HISTORY  # CHRONIC ANEMIA- iron deficiency likely from menorrhagia [ EGD/colonoscopy/capsule-none] s/p Ferrahem x3 [? Reaction to Ferrahem-chest tightness/heaviness]; Aug 2020- change to Venofer.  ;  HISTORY OF PRESENTING ILLNESS:  Rachel Salas 44 y.o.  female with a history of iron deficient anemia likely secondary to heavy menstrual.  She is here for follow-up.  Patient is not on p.o. iron because of poor tolerance/stomach upset.  Patient was seen in the emergency room at [facility] because of chest pain.  Acute coronary process was ruled out.  Patient chest pain is current resolved.  Work-up in the ER showed hemoglobin of 8.   Complains of fatigue.  Mild swelling in the legs.  No nausea no vomiting.  Continues to have heavy menstrual cycles.  She has tried IUD.  Reluctant to have any surgical procedures.  Review of Systems  Constitutional: Positive for malaise/fatigue. Negative for diaphoresis, fever and weight loss.  HENT: Negative for nosebleeds and sore throat.   Eyes: Negative for double vision.  Respiratory: Negative for cough, hemoptysis, sputum production, shortness of breath and wheezing.   Cardiovascular: Negative for chest pain, palpitations, orthopnea and leg swelling.  Gastrointestinal: Negative for abdominal pain, blood in stool, constipation, diarrhea, heartburn, melena, nausea and vomiting.  Genitourinary: Negative for dysuria, frequency and urgency.  Musculoskeletal: Positive for myalgias. Negative for back pain and joint pain.  Skin: Negative.  Negative for itching and rash.  Neurological: Negative for dizziness, tingling, focal weakness, weakness and headaches.  Endo/Heme/Allergies: Does not bruise/bleed easily.  Psychiatric/Behavioral: Negative for depression. The  patient is not nervous/anxious and does not have insomnia.     MEDICAL HISTORY:  Past Medical History:  Diagnosis Date  . Adenomyosis 11/15/2014   u/s suspicious for adenomyosis  . Essential hypertension, benign 10/14/2012  . GERD (gastroesophageal reflux disease)   . Hypertension   . Hypokalemia   . IDA (iron deficiency anemia)   . Menorrhagia with irregular cycle   . Pseudotumor cerebri dx 2007  . Severe obesity (HCC)     SURGICAL HISTORY: Past Surgical History:  Procedure Laterality Date  . TONSILLECTOMY  2007  . TONSILLECTOMY  2007  . TUBAL LIGATION  2002    SOCIAL HISTORY: Social History   Socioeconomic History  . Marital status: Married    Spouse name: Not on file  . Number of children: 3  . Years of education: BS  . Highest education level: Not on file  Occupational History    Employer: [employer]  Tobacco Use  . Smoking status: Never Smoker  . Smokeless tobacco: Never Used  Substance and Sexual Activity  . Alcohol use: Yes    Alcohol/week: 0.0 standard drinks    Comment: Rare/Occasional  . Drug use: No  . Sexual activity: Yes    Partners: Male    Birth control/protection: Surgical, None    Comment: BTL  Other Topics Concern  . 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 No smoking; occasional alcohol.    Social Determinants of Health   Financial Resource Strain:   . Difficulty of Paying Living Expenses:   Food Insecurity:   . Worried About Programme researcher, broadcasting/film/video in the Last Year:   .  Ran Out of Food in the Last Year:   Transportation Needs:   . Freight forwarder (Medical):   Marland Kitchen Lack of Transportation (Non-Medical):   Physical Activity:   . Days of Exercise per Week:   . Minutes of Exercise per Session:   Stress:   . Feeling of Stress :   Social Connections:   . Frequency of Communication with Friends and Family:   . Frequency of Social Gatherings with  Friends and Family:   . Attends Religious Services:   . Active Member of Clubs or Organizations:   . Attends Banker Meetings:   Marland Kitchen Marital Status:   Intimate Partner Violence:   . Fear of Current or Ex-Partner:   . Emotionally Abused:   Marland Kitchen Physically Abused:   . Sexually Abused:     FAMILY HISTORY: Family History  Problem Relation Age of Onset  . Hypertension Mother   . Hypercholesterolemia Mother   . Thyroid disease Mother   . Stroke Maternal Grandmother   . Colon cancer Neg Hx   . Breast cancer Neg Hx   . Ovarian cancer Neg Hx   . Heart disease Neg Hx     ALLERGIES:  is allergic to tylenol [acetaminophen] and other.  MEDICATIONS:  Current Outpatient Medications  Medication Sig Dispense Refill  . buPROPion (WELLBUTRIN SR) 150 MG 12 hr tablet 1 pill in AM for 3 days, then increase to 1 pill BID 60 tablet 3  . cyclobenzaprine (FLEXERIL) 10 MG tablet Take 10 mg by mouth at bedtime. PRN    . Probiotic Product (PROBIOTIC/PREBIOTIC/CRANBERRY) CAPS Take by mouth daily.    . valACYclovir (VALTREX) 1000 MG tablet Take 2 tablets (2,000 mg total) by mouth 2 (two) times daily. For 1 day. Start as soon as you feel the symptoms starting PRN 40 tablet 12   No current facility-administered medications for this visit.      PHYSICAL EXAMINATION:   Vitals:   10/28/19 1332  BP: (!) 132/98  Pulse: 87  Resp: 18  Temp: 98.7 F (37.1 C)  SpO2: 99%   Filed Weights   10/28/19 1332  Weight: 247 lb (112 kg)    Physical Exam  Constitutional: She is oriented to person, place, and time and well-developed, well-nourished, and in no distress.  HENT:  Head: Normocephalic and atraumatic.  Mouth/Throat: Oropharynx is clear and moist. No oropharyngeal exudate.  Eyes: Pupils are equal, round, and reactive to light.  Cardiovascular: Normal rate and regular rhythm.  Pulmonary/Chest: No respiratory distress. She has no wheezes.  Abdominal: Soft. Bowel sounds are normal. She  exhibits no distension and no mass. There is no abdominal tenderness. There is no rebound and no guarding.  Musculoskeletal:        General: No tenderness or edema. Normal range of motion.     Cervical back: Normal range of motion and neck supple.  Neurological: She is alert and oriented to person, place, and time.  Skin: Skin is warm. There is pallor.  Psychiatric: Affect normal.     LABORATORY DATA:  I have reviewed the data as listed Lab Results  Component Value Date   WBC 6.9 10/17/2019   HGB 8.7 (L) 10/17/2019   HCT 29.1 (L) 10/17/2019   MCV 73 (L) 10/17/2019   PLT 295 10/17/2019   Recent Labs    02/28/19 1258 04/15/19 0945 10/17/19 0838  NA 139 142 139  K 3.2* 3.7 3.8  CL 107 107* 106  CO2 24 24 22  GLUCOSE 151* 90 95  BUN 12 14 10   CREATININE 1.07* 0.98 0.87  CALCIUM 8.3* 8.7 8.8  GFRNONAA >60 72 83  GFRAA >60 83 96  PROT  --   --  7.3  ALBUMIN  --   --  4.1  AST  --   --  12  ALT  --   --  5  ALKPHOS  --   --  76  BILITOT  --   --  0.2     No results found.  Iron deficiency anemia due to chronic blood loss # IDA sec to menorrahgia-status post IV iron-hemoglobin today is 8 [[place]]; chemistries normal.  #Recommend proceeding with Venofer today; again weekly x3.  #History of menorrhagia-defer management to PCP/gynecology-regarding birth control pills.  Not improved.  # DISPOSITION: # venofer today;  # Venofer weekly x3 #Follow-up in 3 months-MD/CBC; possible venofer-Dr.B   All questions were answered. The patient knows to call the clinic with any problems, questions or concerns.   Cammie Sickle, MD 10/28/2019 1:34 PM

## 2019-10-28 NOTE — Assessment & Plan Note (Addendum)
#   IDA sec to menorrahgia-status post IV iron-hemoglobin today is 8 [[place]]; chemistries normal.  #Recommend proceeding with Venofer today; again weekly x3.  #History of menorrhagia-defer management to PCP/gynecology-regarding birth control pills.  Not improved.  # DISPOSITION: # venofer today;  # Venofer weekly x3 #Follow-up in 3 months-MD/CBC; possible venofer-Dr.B

## 2019-11-01 ENCOUNTER — Other Ambulatory Visit: Payer: Self-pay

## 2019-11-01 ENCOUNTER — Inpatient Hospital Stay: Payer: PRIVATE HEALTH INSURANCE

## 2019-11-01 VITALS — BP 140/98 | HR 88 | Temp 97.5°F | Resp 18

## 2019-11-01 DIAGNOSIS — D500 Iron deficiency anemia secondary to blood loss (chronic): Secondary | ICD-10-CM

## 2019-11-01 MED ORDER — SODIUM CHLORIDE 0.9 % IV SOLN
Freq: Once | INTRAVENOUS | Status: AC
  Filled 2019-11-01: qty 250

## 2019-11-01 MED ORDER — IRON SUCROSE 20 MG/ML IV SOLN
200.0000 mg | Freq: Once | INTRAVENOUS | Status: AC
  Administered 2019-11-01: 14:00:00 200 mg via INTRAVENOUS
  Filled 2019-11-01: qty 10

## 2019-11-08 ENCOUNTER — Other Ambulatory Visit: Payer: Self-pay

## 2019-11-08 ENCOUNTER — Inpatient Hospital Stay: Payer: PRIVATE HEALTH INSURANCE

## 2019-11-08 DIAGNOSIS — D500 Iron deficiency anemia secondary to blood loss (chronic): Secondary | ICD-10-CM | POA: Diagnosis not present

## 2019-11-08 MED ORDER — IRON SUCROSE 20 MG/ML IV SOLN
200.0000 mg | Freq: Once | INTRAVENOUS | Status: AC
  Administered 2019-11-08: 14:00:00 200 mg via INTRAVENOUS
  Filled 2019-11-08: qty 10

## 2019-11-08 MED ORDER — SODIUM CHLORIDE 0.9 % IV SOLN
Freq: Once | INTRAVENOUS | Status: AC
  Filled 2019-11-08: qty 250

## 2019-11-14 ENCOUNTER — Other Ambulatory Visit: Payer: Self-pay

## 2019-11-14 ENCOUNTER — Ambulatory Visit (INDEPENDENT_AMBULATORY_CARE_PROVIDER_SITE_OTHER): Payer: PRIVATE HEALTH INSURANCE | Admitting: Family Medicine

## 2019-11-14 ENCOUNTER — Encounter: Payer: Self-pay | Admitting: Family Medicine

## 2019-11-14 VITALS — BP 131/91 | HR 88 | Temp 98.4°F | Wt 237.0 lb

## 2019-11-14 DIAGNOSIS — IMO0001 Reserved for inherently not codable concepts without codable children: Secondary | ICD-10-CM

## 2019-11-14 DIAGNOSIS — I10 Essential (primary) hypertension: Secondary | ICD-10-CM

## 2019-11-14 DIAGNOSIS — R911 Solitary pulmonary nodule: Secondary | ICD-10-CM

## 2019-11-14 DIAGNOSIS — F321 Major depressive disorder, single episode, moderate: Secondary | ICD-10-CM | POA: Diagnosis not present

## 2019-11-14 MED ORDER — METOPROLOL SUCCINATE ER 25 MG PO TB24: 25.0000 mg | ORAL_TABLET | Freq: Every day | ORAL | 3 refills | Status: DC

## 2019-11-14 MED ORDER — BUPROPION HCL ER (SR) 150 MG PO TB12: 150.0000 mg | ORAL_TABLET | Freq: Two times a day (BID) | ORAL | 1 refills | Status: DC

## 2019-11-14 NOTE — Assessment & Plan Note (Signed)
Still running borderline with elevated HRs. Will restart metoprolol and recheck 1 month. Call with any concerns.

## 2019-11-14 NOTE — Assessment & Plan Note (Signed)
Found incidentally on CT 10/28/19. Patient is anxious about this- will repeat CT in 1 year. Low risk.

## 2019-11-14 NOTE — Progress Notes (Signed)
BP (!) 131/91 (BP Location: Left Arm, Patient Position: Sitting, Cuff Size: Large)   Pulse 88   Temp 98.4 F (36.9 C) (Oral)   Wt 237 lb (107.5 kg)   SpO2 99%   BMI 37.55 kg/m    Subjective:    Patient ID: Rachel Salas, female    DOB: 1976-04-12, 44 y.o.   MRN: [id number]  HPI: [name] is a 44 y.o. female  Chief Complaint  Patient presents with  . Hypertension  . Depression  . Medication Reaction    [place] - Trimmers and elevated heart rate   HR has been jumping up to the 120s-130s for a few minutes when she's moving around, then drops back down.   DEPRESSION Mood status: better Satisfied with current treatment?: yes Symptom severity: mild  Duration of current treatment : 1 month Side effects: yes Medication compliance: good compliance Psychotherapy/counseling: no  Depressed mood: yes Anxious mood: yes Anhedonia: no Significant weight loss or gain: no Insomnia: no  Fatigue: yes Feelings of worthlessness or guilt: no Impaired concentration/indecisiveness: no Suicidal ideations: no Hopelessness: no Crying spells: no Depression screen [facility] 2/9 11/14/2019 10/17/2019 07/13/2019 11/20/2017 11/17/2016  Decreased Interest 1 2 0 0 0  Down, Depressed, Hopeless 1 2 0 0 0  PHQ - 2 Score 2 4 0 0 0  Altered sleeping 1 3 0 0 -  Tired, decreased energy 1 3 3 1  -  Change in appetite 1 3 0 0 -  Feeling bad or failure about yourself  0 3 0 0 -  Trouble concentrating 0 0 0 0 -  Moving slowly or fidgety/restless 0 0 0 0 -  Suicidal thoughts 0 0 0 0 -  PHQ-9 Score 5 16 3 1  -  Difficult doing work/chores Somewhat difficult Somewhat difficult Not difficult at all Not difficult at all -   HYPERTENSION Hypertension status: stable  Satisfied with current treatment? no Duration of hypertension: chronic BP monitoring frequency:  not checking BP medication side effects:  N/A Previous BP meds:metoprolol Aspirin: no Recurrent headaches: no Visual changes: no Palpitations:  yes Dyspnea: no Chest pain: no Lower extremity edema: no Dizzy/lightheaded: no   Relevant past medical, surgical, family and social history reviewed and updated as indicated. Interim medical history since our last visit reviewed. Allergies and medications reviewed and updated.  Review of Systems  Constitutional: Negative.   Respiratory: Negative.   Cardiovascular: Positive for palpitations. Negative for chest pain and leg swelling.  Gastrointestinal: Negative.   Musculoskeletal: Negative.   Neurological: Positive for tremors. Negative for dizziness, seizures, syncope, facial asymmetry, speech difficulty, weakness, light-headedness, numbness and headaches.  Psychiatric/Behavioral: Negative.     Per HPI unless specifically indicated above     Objective:    BP (!) 131/91 (BP Location: Left Arm, Patient Position: Sitting, Cuff Size: Large)   Pulse 88   Temp 98.4 F (36.9 C) (Oral)   Wt 237 lb (107.5 kg)   SpO2 99%   BMI 37.55 kg/m   Wt Readings from Last 3 Encounters:  11/14/19 237 lb (107.5 kg)  10/28/19 247 lb (112 kg)  10/17/19 245 lb 2 oz (111.2 kg)    Physical Exam Vitals and nursing note reviewed.  Constitutional:      General: She is not in acute distress.    Appearance: Normal appearance. She is not ill-appearing, toxic-appearing or diaphoretic.  HENT:     Head: Normocephalic and atraumatic.     Right Ear: External ear normal.  Left Ear: External ear normal.     Nose: Nose normal.     Mouth/Throat:     Mouth: Mucous membranes are moist.     Pharynx: Oropharynx is clear.  Eyes:     General: No scleral icterus.       Right eye: No discharge.        Left eye: No discharge.     Extraocular Movements: Extraocular movements intact.     Conjunctiva/sclera: Conjunctivae normal.     Pupils: Pupils are equal, round, and reactive to light.  Cardiovascular:     Rate and Rhythm: Normal rate and regular rhythm.     Pulses: Normal pulses.     Heart sounds: Normal  heart sounds. No murmur. No friction rub. No gallop.   Pulmonary:     Effort: Pulmonary effort is normal. No respiratory distress.     Breath sounds: Normal breath sounds. No stridor. No wheezing, rhonchi or rales.  Chest:     Chest wall: No tenderness.  Musculoskeletal:        General: Normal range of motion.     Cervical back: Normal range of motion and neck supple.  Skin:    General: Skin is warm and dry.     Capillary Refill: Capillary refill takes less than 2 seconds.     Coloration: Skin is not jaundiced or pale.     Findings: No bruising, erythema, lesion or rash.  Neurological:     General: No focal deficit present.     Mental Status: She is alert and oriented to person, place, and time. Mental status is at baseline.  Psychiatric:        Mood and Affect: Mood normal.        Behavior: Behavior normal.        Thought Content: Thought content normal.        Judgment: Judgment normal.     Results for orders placed or performed in visit on 10/17/19  Microscopic Examination   URINE  Result Value Ref Range   WBC, UA 6-10 (A) 0 - 5 /hpf   RBC 3-10 (A) 0 - 2 /hpf   Epithelial Cells (non renal) 0-10 0 - 10 /hpf   Bacteria, UA Moderate (A) None seen/Few  Urine Culture, Reflex   URINE  Result Value Ref Range   Urine Culture, Routine Final report    Organism ID, Bacteria Comment   CBC with Differential/Platelet  Result Value Ref Range   WBC 6.9 3.4 - 10.8 x10E3/uL   RBC 4.01 3.77 - 5.28 x10E6/uL   Hemoglobin 8.7 (L) 11.1 - 15.9 g/dL   Hematocrit 29.1 (L) 34.0 - 46.6 %   MCV 73 (L) 79 - 97 fL   MCH 21.7 (L) 26.6 - 33.0 pg   MCHC 29.9 (L) 31.5 - 35.7 g/dL   RDW 17.3 (H) 11.7 - 15.4 %   Platelets 295 150 - 450 x10E3/uL   Neutrophils 58 Not Estab. %   Lymphs 35 Not Estab. %   Monocytes 5 Not Estab. %   Eos 2 Not Estab. %   Basos 0 Not Estab. %   Neutrophils Absolute 3.9 1.4 - 7.0 x10E3/uL   Lymphocytes Absolute 2.4 0.7 - 3.1 x10E3/uL   Monocytes Absolute 0.4 0.1 - 0.9  x10E3/uL   EOS (ABSOLUTE) 0.1 0.0 - 0.4 x10E3/uL   Basophils Absolute 0.0 0.0 - 0.2 x10E3/uL   Immature Granulocytes 0 Not Estab. %   Immature Grans (Abs) 0.0 0.0 - 0.1 x10E3/uL  Comprehensive metabolic panel  Result Value Ref Range   Glucose 95 65 - 99 mg/dL   BUN 10 6 - 24 mg/dL   Creatinine, Ser 2.77 0.57 - 1.00 mg/dL   GFR calc non Af Amer 83 >59 mL/min/1.73   GFR calc Af Amer 96 >59 mL/min/1.73   BUN/Creatinine Ratio 11 9 - 23   Sodium 139 134 - 144 mmol/L   Potassium 3.8 3.5 - 5.2 mmol/L   Chloride 106 96 - 106 mmol/L   CO2 22 20 - 29 mmol/L   Calcium 8.8 8.7 - 10.2 mg/dL   Total Protein 7.3 6.0 - 8.5 g/dL   Albumin 4.1 3.8 - 4.8 g/dL   Globulin, Total 3.2 1.5 - 4.5 g/dL   Albumin/Globulin Ratio 1.3 1.2 - 2.2   Bilirubin Total 0.2 0.0 - 1.2 mg/dL   Alkaline Phosphatase 76 39 - 117 IU/L   AST 12 0 - 40 IU/L   ALT 5 0 - 32 IU/L  Lipid Panel w/o Chol/HDL Ratio  Result Value Ref Range   Cholesterol, Total 146 100 - 199 mg/dL   Triglycerides 70 0 - 149 mg/dL   HDL 63 >82 mg/dL   VLDL Cholesterol Cal 14 5 - 40 mg/dL   LDL Chol Calc (NIH) 69 0 - 99 mg/dL  TSH  Result Value Ref Range   TSH 2.960 0.450 - 4.500 uIU/mL  UA/M w/rflx Culture, Routine   Specimen: Urine   URINE  Result Value Ref Range   Specific Gravity, UA 1.025 1.005 - 1.030   pH, UA 6.0 5.0 - 7.5   Color, UA Yellow Yellow   Appearance Ur Hazy (A) Clear   Leukocytes,UA 1+ (A) Negative   Protein,UA Negative Negative/Trace   Glucose, UA Negative Negative   Ketones, UA Negative Negative   RBC, UA Trace (A) Negative   Bilirubin, UA Negative Negative   Urobilinogen, Ur 1.0 0.2 - 1.0 mg/dL   Nitrite, UA Negative Negative   Microscopic Examination See below:    Urinalysis Reflex Comment   Microalbumin, Urine Waived  Result Value Ref Range   Microalb, Ur Waived 30 (H) 0 - 19 mg/L   Creatinine, Urine Waived 200 10 - 300 mg/dL   Microalb/Creat Ratio <30 <30 mg/g  Iron and TIBC  Result Value Ref Range    Total Iron Binding Capacity 310 250 - 450 ug/dL   UIBC 423 536 - 144 ug/dL   Iron 17 (L) 27 - 315 ug/dL   Iron Saturation 5 (LL) 15 - 55 %  Ferritin  Result Value Ref Range   Ferritin 9 (L) 15 - 150 ng/mL      Assessment & Plan:   Problem List Items Addressed This Visit      Cardiovascular and Mediastinum   HTN (hypertension) - Primary    Still running borderline with elevated HRs. Will restart metoprolol and recheck 1 month. Call with any concerns.       Relevant Medications   metoprolol succinate (TOPROL-XL) 25 MG 24 hr tablet     Other   Depression, major, single episode, moderate (HCC)    Doing much better on her wellbutrin. Continue current regimen. Continue to monitor. Call with any concerns.       Relevant Medications   buPROPion (WELLBUTRIN SR) 150 MG 12 hr tablet   Lung nodule < 6cm on CT    Found incidentally on CT 10/28/19. Patient is anxious about this- will repeat CT in 1 year. Low risk.  Follow up plan: Return in about 4 weeks (around 12/12/2019).

## 2019-11-14 NOTE — Assessment & Plan Note (Signed)
Doing much better on her wellbutrin. Continue current regimen. Continue to monitor. Call with any concerns.

## 2019-12-04 ENCOUNTER — Encounter: Payer: Self-pay | Admitting: Family Medicine

## 2019-12-15 ENCOUNTER — Ambulatory Visit: Payer: PRIVATE HEALTH INSURANCE | Admitting: Family Medicine

## 2019-12-27 ENCOUNTER — Ambulatory Visit: Payer: PRIVATE HEALTH INSURANCE | Admitting: Family Medicine

## 2020-01-13 ENCOUNTER — Ambulatory Visit: Payer: PRIVATE HEALTH INSURANCE | Admitting: Family Medicine

## 2020-01-24 ENCOUNTER — Inpatient Hospital Stay: Payer: PRIVATE HEALTH INSURANCE | Admitting: Internal Medicine

## 2020-01-24 ENCOUNTER — Inpatient Hospital Stay: Payer: PRIVATE HEALTH INSURANCE

## 2020-02-02 ENCOUNTER — Encounter: Payer: Self-pay | Admitting: Family Medicine

## 2020-02-03 ENCOUNTER — Other Ambulatory Visit: Payer: Self-pay | Admitting: Nurse Practitioner

## 2020-02-03 MED ORDER — VALACYCLOVIR HCL 1 G PO TABS: 2000.0000 mg | ORAL_TABLET | Freq: Two times a day (BID) | ORAL | 12 refills | Status: DC

## 2020-02-28 ENCOUNTER — Other Ambulatory Visit: Payer: Self-pay

## 2020-02-28 ENCOUNTER — Other Ambulatory Visit: Payer: Self-pay | Admitting: *Deleted

## 2020-02-28 ENCOUNTER — Inpatient Hospital Stay: Payer: PRIVATE HEALTH INSURANCE

## 2020-02-28 ENCOUNTER — Encounter: Payer: Self-pay | Admitting: Internal Medicine

## 2020-02-28 ENCOUNTER — Inpatient Hospital Stay: Payer: PRIVATE HEALTH INSURANCE | Attending: Internal Medicine | Admitting: Internal Medicine

## 2020-02-28 VITALS — BP 131/95 | HR 75 | Resp 16

## 2020-02-28 DIAGNOSIS — I10 Essential (primary) hypertension: Secondary | ICD-10-CM | POA: Insufficient documentation

## 2020-02-28 DIAGNOSIS — M791 Myalgia, unspecified site: Secondary | ICD-10-CM | POA: Diagnosis not present

## 2020-02-28 DIAGNOSIS — D500 Iron deficiency anemia secondary to blood loss (chronic): Secondary | ICD-10-CM

## 2020-02-28 DIAGNOSIS — Z8349 Family history of other endocrine, nutritional and metabolic diseases: Secondary | ICD-10-CM | POA: Diagnosis not present

## 2020-02-28 DIAGNOSIS — N920 Excessive and frequent menstruation with regular cycle: Secondary | ICD-10-CM | POA: Diagnosis not present

## 2020-02-28 DIAGNOSIS — Z79899 Other long term (current) drug therapy: Secondary | ICD-10-CM | POA: Insufficient documentation

## 2020-02-28 DIAGNOSIS — Z8249 Family history of ischemic heart disease and other diseases of the circulatory system: Secondary | ICD-10-CM | POA: Diagnosis not present

## 2020-02-28 DIAGNOSIS — R5383 Other fatigue: Secondary | ICD-10-CM | POA: Diagnosis not present

## 2020-02-28 DIAGNOSIS — Z823 Family history of stroke: Secondary | ICD-10-CM | POA: Insufficient documentation

## 2020-02-28 DIAGNOSIS — Z8342 Family history of familial hypercholesterolemia: Secondary | ICD-10-CM | POA: Diagnosis not present

## 2020-02-28 LAB — CBC WITH DIFFERENTIAL/PLATELET
Abs Immature Granulocytes: 0.01 10*3/uL (ref 0.00–0.07)
Basophils Absolute: 0 10*3/uL (ref 0.0–0.1)
Basophils Relative: 0 %
Eosinophils Absolute: 0.1 10*3/uL (ref 0.0–0.5)
Eosinophils Relative: 1 %
HCT: 30.8 % — ABNORMAL LOW (ref 36.0–46.0)
Hemoglobin: 9.9 g/dL — ABNORMAL LOW (ref 12.0–15.0)
Immature Granulocytes: 0 %
Lymphocytes Relative: 31 %
Lymphs Abs: 2.1 10*3/uL (ref 0.7–4.0)
MCH: 24.3 pg — ABNORMAL LOW (ref 26.0–34.0)
MCHC: 32.1 g/dL (ref 30.0–36.0)
MCV: 75.5 fL — ABNORMAL LOW (ref 80.0–100.0)
Monocytes Absolute: 0.3 10*3/uL (ref 0.1–1.0)
Monocytes Relative: 5 %
Neutro Abs: 4.2 10*3/uL (ref 1.7–7.7)
Neutrophils Relative %: 63 %
Platelets: 187 10*3/uL (ref 150–400)
RBC: 4.08 MIL/uL (ref 3.87–5.11)
RDW: 16.1 % — ABNORMAL HIGH (ref 11.5–15.5)
WBC: 6.7 10*3/uL (ref 4.0–10.5)
nRBC: 0 % (ref 0.0–0.2)

## 2020-02-28 MED ORDER — SODIUM CHLORIDE 0.9 % IV SOLN
Freq: Once | INTRAVENOUS | Status: AC
  Filled 2020-02-28: qty 250

## 2020-02-28 MED ORDER — IRON SUCROSE 20 MG/ML IV SOLN
200.0000 mg | Freq: Once | INTRAVENOUS | Status: AC
  Administered 2020-02-28: 200 mg via INTRAVENOUS
  Filled 2020-02-28: qty 10

## 2020-02-28 NOTE — Progress Notes (Signed)
[facility] CONSULT NOTE  Patient Care Team: Dorcas Carrow, DO as PCP - General (Family Medicine)  CHIEF COMPLAINTS/PURPOSE OF CONSULTATION: Iron deficient anemia.   HEMATOLOGY HISTORY  # CHRONIC ANEMIA- iron deficiency likely from menorrhagia [ EGD/colonoscopy/capsule-none] s/p Ferrahem x3 [? Reaction to Ferrahem-chest tightness/heaviness]; Aug 2020- change to Venofer.  Patient is not on p.o. iron because of poor tolerance/stomach upset.  ; #Menorrhagia-reluctant with IUD/surgical procedures.   HISTORY OF PRESENTING ILLNESS:  Rachel Salas 44 y.o.  female with a history of iron deficient anemia likely secondary to heavy menstrual is here for follow-up.   Patient continues to have heavy menstrual cycles.  Patient has previously IUD; reluctant with any surgical procedures.  She continues to feel tired.  Review of Systems  Constitutional: Positive for malaise/fatigue. Negative for diaphoresis, fever and weight loss.  HENT: Negative for nosebleeds and sore throat.   Eyes: Negative for double vision.  Respiratory: Negative for cough, hemoptysis, sputum production, shortness of breath and wheezing.   Cardiovascular: Negative for chest pain, palpitations, orthopnea and leg swelling.  Gastrointestinal: Negative for abdominal pain, blood in stool, constipation, diarrhea, heartburn, melena, nausea and vomiting.  Genitourinary: Negative for dysuria, frequency and urgency.  Musculoskeletal: Positive for myalgias. Negative for back pain and joint pain.  Skin: Negative.  Negative for itching and rash.  Neurological: Negative for dizziness, tingling, focal weakness, weakness and headaches.  Endo/Heme/Allergies: Does not bruise/bleed easily.  Psychiatric/Behavioral: Negative for depression. The patient is not nervous/anxious and does not have insomnia.     MEDICAL HISTORY:  Past Medical History:  Diagnosis Date  . Adenomyosis 11/15/2014   u/s suspicious for adenomyosis  .  Essential hypertension, benign 10/14/2012  . GERD (gastroesophageal reflux disease)   . Hypertension   . Hypokalemia   . IDA (iron deficiency anemia)   . Menorrhagia with irregular cycle   . Pseudotumor cerebri dx 2007  . Severe obesity (HCC)     SURGICAL HISTORY: Past Surgical History:  Procedure Laterality Date  . TONSILLECTOMY  2007  . TONSILLECTOMY  2007  . TUBAL LIGATION  2002    SOCIAL HISTORY: Social History   Socioeconomic History  . Marital status: Married    Spouse name: Not on file  . Number of children: 3  . Years of education: BS  . Highest education level: Not on file  Occupational History    Employer:   Tobacco Use  . Smoking status: Never Smoker  . Smokeless tobacco: Never Used  Vaping Use  . Vaping Use: Never used  Substance and Sexual Activity  . Alcohol use: Yes    Alcohol/week: 0.0 standard drinks    Comment: Rare/Occasional  . Drug use: No  . Sexual activity: Yes    Partners: Male    Birth control/protection: Surgical, None    Comment: BTL  Other Topics Concern  . 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 No smoking; occasional alcohol.    Social Determinants of Health   Financial Resource Strain:   . Difficulty of Paying Living Expenses:   Food Insecurity:   . Worried About Programme researcher, broadcasting/film/video in the Last Year:   . Barista in the Last Year:   Transportation Needs:   . Freight forwarder (Medical):   Marland Kitchen Lack of Transportation (Non-Medical):   Physical Activity:   . Days  of Exercise per Week:   . Minutes of Exercise per Session:   Stress:   . Feeling of Stress :   Social Connections:   . Frequency of Communication with Friends and Family:   . Frequency of Social Gatherings with Friends and Family:   . Attends Religious Services:   . Active Member of Clubs or Organizations:   . Attends Banker Meetings:   Marland Kitchen  Marital Status:   Intimate Partner Violence:   . Fear of Current or Ex-Partner:   . Emotionally Abused:   Marland Kitchen Physically Abused:   . Sexually Abused:     FAMILY HISTORY: Family History  Problem Relation Age of Onset  . Hypertension Mother   . Hypercholesterolemia Mother   . Thyroid disease Mother   . Stroke Maternal Grandmother   . Colon cancer Neg Hx   . Breast cancer Neg Hx   . Ovarian cancer Neg Hx   . Heart disease Neg Hx     ALLERGIES:  is allergic to tylenol [acetaminophen] and other.  MEDICATIONS:  Current Outpatient Medications  Medication Sig Dispense Refill  . buPROPion (WELLBUTRIN SR) 150 MG 12 hr tablet Take 1 tablet (150 mg total) by mouth 2 (two) times daily. 180 tablet 1  . cyclobenzaprine (FLEXERIL) 10 MG tablet Take 10 mg by mouth at bedtime. PRN    . metoprolol succinate (TOPROL-XL) 25 MG 24 hr tablet Take 1 tablet (25 mg total) by mouth daily. 30 tablet 3  . Probiotic Product (PROBIOTIC/PREBIOTIC/CRANBERRY) CAPS Take by mouth daily.    . valACYclovir (VALTREX) 1000 MG tablet Take 2 tablets (2,000 mg total) by mouth 2 (two) times daily. For 1 day. Start as soon as you feel the symptoms starting PRN 40 tablet 12   No current facility-administered medications for this visit.   Facility-Administered Medications Ordered in Other Visits  Medication Dose Route Frequency Provider Last Rate Last Admin  . 0.9 %  sodium chloride infusion   Intravenous Once Louretta Shorten R, MD      . iron sucrose (VENOFER) injection 200 mg  200 mg Intravenous Once Louretta Shorten R, MD          PHYSICAL EXAMINATION:   Vitals:   02/28/20 1310  BP: 119/89  Pulse: 86  Resp: 16  Temp: (!) 97.4 F (36.3 C)  SpO2: 100%   Filed Weights   02/28/20 1310  Weight: 233 lb (105.7 kg)    Physical Exam HENT:     Head: Normocephalic and atraumatic.     Mouth/Throat:     Pharynx: No oropharyngeal exudate.  Eyes:     Pupils: Pupils are equal, round, and reactive to light.   Cardiovascular:     Rate and Rhythm: Normal rate and regular rhythm.  Pulmonary:     Effort: No respiratory distress.     Breath sounds: No wheezing.  Abdominal:     General: Bowel sounds are normal. There is no distension.     Palpations: Abdomen is soft. There is no mass.     Tenderness: There is no abdominal tenderness. There is no guarding or rebound.  Musculoskeletal:        General: No tenderness. Normal range of motion.     Cervical back: Normal range of motion and neck supple.  Skin:    General: Skin is warm.     Coloration: Skin is pale.  Neurological:     Mental Status: She is alert and oriented to person, place, and time.  Psychiatric:        Mood and Affect: Affect normal.      LABORATORY DATA:  I have reviewed the data as listed Lab Results  Component Value Date   WBC 6.7 02/28/2020   HGB 9.9 (L) 02/28/2020   HCT 30.8 (L) 02/28/2020   MCV 75.5 (L) 02/28/2020   PLT 187 02/28/2020   Recent Labs    04/15/19 0945 10/17/19 0838  NA 142 139  K 3.7 3.8  CL 107* 106  CO2 24 22  GLUCOSE 90 95  BUN 14 10  CREATININE 0.98 0.87  CALCIUM 8.7 8.8  GFRNONAA 72 83  GFRAA 83 96  PROT  --  7.3  ALBUMIN  --  4.1  AST  --  12  ALT  --  5  ALKPHOS  --  76  BILITOT  --  0.2     No results found.  Iron deficiency anemia due to chronic blood loss # IDA sec to menorrahgia-status post IV iron-hemoglobin today is 8 [[place]]; chemistries normal.  Today hemoglobin is 9.4.  MCV 76.  Suggestive of iron deficiency.  #Recommend proceeding with Venofer today; again weekly x3. reclutant with PO pills.   #History of menorrhagia-defer management to PCP/gynecology-regarding birth control pills. Reluctant.   # DISPOSITION: # venofer today;  # Venofer weekly x3 #Follow-up in 3 months-MD/CBC/iron studies/ferritin; possible venofer-Dr.B   All questions were answered. The patient knows to call the clinic with any problems, questions or concerns.   Earna Coder, MD 02/28/2020 1:51 PM

## 2020-02-28 NOTE — Assessment & Plan Note (Addendum)
#   IDA sec to menorrahgia-status post IV iron-hemoglobin today is 8 [[place]]; chemistries normal.  Today hemoglobin is 9.4.  MCV 76.  Suggestive of iron deficiency.  #Recommend proceeding with Venofer today; again weekly x3. reclutant with PO pills.   #History of menorrhagia-defer management to PCP/gynecology-regarding birth control pills. Reluctant.   # DISPOSITION: # venofer today;  # Venofer weekly x3 #Follow-up in 3 months-MD/CBC/iron studies/ferritin; possible venofer-Dr.B

## 2020-03-06 ENCOUNTER — Inpatient Hospital Stay: Payer: PRIVATE HEALTH INSURANCE

## 2020-03-13 ENCOUNTER — Inpatient Hospital Stay: Payer: PRIVATE HEALTH INSURANCE

## 2020-03-15 ENCOUNTER — Other Ambulatory Visit: Payer: Self-pay

## 2020-03-15 ENCOUNTER — Inpatient Hospital Stay: Payer: PRIVATE HEALTH INSURANCE

## 2020-03-15 VITALS — BP 126/95 | HR 76 | Temp 97.3°F | Resp 17

## 2020-03-15 DIAGNOSIS — D500 Iron deficiency anemia secondary to blood loss (chronic): Secondary | ICD-10-CM

## 2020-03-15 MED ORDER — SODIUM CHLORIDE 0.9 % IV SOLN
Freq: Once | INTRAVENOUS | Status: AC
  Filled 2020-03-15: qty 250

## 2020-03-15 MED ORDER — IRON SUCROSE 20 MG/ML IV SOLN
200.0000 mg | Freq: Once | INTRAVENOUS | Status: AC
  Administered 2020-03-15: 200 mg via INTRAVENOUS
  Filled 2020-03-15: qty 10

## 2020-03-15 NOTE — Progress Notes (Signed)
Pt tolerated infusion well. No s/s of distress or reaction noted. Pt and VS stable at discharge.  

## 2020-03-20 ENCOUNTER — Inpatient Hospital Stay: Payer: PRIVATE HEALTH INSURANCE

## 2020-03-28 ENCOUNTER — Other Ambulatory Visit: Payer: Self-pay

## 2020-03-28 ENCOUNTER — Inpatient Hospital Stay: Payer: PRIVATE HEALTH INSURANCE | Attending: Internal Medicine

## 2020-03-28 VITALS — BP 134/98 | HR 74 | Temp 96.1°F | Resp 16

## 2020-03-28 DIAGNOSIS — I10 Essential (primary) hypertension: Secondary | ICD-10-CM | POA: Insufficient documentation

## 2020-03-28 DIAGNOSIS — Z79899 Other long term (current) drug therapy: Secondary | ICD-10-CM | POA: Diagnosis not present

## 2020-03-28 DIAGNOSIS — R5383 Other fatigue: Secondary | ICD-10-CM | POA: Insufficient documentation

## 2020-03-28 DIAGNOSIS — Z823 Family history of stroke: Secondary | ICD-10-CM | POA: Diagnosis not present

## 2020-03-28 DIAGNOSIS — M791 Myalgia, unspecified site: Secondary | ICD-10-CM | POA: Insufficient documentation

## 2020-03-28 DIAGNOSIS — N920 Excessive and frequent menstruation with regular cycle: Secondary | ICD-10-CM | POA: Diagnosis not present

## 2020-03-28 DIAGNOSIS — Z8249 Family history of ischemic heart disease and other diseases of the circulatory system: Secondary | ICD-10-CM | POA: Insufficient documentation

## 2020-03-28 DIAGNOSIS — D500 Iron deficiency anemia secondary to blood loss (chronic): Secondary | ICD-10-CM | POA: Diagnosis not present

## 2020-03-28 DIAGNOSIS — Z8349 Family history of other endocrine, nutritional and metabolic diseases: Secondary | ICD-10-CM | POA: Insufficient documentation

## 2020-03-28 MED ORDER — SODIUM CHLORIDE 0.9 % IV SOLN
Freq: Once | INTRAVENOUS | Status: AC
  Filled 2020-03-28: qty 250

## 2020-03-28 MED ORDER — IRON SUCROSE 20 MG/ML IV SOLN
200.0000 mg | Freq: Once | INTRAVENOUS | Status: AC
  Administered 2020-03-28: 200 mg via INTRAVENOUS
  Filled 2020-03-28: qty 10

## 2020-04-03 ENCOUNTER — Other Ambulatory Visit: Payer: Self-pay

## 2020-04-03 ENCOUNTER — Inpatient Hospital Stay: Payer: PRIVATE HEALTH INSURANCE

## 2020-04-03 VITALS — BP 125/92 | HR 73 | Temp 98.2°F | Resp 18

## 2020-04-03 DIAGNOSIS — D500 Iron deficiency anemia secondary to blood loss (chronic): Secondary | ICD-10-CM | POA: Diagnosis not present

## 2020-04-03 MED ORDER — IRON SUCROSE 20 MG/ML IV SOLN
200.0000 mg | Freq: Once | INTRAVENOUS | Status: AC
  Administered 2020-04-03: 200 mg via INTRAVENOUS
  Filled 2020-04-03: qty 10

## 2020-04-03 MED ORDER — SODIUM CHLORIDE 0.9 % IV SOLN
Freq: Once | INTRAVENOUS | Status: AC
  Filled 2020-04-03: qty 250

## 2020-04-09 ENCOUNTER — Ambulatory Visit: Payer: PRIVATE HEALTH INSURANCE | Admitting: Family Medicine

## 2020-04-30 ENCOUNTER — Ambulatory Visit: Payer: PRIVATE HEALTH INSURANCE | Admitting: Family Medicine

## 2020-05-18 ENCOUNTER — Ambulatory Visit: Payer: PRIVATE HEALTH INSURANCE | Admitting: Family Medicine

## 2020-05-22 ENCOUNTER — Encounter: Payer: Self-pay | Admitting: Family Medicine

## 2020-05-22 ENCOUNTER — Ambulatory Visit: Payer: PRIVATE HEALTH INSURANCE | Admitting: Family Medicine

## 2020-05-22 ENCOUNTER — Other Ambulatory Visit: Payer: Self-pay

## 2020-05-22 VITALS — BP 149/100 | HR 73 | Temp 98.3°F | Ht 66.0 in | Wt 236.0 lb

## 2020-05-22 DIAGNOSIS — D509 Iron deficiency anemia, unspecified: Secondary | ICD-10-CM | POA: Diagnosis not present

## 2020-05-22 DIAGNOSIS — F321 Major depressive disorder, single episode, moderate: Secondary | ICD-10-CM | POA: Diagnosis not present

## 2020-05-22 DIAGNOSIS — I10 Essential (primary) hypertension: Secondary | ICD-10-CM | POA: Diagnosis not present

## 2020-05-22 DIAGNOSIS — D500 Iron deficiency anemia secondary to blood loss (chronic): Secondary | ICD-10-CM

## 2020-05-22 DIAGNOSIS — Z13220 Encounter for screening for lipoid disorders: Secondary | ICD-10-CM

## 2020-05-22 MED ORDER — LOSARTAN POTASSIUM 25 MG PO TABS: 25.0000 mg | ORAL_TABLET | Freq: Every day | ORAL | 3 refills | Status: DC

## 2020-05-22 MED ORDER — METOPROLOL SUCCINATE ER 25 MG PO TB24
25.0000 mg | ORAL_TABLET | Freq: Every day | ORAL | 3 refills | Status: DC
Start: 2020-05-22 — End: 2020-11-16

## 2020-05-22 MED ORDER — BUPROPION HCL ER (SR) 150 MG PO TB12
150.0000 mg | ORAL_TABLET | Freq: Two times a day (BID) | ORAL | 1 refills | Status: DC
Start: 2020-05-22 — End: 2020-11-16

## 2020-05-22 NOTE — Progress Notes (Signed)
BP (!) 149/100 (BP Location: Left Arm, Patient Position: Sitting)    Pulse 73    Temp 98.3 F (36.8 C) (Oral)    Ht 5\' 6"  (1.676 m)    Wt 236 lb (107 kg)    SpO2 97%    BMI 38.09 kg/m    Subjective:    Patient ID: , female    DOB: 1975/07/29, 44 y.o.   MRN: [id number]  HPI: Rachel Salas is a 44 y.o. female  Chief Complaint  Patient presents with   Hypertension    f/u   HYPERTENSION Hypertension status: exacerbated  Satisfied with current treatment? no Duration of hypertension: chronic BP monitoring frequency:  a few times a week BP range: 140s/90s  BP medication side effects:  no Medication compliance: excellent compliance Previous BP meds: metoprolol Aspirin: no Recurrent headaches: no Visual changes: no Palpitations: no Dyspnea: no Chest pain: no Lower extremity edema: no Dizzy/lightheaded: no  DEPRESSION Mood status: controlled Satisfied with current treatment?: yes Symptom severity: mild  Duration of current treatment : chronic Side effects: no Medication compliance: excellent compliance Psychotherapy/counseling: no  Previous psychiatric medications: wellbutrin Depressed mood: no Anxious mood: no Anhedonia: no Significant weight loss or gain: no Insomnia: no  Fatigue: no Feelings of worthlessness or guilt: no Impaired concentration/indecisiveness: no Suicidal ideations: no Hopelessness: no Crying spells: no Depression screen [facility] 2/9 05/22/2020 11/14/2019 10/17/2019 07/13/2019 11/20/2017  Decreased Interest 0 1 2 0 0  Down, Depressed, Hopeless 0 1 2 0 0  PHQ - 2 Score 0 2 4 0 0  Altered sleeping 0 1 3 0 0  Tired, decreased energy 0 1 3 3 1   Change in appetite 0 1 3 0 0  Feeling bad or failure about yourself  0 0 3 0 0  Trouble concentrating 0 0 0 0 0  Moving slowly or fidgety/restless 0 0 0 0 0  Suicidal thoughts 0 0 0 0 0  PHQ-9 Score 0 5 16 3 1   Difficult doing work/chores Not difficult at all Somewhat difficult Somewhat difficult  Not difficult at all Not difficult at all    Relevant past medical, surgical, family and social history reviewed and updated as indicated. Interim medical history since our last visit reviewed. Allergies and medications reviewed and updated.  Review of Systems  Constitutional: Negative.   Respiratory: Negative.   Cardiovascular: Negative.   Gastrointestinal: Negative.   Musculoskeletal: Negative.   Neurological: Negative.   Psychiatric/Behavioral: Negative.     Per HPI unless specifically indicated above     Objective:    BP (!) 149/100 (BP Location: Left Arm, Patient Position: Sitting)    Pulse 73    Temp 98.3 F (36.8 C) (Oral)    Ht 5\' 6"  (1.676 m)    Wt 236 lb (107 kg)    SpO2 97%    BMI 38.09 kg/m   Wt Readings from Last 3 Encounters:  05/22/20 236 lb (107 kg)  02/28/20 233 lb (105.7 kg)  11/14/19 237 lb (107.5 kg)    Physical Exam Vitals and nursing note reviewed.  Constitutional:      General: She is not in acute distress.    Appearance: Normal appearance. She is not ill-appearing, toxic-appearing or diaphoretic.  HENT:     Head: Normocephalic and atraumatic.     Right Ear: External ear normal.     Left Ear: External ear normal.     Nose: Nose normal.     Mouth/Throat:  Mouth: Mucous membranes are moist.     Pharynx: Oropharynx is clear.  Eyes:     General: No scleral icterus.       Right eye: No discharge.        Left eye: No discharge.     Extraocular Movements: Extraocular movements intact.     Conjunctiva/sclera: Conjunctivae normal.     Pupils: Pupils are equal, round, and reactive to light.  Cardiovascular:     Rate and Rhythm: Normal rate and regular rhythm.     Pulses: Normal pulses.     Heart sounds: Normal heart sounds. No murmur heard.  No friction rub. No gallop.   Pulmonary:     Effort: Pulmonary effort is normal. No respiratory distress.     Breath sounds: Normal breath sounds. No stridor. No wheezing, rhonchi or rales.  Chest:      Chest wall: No tenderness.  Musculoskeletal:        General: Normal range of motion.     Cervical back: Normal range of motion and neck supple.  Skin:    General: Skin is warm and dry.     Capillary Refill: Capillary refill takes less than 2 seconds.     Coloration: Skin is not jaundiced or pale.     Findings: No bruising, erythema, lesion or rash.  Neurological:     General: No focal deficit present.     Mental Status: She is alert and oriented to person, place, and time. Mental status is at baseline.  Psychiatric:        Mood and Affect: Mood normal.        Behavior: Behavior normal.        Thought Content: Thought content normal.        Judgment: Judgment normal.     Results for orders placed or performed in visit on 02/28/20  CBC with Differential  Result Value Ref Range   WBC 6.7 4.0 - 10.5 K/uL   RBC 4.08 3.87 - 5.11 MIL/uL   Hemoglobin 9.9 (L) 12.0 - 15.0 g/dL   HCT 62.8 (L) 36 - 46 %   MCV 75.5 (L) 80.0 - 100.0 fL   MCH 24.3 (L) 26.0 - 34.0 pg   MCHC 32.1 30.0 - 36.0 g/dL   RDW 31.5 (H) 17.6 - 16.0 %   Platelets 187 150 - 400 K/uL   nRBC 0.0 0.0 - 0.2 %   Neutrophils Relative % 63 %   Neutro Abs 4.2 1.7 - 7.7 K/uL   Lymphocytes Relative 31 %   Lymphs Abs 2.1 0.7 - 4.0 K/uL   Monocytes Relative 5 %   Monocytes Absolute 0.3 0.1 - 1.0 K/uL   Eosinophils Relative 1 %   Eosinophils Absolute 0.1 0.0 - 0.5 K/uL   Basophils Relative 0 %   Basophils Absolute 0.0 0.0 - 0.1 K/uL   Immature Granulocytes 0 %   Abs Immature Granulocytes 0.01 0.00 - 0.07 K/uL      Assessment & Plan:   Problem List Items Addressed This Visit      Cardiovascular and Mediastinum   HTN (hypertension)    Running a bit high, will continue her metoprolol and add losartan. Recheck 1-2 months. Call with any concerns.       Relevant Medications   losartan (COZAAR) 25 MG tablet   metoprolol succinate (TOPROL-XL) 25 MG 24 hr tablet     Other   Anemia    Rechecking labs today. Await results.  Treat as needed.  Relevant Orders   CBC with Differential/Platelet   Iron deficiency anemia due to chronic blood loss   Relevant Orders   CBC with Differential/Platelet   Depression, major, single episode, moderate (HCC) - Primary    Under good control on current regimen. Continue current regimen. Continue to monitor. Call with any concerns. Refills given. Labs drawn today.        Relevant Medications   buPROPion (WELLBUTRIN SR) 150 MG 12 hr tablet   Other Relevant Orders   TSH    Other Visit Diagnoses    Essential hypertension       Relevant Medications   losartan (COZAAR) 25 MG tablet   metoprolol succinate (TOPROL-XL) 25 MG 24 hr tablet   Other Relevant Orders   Comprehensive metabolic panel   Urinalysis, Routine w reflex microscopic   Screening for cholesterol level       Labs drawn today. Await results.    Relevant Orders   Lipid Panel w/o Chol/HDL Ratio       Follow up plan: Return during her winter break.

## 2020-05-22 NOTE — Assessment & Plan Note (Signed)
Running a bit high, will continue her metoprolol and add losartan. Recheck 1-2 months. Call with any concerns.

## 2020-05-22 NOTE — Assessment & Plan Note (Signed)
Rechecking labs today. Await results. Treat as needed.  °

## 2020-05-22 NOTE — Assessment & Plan Note (Signed)
Under good control on current regimen. Continue current regimen. Continue to monitor. Call with any concerns. Refills given. Labs drawn today.   

## 2020-05-23 LAB — CBC WITH DIFFERENTIAL/PLATELET
Basophils Absolute: 0 10*3/uL (ref 0.0–0.2)
Basos: 0 %
EOS (ABSOLUTE): 0.1 10*3/uL (ref 0.0–0.4)
Eos: 1 %
Hematocrit: 33.5 % — ABNORMAL LOW (ref 34.0–46.6)
Hemoglobin: 10.4 g/dL — ABNORMAL LOW (ref 11.1–15.9)
Immature Grans (Abs): 0 10*3/uL (ref 0.0–0.1)
Immature Granulocytes: 0 %
Lymphocytes Absolute: 1.7 10*3/uL (ref 0.7–3.1)
Lymphs: 28 %
MCH: 25.4 pg — ABNORMAL LOW (ref 26.6–33.0)
MCHC: 31 g/dL — ABNORMAL LOW (ref 31.5–35.7)
MCV: 82 fL (ref 79–97)
Monocytes Absolute: 0.3 10*3/uL (ref 0.1–0.9)
Monocytes: 5 %
Neutrophils Absolute: 3.9 10*3/uL (ref 1.4–7.0)
Neutrophils: 66 %
Platelets: 245 10*3/uL (ref 150–450)
RBC: 4.09 x10E6/uL (ref 3.77–5.28)
RDW: 17.1 % — ABNORMAL HIGH (ref 11.7–15.4)
WBC: 6 10*3/uL (ref 3.4–10.8)

## 2020-05-23 LAB — TSH: TSH: 2.27 u[IU]/mL (ref 0.450–4.500)

## 2020-05-23 LAB — LIPID PANEL W/O CHOL/HDL RATIO
Cholesterol, Total: 145 mg/dL (ref 100–199)
HDL: 55 mg/dL (ref >39)
LDL Chol Calc (NIH): 75 mg/dL (ref 0–99)
Triglycerides: 77 mg/dL (ref 0–149)
VLDL Cholesterol Cal: 15 mg/dL (ref 5–40)

## 2020-05-23 LAB — COMPREHENSIVE METABOLIC PANEL
ALT: 6 IU/L (ref 0–32)
AST: 13 IU/L (ref 0–40)
Albumin/Globulin Ratio: 1.3 (ref 1.2–2.2)
Albumin: 4 g/dL (ref 3.8–4.8)
Alkaline Phosphatase: 81 IU/L (ref 44–121)
BUN/Creatinine Ratio: 13 (ref 9–23)
BUN: 13 mg/dL (ref 6–24)
Bilirubin Total: 0.3 mg/dL (ref 0.0–1.2)
CO2: 20 mmol/L (ref 20–29)
Calcium: 8.5 mg/dL — ABNORMAL LOW (ref 8.7–10.2)
Chloride: 106 mmol/L (ref 96–106)
Creatinine, Ser: 0.98 mg/dL (ref 0.57–1.00)
GFR calc Af Amer: 82 mL/min/{1.73_m2} (ref >59)
GFR calc non Af Amer: 71 mL/min/{1.73_m2} (ref >59)
Globulin, Total: 3.2 g/dL (ref 1.5–4.5)
Glucose: 100 mg/dL — ABNORMAL HIGH (ref 65–99)
Potassium: 4 mmol/L (ref 3.5–5.2)
Sodium: 139 mmol/L (ref 134–144)
Total Protein: 7.2 g/dL (ref 6.0–8.5)

## 2020-05-29 ENCOUNTER — Other Ambulatory Visit: Payer: Self-pay

## 2020-05-29 ENCOUNTER — Telehealth: Payer: Self-pay

## 2020-05-29 DIAGNOSIS — D500 Iron deficiency anemia secondary to blood loss (chronic): Secondary | ICD-10-CM

## 2020-05-30 ENCOUNTER — Inpatient Hospital Stay: Payer: PRIVATE HEALTH INSURANCE

## 2020-05-30 ENCOUNTER — Inpatient Hospital Stay: Payer: PRIVATE HEALTH INSURANCE | Admitting: Internal Medicine

## 2020-06-19 ENCOUNTER — Telehealth: Payer: Self-pay | Admitting: *Deleted

## 2020-06-19 ENCOUNTER — Inpatient Hospital Stay: Payer: PRIVATE HEALTH INSURANCE

## 2020-06-19 ENCOUNTER — Telehealth: Payer: Self-pay | Admitting: Internal Medicine

## 2020-06-19 ENCOUNTER — Inpatient Hospital Stay: Payer: PRIVATE HEALTH INSURANCE | Admitting: Internal Medicine

## 2020-06-19 ENCOUNTER — Encounter: Payer: Self-pay | Admitting: *Deleted

## 2020-06-19 NOTE — Progress Notes (Deleted)
[facility] CONSULT NOTE  Patient Care Team: Dorcas Carrow, DO as PCP - General (Family Medicine)  CHIEF COMPLAINTS/PURPOSE OF CONSULTATION: Iron deficient anemia.   HEMATOLOGY HISTORY  # CHRONIC ANEMIA- iron deficiency likely from menorrhagia [ EGD/colonoscopy/capsule-none] s/p Ferrahem x3 [? Reaction to Ferrahem-chest tightness/heaviness]; Aug 2020- change to Venofer.  Patient is not on p.o. iron because of poor tolerance/stomach upset.  ; #Menorrhagia-reluctant with IUD/surgical procedures.   HISTORY OF PRESENTING ILLNESS:  Rachel Salas 44 y.o.  female with a history of iron deficient anemia likely secondary to heavy menstrual is here for follow-up.   Patient continues to have heavy menstrual cycles.  Patient has previously IUD; reluctant with any surgical procedures.  She continues to feel tired.  Review of Systems  Constitutional: Positive for malaise/fatigue. Negative for diaphoresis, fever and weight loss.  HENT: Negative for nosebleeds and sore throat.   Eyes: Negative for double vision.  Respiratory: Negative for cough, hemoptysis, sputum production, shortness of breath and wheezing.   Cardiovascular: Negative for chest pain, palpitations, orthopnea and leg swelling.  Gastrointestinal: Negative for abdominal pain, blood in stool, constipation, diarrhea, heartburn, melena, nausea and vomiting.  Genitourinary: Negative for dysuria, frequency and urgency.  Musculoskeletal: Positive for myalgias. Negative for back pain and joint pain.  Skin: Negative.  Negative for itching and rash.  Neurological: Negative for dizziness, tingling, focal weakness, weakness and headaches.  Endo/Heme/Allergies: Does not bruise/bleed easily.  Psychiatric/Behavioral: Negative for depression. The patient is not nervous/anxious and does not have insomnia.     MEDICAL HISTORY:  Past Medical History:  Diagnosis Date  . Adenomyosis 11/15/2014   u/s suspicious for adenomyosis  .  Essential hypertension, benign 10/14/2012  . GERD (gastroesophageal reflux disease)   . Hypertension   . Hypokalemia   . IDA (iron deficiency anemia)   . Menorrhagia with irregular cycle   . Pseudotumor cerebri dx 2007  . Severe obesity (HCC)     SURGICAL HISTORY: Past Surgical History:  Procedure Laterality Date  . TONSILLECTOMY  2007  . TONSILLECTOMY  2007  . TUBAL LIGATION  2002    SOCIAL HISTORY: Social History   Socioeconomic History  . Marital status: Single    Spouse name: Not on file  . Number of children: 3  . Years of education: BS  . Highest education level: Not on file  Occupational History    Employer: [employer]  Tobacco Use  . Smoking status: Never Smoker  . Smokeless tobacco: Never Used  Vaping Use  . Vaping Use: Never used  Substance and Sexual Activity  . Alcohol use: Yes    Alcohol/week: 0.0 standard drinks    Comment: Rare/Occasional  . Drug use: No  . Sexual activity: Yes    Partners: Male    Birth control/protection: Surgical, None    Comment: BTL  Other Topics Concern  . 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 No smoking; occasional alcohol.    Social Determinants of Health   Financial Resource Strain:   . Difficulty of Paying Living Expenses: Not on file  Food Insecurity:   . Worried About Programme researcher, broadcasting/film/video in the Last Year: Not on file  . Ran Out of Food in the Last Year: Not on file  Transportation Needs:   . Lack of Transportation (Medical): Not on file  . Lack of Transportation (Non-Medical):  Not on file  Physical Activity:   . Days of Exercise per Week: Not on file  . Minutes of Exercise per Session: Not on file  Stress:   . Feeling of Stress : Not on file  Social Connections:   . Frequency of Communication with Friends and Family: Not on file  . Frequency of Social Gatherings with Friends and Family: Not on file  . Attends  Religious Services: Not on file  . Active Member of Clubs or Organizations: Not on file  . Attends Banker Meetings: Not on file  . Marital Status: Not on file  Intimate Partner Violence:   . Fear of Current or Ex-Partner: Not on file  . Emotionally Abused: Not on file  . Physically Abused: Not on file  . Sexually Abused: Not on file    FAMILY HISTORY: Family History  Problem Relation Age of Onset  . Hypertension Mother   . Hypercholesterolemia Mother   . Thyroid disease Mother   . Stroke Maternal Grandmother   . Colon cancer Neg Hx   . Breast cancer Neg Hx   . Ovarian cancer Neg Hx   . Heart disease Neg Hx     ALLERGIES:  is allergic to tylenol [acetaminophen] and other.  MEDICATIONS:  Current Outpatient Medications  Medication Sig Dispense Refill  . buPROPion (WELLBUTRIN SR) 150 MG 12 hr tablet Take 1 tablet (150 mg total) by mouth 2 (two) times daily. 180 tablet 1  . cyclobenzaprine (FLEXERIL) 10 MG tablet Take 10 mg by mouth at bedtime. PRN    . losartan (COZAAR) 25 MG tablet Take 1 tablet (25 mg total) by mouth daily. 30 tablet 3  . metoprolol succinate (TOPROL-XL) 25 MG 24 hr tablet Take 1 tablet (25 mg total) by mouth daily. 30 tablet 3  . Probiotic Product (PROBIOTIC/PREBIOTIC/CRANBERRY) CAPS Take by mouth daily.    . valACYclovir (VALTREX) 1000 MG tablet Take 2 tablets (2,000 mg total) by mouth 2 (two) times daily. For 1 day. Start as soon as you feel the symptoms starting PRN 40 tablet 12   No current facility-administered medications for this visit.      PHYSICAL EXAMINATION:   There were no vitals filed for this visit. There were no vitals filed for this visit.  Physical Exam HENT:     Head: Normocephalic and atraumatic.     Mouth/Throat:     Pharynx: No oropharyngeal exudate.  Eyes:     Pupils: Pupils are equal, round, and reactive to light.  Cardiovascular:     Rate and Rhythm: Normal rate and regular rhythm.  Pulmonary:     Effort:  No respiratory distress.     Breath sounds: No wheezing.  Abdominal:     General: Bowel sounds are normal. There is no distension.     Palpations: Abdomen is soft. There is no mass.     Tenderness: There is no abdominal tenderness. There is no guarding or rebound.  Musculoskeletal:        General: No tenderness. Normal range of motion.     Cervical back: Normal range of motion and neck supple.  Skin:    General: Skin is warm.     Coloration: Skin is pale.  Neurological:     Mental Status: She is alert and oriented to person, place, and time.  Psychiatric:        Mood and Affect: Affect normal.      LABORATORY DATA:  I have reviewed the data as listed  Lab Results  Component Value Date   WBC 6.0 05/22/2020   HGB 10.4 (L) 05/22/2020   HCT 33.5 (L) 05/22/2020   MCV 82 05/22/2020   PLT 245 05/22/2020   Recent Labs    10/17/19 0838 05/22/20 1637  NA 139 139  K 3.8 4.0  CL 106 106  CO2 22 20  GLUCOSE 95 100*  BUN 10 13  CREATININE 0.87 0.98  CALCIUM 8.8 8.5*  GFRNONAA 83 71  GFRAA 96 82  PROT 7.3 7.2  ALBUMIN 4.1 4.0  AST 12 13  ALT 5 6  ALKPHOS 76 81  BILITOT 0.2 0.3     No results found.  No problem-specific Assessment & Plan notes found for this encounter.   All questions were answered. The patient knows to call the clinic with any problems, questions or concerns.   Earna Coder, MD 06/19/2020 6:45 AM

## 2020-06-19 NOTE — Telephone Encounter (Signed)
Document opened in error

## 2020-06-19 NOTE — Telephone Encounter (Deleted)
Karen from [facility] [phone] contacted the cancer center on 06/18/2020. Patient will be d/c soon from [facility]. [facility] is coordinating patient's discharge and requested Dr. B to follow patient via home health.  Dr. Brahmanday agreed to be ordering provider for home health. verbal orders given to [facility]. °

## 2020-06-19 NOTE — Assessment & Plan Note (Deleted)
#   IDA sec to menorrahgia-status post IV iron-hemoglobin today is 8 [[place]]; chemistries normal.  Today hemoglobin is 9.4.  MCV 76.  Suggestive of iron deficiency.  #Recommend proceeding with Venofer today; again weekly x3. reclutant with PO pills.   #History of menorrhagia-defer management to PCP/gynecology-regarding birth control pills. Reluctant.   # DISPOSITION: # venofer today;  # Venofer weekly x3 #Follow-up in 3 months-MD/CBC/iron studies/ferritin; possible venofer-Dr.B

## 2020-06-19 NOTE — Telephone Encounter (Signed)
Rachel Salas, Please contact patient and reschedule her apts.

## 2020-07-02 ENCOUNTER — Inpatient Hospital Stay: Payer: PRIVATE HEALTH INSURANCE | Admitting: Internal Medicine

## 2020-07-02 ENCOUNTER — Inpatient Hospital Stay: Payer: PRIVATE HEALTH INSURANCE

## 2020-07-02 ENCOUNTER — Inpatient Hospital Stay: Payer: PRIVATE HEALTH INSURANCE | Attending: Internal Medicine

## 2020-07-30 ENCOUNTER — Telehealth: Payer: Self-pay

## 2020-07-30 ENCOUNTER — Telehealth (INDEPENDENT_AMBULATORY_CARE_PROVIDER_SITE_OTHER): Payer: PRIVATE HEALTH INSURANCE | Admitting: Family Medicine

## 2020-07-30 ENCOUNTER — Encounter: Payer: Self-pay | Admitting: Family Medicine

## 2020-07-30 VITALS — BP 124/90 | HR 78

## 2020-07-30 DIAGNOSIS — I10 Essential (primary) hypertension: Secondary | ICD-10-CM

## 2020-07-30 DIAGNOSIS — K5909 Other constipation: Secondary | ICD-10-CM | POA: Diagnosis not present

## 2020-07-30 MED ORDER — LOSARTAN POTASSIUM 50 MG PO TABS
50.0000 mg | ORAL_TABLET | Freq: Every day | ORAL | 1 refills | Status: DC
Start: 2020-07-30 — End: 2020-11-16

## 2020-07-30 MED ORDER — POLYETHYLENE GLYCOL 3350 17 GM/SCOOP PO POWD: 17.0000 g | Freq: Two times a day (BID) | ORAL | 1 refills | Status: DC | PRN

## 2020-07-30 NOTE — Telephone Encounter (Signed)
lvm to make this apt.  

## 2020-07-30 NOTE — Assessment & Plan Note (Signed)
Diastolic still running high- will increase losartan and recheck 1 month with BMP. Continue to monitor. Call with any concerns.

## 2020-07-30 NOTE — Progress Notes (Signed)
BP 124/90   Pulse 78    Subjective:    Patient ID: Rachel Salas, female    DOB: [birth year], 45 y.o.   MRN: [id number]  HPI: Rachel Salas is a 45 y.o. female  Chief Complaint  Patient presents with  . Hypertension  . Constipation    Pt states she has been having constipation for years and would like to know if there is something she can take to help control it.    HYPERTENSION Hypertension status: uncontrolled  Satisfied with current treatment? no Duration of hypertension: chronic BP monitoring frequency:  a few times a week BP range: 120s/90s BP medication side effects:  no Medication compliance: excellent compliance Previous BP meds: losartan, metoprolol Aspirin: no Recurrent headaches: no Visual changes: no Palpitations: no Dyspnea: no Chest pain: no Lower extremity edema: no Dizzy/lightheaded: no   ABDOMINAL ISSUES Duration: chronic, worse Nature: no pain Treatments attempted: senna, water, fiber Constipation: yes- 1x a week- very large, painful Diarrhea: yes Mucous in the stool: no Heartburn: no Bloating:yes Flatulence: no Nausea: no Vomiting: no Melena or hematochezia: bright red blood when she's straining Rash: no Jaundice: no Fever: no Weight loss: no  Relevant past medical, surgical, family and social history reviewed and updated as indicated. Interim medical history since our last visit reviewed. Allergies and medications reviewed and updated.  Review of Systems  Constitutional: Negative.   Respiratory: Negative.   Cardiovascular: Negative.   Gastrointestinal: Positive for blood in stool and constipation. Negative for abdominal distention, abdominal pain, anal bleeding, diarrhea, nausea, rectal pain and vomiting.  Musculoskeletal: Negative.   Skin: Negative.   Neurological: Negative.   Psychiatric/Behavioral: Negative.     Per HPI unless specifically indicated above     Objective:    BP 124/90   Pulse 78   Wt Readings from Last 3  Encounters:  05/22/20 236 lb (107 kg)  02/28/20 233 lb (105.7 kg)  11/14/19 237 lb (107.5 kg)    Physical Exam Vitals and nursing note reviewed.  Pulmonary:     Effort: Pulmonary effort is normal. No respiratory distress.     Comments: Speaking in full sentences Neurological:     Mental Status: She is alert.  Psychiatric:        Mood and Affect: Mood normal.        Behavior: Behavior normal.        Thought Content: Thought content normal.        Judgment: Judgment normal.     Results for orders placed or performed in visit on 05/22/20  CBC with Differential/Platelet  Result Value Ref Range   WBC 6.0 3.4 - 10.8 x10E3/uL   RBC 4.09 3.77 - 5.28 x10E6/uL   Hemoglobin 10.4 (L) 11.1 - 15.9 g/dL   Hematocrit 10.9 (L) 32.3 - 46.6 %   MCV 82 79 - 97 fL   MCH 25.4 (L) 26.6 - 33.0 pg   MCHC 31.0 (L) 31.5 - 35.7 g/dL   RDW 55.7 (H) 32.2 - 02.5 %   Platelets 245 150 - 450 x10E3/uL   Neutrophils 66 Not Estab. %   Lymphs 28 Not Estab. %   Monocytes 5 Not Estab. %   Eos 1 Not Estab. %   Basos 0 Not Estab. %   Neutrophils Absolute 3.9 1.4 - 7.0 x10E3/uL   Lymphocytes Absolute 1.7 0.7 - 3.1 x10E3/uL   Monocytes Absolute 0.3 0.1 - 0.9 x10E3/uL   EOS (ABSOLUTE) 0.1 0.0 - 0.4 x10E3/uL   Basophils  Absolute 0.0 0.0 - 0.2 x10E3/uL   Immature Granulocytes 0 Not Estab. %   Immature Grans (Abs) 0.0 0.0 - 0.1 x10E3/uL  Comprehensive metabolic panel  Result Value Ref Range   Glucose 100 (H) 65 - 99 mg/dL   BUN 13 6 - 24 mg/dL   Creatinine, Ser 7.34 0.57 - 1.00 mg/dL   GFR calc non Af Amer 71 >59 mL/min/1.73   GFR calc Af Amer 82 >59 mL/min/1.73   BUN/Creatinine Ratio 13 9 - 23   Sodium 139 134 - 144 mmol/L   Potassium 4.0 3.5 - 5.2 mmol/L   Chloride 106 96 - 106 mmol/L   CO2 20 20 - 29 mmol/L   Calcium 8.5 (L) 8.7 - 10.2 mg/dL   Total Protein 7.2 6.0 - 8.5 g/dL   Albumin 4.0 3.8 - 4.8 g/dL   Globulin, Total 3.2 1.5 - 4.5 g/dL   Albumin/Globulin Ratio 1.3 1.2 - 2.2   Bilirubin Total  0.3 0.0 - 1.2 mg/dL   Alkaline Phosphatase 81 44 - 121 IU/L   AST 13 0 - 40 IU/L   ALT 6 0 - 32 IU/L  Lipid Panel w/o Chol/HDL Ratio  Result Value Ref Range   Cholesterol, Total 145 100 - 199 mg/dL   Triglycerides 77 0 - 149 mg/dL   HDL 55 >19 mg/dL   VLDL Cholesterol Cal 15 5 - 40 mg/dL   LDL Chol Calc (NIH) 75 0 - 99 mg/dL  TSH  Result Value Ref Range   TSH 2.270 0.450 - 4.500 uIU/mL      Assessment & Plan:   Problem List Items Addressed This Visit      Cardiovascular and Mediastinum   HTN (hypertension) - Primary    Diastolic still running high- will increase losartan and recheck 1 month with BMP. Continue to monitor. Call with any concerns.       Relevant Medications   losartan (COZAAR) 50 MG tablet   Other Relevant Orders   Basic metabolic panel     Digestive   Chronic constipation    Will start miralax BID for 2 weeks then as needed. If not improving consider amitiza vs. Linzess. Conitnue to monitor. Call with any concerns. Continue to monitor.       Relevant Medications   polyethylene glycol powder (GLYCOLAX/MIRALAX) 17 GM/SCOOP powder       Follow up plan: Return in about 4 weeks (around 08/27/2020).   . This visit was completed via telephone due to the restrictions of the COVID-19 pandemic. All issues as above were discussed and addressed but no physical exam was performed. If it was felt that the patient should be evaluated in the office, they were directed there. The patient verbally consented to this visit. Patient was unable to complete an audio/visual visit due to Technical difficulties,Lack of internet. Due to the catastrophic nature of the COVID-19 pandemic, this visit was done through audio contact only. . Location of the patient: home . Location of the provider: home . Those involved with this call:  . Provider: Olevia Perches, DO . CMA: Rolley Sims, CMA . Front Desk/Registration: Harriet Pho  . Time spent on call: 25 minutes on the phone  discussing health concerns. 40 minutes total spent in review of patient's record and preparation of their chart.

## 2020-07-30 NOTE — Telephone Encounter (Signed)
-----   Message from Dorcas Carrow, DO sent at 07/30/2020 11:47 AM EST ----- 4 weeks,

## 2020-07-30 NOTE — Assessment & Plan Note (Signed)
Will start miralax BID for 2 weeks then as needed. If not improving consider amitiza vs. Linzess. Conitnue to monitor. Call with any concerns. Continue to monitor.

## 2020-10-24 ENCOUNTER — Encounter: Payer: Self-pay | Admitting: Family Medicine

## 2020-10-29 ENCOUNTER — Encounter: Payer: Self-pay | Admitting: Family Medicine

## 2020-11-05 ENCOUNTER — Ambulatory Visit: Payer: PRIVATE HEALTH INSURANCE | Admitting: Family Medicine

## 2020-11-16 ENCOUNTER — Encounter: Payer: Self-pay | Admitting: Family Medicine

## 2020-11-16 ENCOUNTER — Telehealth: Payer: Self-pay | Admitting: Family Medicine

## 2020-11-16 ENCOUNTER — Ambulatory Visit: Payer: PRIVATE HEALTH INSURANCE | Admitting: Family Medicine

## 2020-11-16 ENCOUNTER — Other Ambulatory Visit: Payer: Self-pay

## 2020-11-16 VITALS — BP 129/83 | HR 80 | Wt 246.2 lb

## 2020-11-16 DIAGNOSIS — N920 Excessive and frequent menstruation with regular cycle: Secondary | ICD-10-CM | POA: Diagnosis not present

## 2020-11-16 DIAGNOSIS — D500 Iron deficiency anemia secondary to blood loss (chronic): Secondary | ICD-10-CM | POA: Diagnosis not present

## 2020-11-16 DIAGNOSIS — I10 Essential (primary) hypertension: Secondary | ICD-10-CM

## 2020-11-16 DIAGNOSIS — F321 Major depressive disorder, single episode, moderate: Secondary | ICD-10-CM

## 2020-11-16 MED ORDER — BUPROPION HCL ER (SR) 150 MG PO TB12: 150.0000 mg | ORAL_TABLET | Freq: Two times a day (BID) | ORAL | 1 refills | Status: DC

## 2020-11-16 MED ORDER — KETOROLAC TROMETHAMINE 10 MG PO TABS: 10.0000 mg | ORAL_TABLET | Freq: Four times a day (QID) | ORAL | 3 refills | Status: DC | PRN

## 2020-11-16 MED ORDER — METOPROLOL SUCCINATE ER 25 MG PO TB24: 25.0000 mg | ORAL_TABLET | Freq: Every day | ORAL | 1 refills | Status: DC

## 2020-11-16 NOTE — Assessment & Plan Note (Signed)
Rechecking labs today. Await results. Treat as needed.  °

## 2020-11-16 NOTE — Assessment & Plan Note (Signed)
Will try toradol to help with cramps. Discussed the risk of cross- reactivity with naproxen. Warning signs go to ER discussed. Continue to monitor closely.

## 2020-11-16 NOTE — Assessment & Plan Note (Signed)
Under good control on current regimen. Continue current regimen. Continue to monitor. Call with any concerns. Refills given. Labs drawn today.   

## 2020-11-16 NOTE — Progress Notes (Signed)
BP 129/83   Pulse 80   Wt 246 lb 3.2 oz (111.7 kg)   SpO2 99%   BMI 39.74 kg/m    Subjective:    Patient ID: Rachel Salas, female    DOB: August 12, 1975, 45 y.o.   MRN: [id number]  HPI: Rachel Salas is a 45 y.o. female  Chief Complaint  Patient presents with  . Hypertension  . Depression   HYPERTENSION Hypertension status: controlled  Satisfied with current treatment? yes Duration of hypertension: chronic BP monitoring frequency:  not checking BP range:  BP medication side effects:  no Medication compliance: excellent compliance Previous BP meds:metoprolol Aspirin: no Recurrent headaches: no Visual changes: no Palpitations: no Dyspnea: no Chest pain: no Lower extremity edema: no Dizzy/lightheaded: no  DEPRESSION Mood status: controlled Satisfied with current treatment?: yes Symptom severity: mild  Duration of current treatment : chronic Side effects: no Medication compliance: excellent compliance Psychotherapy/counseling: no  Previous psychiatric medications: wellbutrin Depressed mood: no Anxious mood: no Anhedonia: no Significant weight loss or gain: no Insomnia: no  Fatigue: yes Feelings of worthlessness or guilt: no Impaired concentration/indecisiveness: no Suicidal ideations: no Hopelessness: no Crying spells: no Depression screen [facility] 2/9 11/16/2020 05/22/2020 11/14/2019 10/17/2019 07/13/2019  Decreased Interest 0 0 1 2 0  Down, Depressed, Hopeless 1 0 1 2 0  PHQ - 2 Score 1 0 2 4 0  Altered sleeping 0 0 1 3 0  Tired, decreased energy 0 0 1 3 3   Change in appetite 0 0 1 3 0  Feeling bad or failure about yourself  0 0 0 3 0  Trouble concentrating 0 0 0 0 0  Moving slowly or fidgety/restless 0 0 0 0 0  Suicidal thoughts 0 0 0 0 0  PHQ-9 Score 1 0 5 16 3   Difficult doing work/chores - Not difficult at all Somewhat difficult Somewhat difficult Not difficult at all   ANEMIA Anemia status: stable Etiology of anemia: iron def Duration of anemia  treatment: chronic Compliance with treatment: good compliance Iron supplementation side effects: no Severity of anemia: moderate Fatigue: yes Decreased exercise tolerance: yes  Dyspnea on exertion: yes Palpitations: no Bleeding: no Pica: no  Relevant past medical, surgical, family and social history reviewed and updated as indicated. Interim medical history since our last visit reviewed. Allergies and medications reviewed and updated.  Review of Systems  Constitutional: Negative.   Respiratory: Negative.   Cardiovascular: Negative.   Gastrointestinal: Negative.   Genitourinary: Positive for menstrual problem. Negative for decreased urine volume, difficulty urinating, dyspareunia, dysuria, enuresis, flank pain, frequency, genital sores, hematuria, pelvic pain, urgency, vaginal bleeding, vaginal discharge and vaginal pain.  Musculoskeletal: Negative.   Psychiatric/Behavioral: Negative.     Per HPI unless specifically indicated above     Objective:    BP 129/83   Pulse 80   Wt 246 lb 3.2 oz (111.7 kg)   SpO2 99%   BMI 39.74 kg/m   Wt Readings from Last 3 Encounters:  11/16/20 246 lb 3.2 oz (111.7 kg)  05/22/20 236 lb (107 kg)  02/28/20 233 lb (105.7 kg)    Physical Exam Vitals and nursing note reviewed.  Constitutional:      General: She is not in acute distress.    Appearance: Normal appearance. She is not ill-appearing, toxic-appearing or diaphoretic.  HENT:     Head: Normocephalic and atraumatic.     Right Ear: External ear normal.     Left Ear: External ear normal.  Nose: Nose normal.     Mouth/Throat:     Mouth: Mucous membranes are moist.     Pharynx: Oropharynx is clear.  Eyes:     General: No scleral icterus.       Right eye: No discharge.        Left eye: No discharge.     Extraocular Movements: Extraocular movements intact.     Conjunctiva/sclera: Conjunctivae normal.     Pupils: Pupils are equal, round, and reactive to light.  Cardiovascular:      Rate and Rhythm: Normal rate and regular rhythm.     Pulses: Normal pulses.     Heart sounds: Normal heart sounds. No murmur heard. No friction rub. No gallop.   Pulmonary:     Effort: Pulmonary effort is normal. No respiratory distress.     Breath sounds: Normal breath sounds. No stridor. No wheezing, rhonchi or rales.  Chest:     Chest wall: No tenderness.  Musculoskeletal:        General: Normal range of motion.     Cervical back: Normal range of motion and neck supple.  Skin:    General: Skin is warm and dry.     Capillary Refill: Capillary refill takes less than 2 seconds.     Coloration: Skin is pale. Skin is not jaundiced.     Findings: No bruising, erythema, lesion or rash.  Neurological:     General: No focal deficit present.     Mental Status: She is alert and oriented to person, place, and time. Mental status is at baseline.  Psychiatric:        Mood and Affect: Mood normal.        Behavior: Behavior normal.        Thought Content: Thought content normal.        Judgment: Judgment normal.     Results for orders placed or performed in visit on 05/22/20  CBC with Differential/Platelet  Result Value Ref Range   WBC 6.0 3.4 - 10.8 x10E3/uL   RBC 4.09 3.77 - 5.28 x10E6/uL   Hemoglobin 10.4 (L) 11.1 - 15.9 g/dL   Hematocrit 93.7 (L) 90.2 - 46.6 %   MCV 82 79 - 97 fL   MCH 25.4 (L) 26.6 - 33.0 pg   MCHC 31.0 (L) 31.5 - 35.7 g/dL   RDW 40.9 (H) 73.5 - 32.9 %   Platelets 245 150 - 450 x10E3/uL   Neutrophils 66 Not Estab. %   Lymphs 28 Not Estab. %   Monocytes 5 Not Estab. %   Eos 1 Not Estab. %   Basos 0 Not Estab. %   Neutrophils Absolute 3.9 1.4 - 7.0 x10E3/uL   Lymphocytes Absolute 1.7 0.7 - 3.1 x10E3/uL   Monocytes Absolute 0.3 0.1 - 0.9 x10E3/uL   EOS (ABSOLUTE) 0.1 0.0 - 0.4 x10E3/uL   Basophils Absolute 0.0 0.0 - 0.2 x10E3/uL   Immature Granulocytes 0 Not Estab. %   Immature Grans (Abs) 0.0 0.0 - 0.1 x10E3/uL  Comprehensive metabolic panel  Result Value  Ref Range   Glucose 100 (H) 65 - 99 mg/dL   BUN 13 6 - 24 mg/dL   Creatinine, Ser 9.24 0.57 - 1.00 mg/dL   GFR calc non Af Amer 71 >59 mL/min/1.73   GFR calc Af Amer 82 >59 mL/min/1.73   BUN/Creatinine Ratio 13 9 - 23   Sodium 139 134 - 144 mmol/L   Potassium 4.0 3.5 - 5.2 mmol/L   Chloride 106 96 -  106 mmol/L   CO2 20 20 - 29 mmol/L   Calcium 8.5 (L) 8.7 - 10.2 mg/dL   Total Protein 7.2 6.0 - 8.5 g/dL   Albumin 4.0 3.8 - 4.8 g/dL   Globulin, Total 3.2 1.5 - 4.5 g/dL   Albumin/Globulin Ratio 1.3 1.2 - 2.2   Bilirubin Total 0.3 0.0 - 1.2 mg/dL   Alkaline Phosphatase 81 44 - 121 IU/L   AST 13 0 - 40 IU/L   ALT 6 0 - 32 IU/L  Lipid Panel w/o Chol/HDL Ratio  Result Value Ref Range   Cholesterol, Total 145 100 - 199 mg/dL   Triglycerides 77 0 - 149 mg/dL   HDL 55 >41 mg/dL   VLDL Cholesterol Cal 15 5 - 40 mg/dL   LDL Chol Calc (NIH) 75 0 - 99 mg/dL  TSH  Result Value Ref Range   TSH 2.270 0.450 - 4.500 uIU/mL      Assessment & Plan:   Problem List Items Addressed This Visit      Cardiovascular and Mediastinum   HTN (hypertension) - Primary    Under good control on current regimen. Continue current regimen. Continue to monitor. Call with any concerns. Refills given. Labs drawn today.        Relevant Medications   EPINEPHrine 0.3 mg/0.3 mL IJ SOAJ injection   metoprolol succinate (TOPROL-XL) 25 MG 24 hr tablet   Other Relevant Orders   Basic metabolic panel     Other   Heavy periods    Will try toradol to help with cramps. Discussed the risk of cross- reactivity with naproxen. Warning signs go to ER discussed. Continue to monitor closely.      Iron deficiency anemia due to chronic blood loss    Rechecking labs today. Await results. Treat as needed.       Relevant Orders   CBC with Differential/Platelet   Iron and TIBC   Ferritin   Depression, major, single episode, moderate (HCC)    Under good control on current regimen. Continue current regimen. Continue to  monitor. Call with any concerns. Refills given. Labs drawn today.        Relevant Medications   buPROPion (WELLBUTRIN SR) 150 MG 12 hr tablet       Follow up plan: Return in about 6 months (around 05/18/2021) for physical.

## 2020-11-16 NOTE — Telephone Encounter (Signed)
Kristen with [facility] is calling toradol has limit day supply

## 2020-11-16 NOTE — Telephone Encounter (Signed)
Unsure if we need to do anything with this seems to just be a [place]

## 2020-11-17 LAB — CBC WITH DIFFERENTIAL/PLATELET
Basophils Absolute: 0 10*3/uL (ref 0.0–0.2)
Basos: 0 %
EOS (ABSOLUTE): 0.1 10*3/uL (ref 0.0–0.4)
Eos: 2 %
Hematocrit: 29.4 % — ABNORMAL LOW (ref 34.0–46.6)
Hemoglobin: 8.8 g/dL — ABNORMAL LOW (ref 11.1–15.9)
Immature Grans (Abs): 0 10*3/uL (ref 0.0–0.1)
Immature Granulocytes: 0 %
Lymphocytes Absolute: 1.6 10*3/uL (ref 0.7–3.1)
Lymphs: 28 %
MCH: 22.3 pg — ABNORMAL LOW (ref 26.6–33.0)
MCHC: 29.9 g/dL — ABNORMAL LOW (ref 31.5–35.7)
MCV: 75 fL — ABNORMAL LOW (ref 79–97)
Monocytes Absolute: 0.3 10*3/uL (ref 0.1–0.9)
Monocytes: 5 %
Neutrophils Absolute: 3.7 10*3/uL (ref 1.4–7.0)
Neutrophils: 65 %
Platelets: 246 10*3/uL (ref 150–450)
RBC: 3.94 x10E6/uL (ref 3.77–5.28)
RDW: 16.5 % — ABNORMAL HIGH (ref 11.7–15.4)
WBC: 5.7 10*3/uL (ref 3.4–10.8)

## 2020-11-17 LAB — BASIC METABOLIC PANEL
BUN/Creatinine Ratio: 10 (ref 9–23)
BUN: 9 mg/dL (ref 6–24)
CO2: 20 mmol/L (ref 20–29)
Calcium: 8.5 mg/dL — ABNORMAL LOW (ref 8.7–10.2)
Chloride: 104 mmol/L (ref 96–106)
Creatinine, Ser: 0.87 mg/dL (ref 0.57–1.00)
Glucose: 87 mg/dL (ref 65–99)
Potassium: 4.2 mmol/L (ref 3.5–5.2)
Sodium: 139 mmol/L (ref 134–144)
eGFR: 85 mL/min/{1.73_m2} (ref >59)

## 2020-11-17 LAB — IRON AND TIBC
Iron Saturation: 9 % — CL (ref 15–55)
Iron: 27 ug/dL (ref 27–159)
Total Iron Binding Capacity: 315 ug/dL (ref 250–450)
UIBC: 288 ug/dL (ref 131–425)

## 2020-11-17 LAB — FERRITIN: Ferritin: 13 ng/mL — ABNORMAL LOW (ref 15–150)

## 2020-11-22 ENCOUNTER — Encounter: Payer: Self-pay | Admitting: Family Medicine

## 2020-11-22 MED ORDER — CYCLOBENZAPRINE HCL 10 MG PO TABS: 10.0000 mg | ORAL_TABLET | Freq: Every day | ORAL | 2 refills | Status: DC

## 2020-11-28 ENCOUNTER — Telehealth: Payer: Self-pay | Admitting: *Deleted

## 2020-11-28 DIAGNOSIS — D500 Iron deficiency anemia secondary to blood loss (chronic): Secondary | ICD-10-CM

## 2020-11-28 NOTE — Addendum Note (Signed)
Addended by: Mercer Pod E on: 11/28/2020 01:40 PM   Modules accepted: Orders

## 2020-11-28 NOTE — Telephone Encounter (Signed)
Rachel Salas - please arrange for apts.

## 2020-11-28 NOTE — Telephone Encounter (Signed)
-----   Message from Earna Coder, MD sent at 11/27/2020  9:53 PM EDT ----- Hi-Thanks for reaching out to me. Looks like pt did not follow up on her appts. We will get her in for Iron infusions in next couple of weeks  C- schedule venofer weekly x3- start next week.  Follow up in 2 months- with MD- labs-cbc/bmp; possible venofer.   GB ----- Message ----- From: Dorcas Carrow, DO Sent: 11/20/2020   9:32 AM EDT To: Earna Coder, MD  Patient notified of results  by mychart  Dr. Leonard Schwartz- I drew Berdina's labs on Friday, and she's pretty anemic again. She knows and is supposed to be calling you shortly. Thanks!

## 2020-11-28 NOTE — Progress Notes (Signed)
So we reached out to pt to get scheduled and had to leave VM for her. She has been a NS to all the appts since last SEPT. I did offer [facility] for irons since she lives in [place] but not sure if she will call back. We also sent a mychart message.  Thanks

## 2020-11-30 NOTE — Telephone Encounter (Signed)
Our scheduling team has been unable to reach patient.

## 2020-12-04 ENCOUNTER — Encounter: Payer: Self-pay | Admitting: *Deleted

## 2021-01-18 ENCOUNTER — Encounter: Payer: Self-pay | Admitting: Internal Medicine

## 2021-02-18 ENCOUNTER — Ambulatory Visit: Payer: No Typology Code available for payment source | Admitting: Family Medicine

## 2021-02-18 ENCOUNTER — Encounter: Payer: Self-pay | Admitting: Family Medicine

## 2021-02-18 ENCOUNTER — Other Ambulatory Visit: Payer: Self-pay

## 2021-02-18 VITALS — BP 134/82 | HR 69 | Temp 98.1°F | Ht 68.4 in | Wt 254.2 lb

## 2021-02-18 DIAGNOSIS — I10 Essential (primary) hypertension: Secondary | ICD-10-CM

## 2021-02-18 DIAGNOSIS — D500 Iron deficiency anemia secondary to blood loss (chronic): Secondary | ICD-10-CM

## 2021-02-18 MED ORDER — CONTRAVE 8-90 MG PO TB12: ORAL_TABLET | ORAL | 2 refills | Status: DC

## 2021-02-18 NOTE — Assessment & Plan Note (Signed)
S/p ablation. Will check labs. Await results. Call with any concerns.

## 2021-02-18 NOTE — Assessment & Plan Note (Signed)
Under good control on current regimen. Continue current regimen. Continue to monitor. Call with any concerns. Refills given.   

## 2021-02-18 NOTE — Assessment & Plan Note (Signed)
Due to HTN and depression. Will change from wellbutrin to contrave. If not covered- will increase wellbutrin. Recheck 1-2 months.

## 2021-02-18 NOTE — Progress Notes (Signed)
BP 134/82   Pulse 69   Temp 98.1 F (36.7 C) (Oral)   Ht 5' 8.4" (1.737 m)   Wt 254 lb 3.2 oz (115.3 kg)   LMP 01/25/2021 (Exact Date)   SpO2 100%   BMI 38.20 kg/m    Subjective:    Patient ID: Rachel Salas Comment, female    DOB: 1976-04-25, 45 y.o.   MRN: [id number]  HPI: Rachel Salas is a 45 y.o. female  Chief Complaint  Patient presents with   Hypertension   Weight Check    Pt states she has tried contrave in the past, done well but insured stopped covering. Has also been on Wellbutrin, but did not do well with weight loss on it. States she feels more active. States she has tried Weight Watchers and meal prepping.    HYPERTENSION Hypertension status: controlled  Satisfied with current treatment? yes Duration of hypertension: chronic BP monitoring frequency:  not checking BP medication side effects:  no Medication compliance: excellent compliance Previous BP meds:metoprolol Aspirin: no Recurrent headaches: no Visual changes: no Palpitations: no Dyspnea: no Chest pain: no Lower extremity edema: no Dizzy/lightheaded: no  WEIGHT GAIN Duration: chronic Previous attempts at weight loss: yes- meal prepping, weight watchers, contrave Complications of obesity: HTN, GERD, depression Peak weight: current (254) Weight loss goal: to be healthy Weight loss to date: none Requesting obesity pharmacotherapy: yes Current weight loss supplements/medications: no Previous weight loss supplements/meds: yes  ANEMIA Anemia status: better Etiology of anemia: iron def Duration of anemia treatment: chronic  Compliance with treatment: good compliance Iron supplementation side effects: no Severity of anemia: severe Fatigue: no Decreased exercise tolerance: no  Dyspnea on exertion: no Palpitations: no Bleeding: no Pica: no   Relevant past medical, surgical, family and social history reviewed and updated as indicated. Interim medical history since our last visit  reviewed. Allergies and medications reviewed and updated.  Review of Systems  Constitutional: Negative.   Respiratory: Negative.    Cardiovascular: Negative.   Gastrointestinal: Negative.   Musculoskeletal: Negative.   Neurological: Negative.   Psychiatric/Behavioral: Negative.     Per HPI unless specifically indicated above     Objective:    BP 134/82   Pulse 69   Temp 98.1 F (36.7 C) (Oral)   Ht 5' 8.4" (1.737 m)   Wt 254 lb 3.2 oz (115.3 kg)   LMP 01/25/2021 (Exact Date)   SpO2 100%   BMI 38.20 kg/m   Wt Readings from Last 3 Encounters:  02/18/21 254 lb 3.2 oz (115.3 kg)  11/16/20 246 lb 3.2 oz (111.7 kg)  05/22/20 236 lb (107 kg)    Physical Exam Vitals and nursing note reviewed.  Constitutional:      General: She is not in acute distress.    Appearance: Normal appearance. She is not ill-appearing, toxic-appearing or diaphoretic.  HENT:     Head: Normocephalic and atraumatic.     Right Ear: External ear normal.     Left Ear: External ear normal.     Nose: Nose normal.     Mouth/Throat:     Mouth: Mucous membranes are moist.     Pharynx: Oropharynx is clear.  Eyes:     General: No scleral icterus.       Right eye: No discharge.        Left eye: No discharge.     Extraocular Movements: Extraocular movements intact.     Conjunctiva/sclera: Conjunctivae normal.     Pupils: Pupils are equal,  round, and reactive to light.  Cardiovascular:     Rate and Rhythm: Normal rate and regular rhythm.     Pulses: Normal pulses.     Heart sounds: Normal heart sounds. No murmur heard.   No friction rub. No gallop.  Pulmonary:     Effort: Pulmonary effort is normal. No respiratory distress.     Breath sounds: Normal breath sounds. No stridor. No wheezing, rhonchi or rales.  Chest:     Chest wall: No tenderness.  Musculoskeletal:        General: Normal range of motion.     Cervical back: Normal range of motion and neck supple.  Skin:    General: Skin is warm and dry.      Capillary Refill: Capillary refill takes less than 2 seconds.     Coloration: Skin is not jaundiced or pale.     Findings: No bruising, erythema, lesion or rash.  Neurological:     General: No focal deficit present.     Mental Status: She is alert and oriented to person, place, and time. Mental status is at baseline.  Psychiatric:        Mood and Affect: Mood normal.        Behavior: Behavior normal.        Thought Content: Thought content normal.        Judgment: Judgment normal.    Results for orders placed or performed in visit on 71/06/26  Basic metabolic panel  Result Value Ref Range   Glucose 87 65 - 99 mg/dL   BUN 9 6 - 24 mg/dL   Creatinine, Ser 0.87 0.57 - 1.00 mg/dL   eGFR 85 >59 mL/min/1.73   BUN/Creatinine Ratio 10 9 - 23   Sodium 139 134 - 144 mmol/L   Potassium 4.2 3.5 - 5.2 mmol/L   Chloride 104 96 - 106 mmol/L   CO2 20 20 - 29 mmol/L   Calcium 8.5 (L) 8.7 - 10.2 mg/dL  CBC with Differential/Platelet  Result Value Ref Range   WBC 5.7 3.4 - 10.8 x10E3/uL   RBC 3.94 3.77 - 5.28 x10E6/uL   Hemoglobin 8.8 (L) 11.1 - 15.9 g/dL   Hematocrit 29.4 (L) 34.0 - 46.6 %   MCV 75 (L) 79 - 97 fL   MCH 22.3 (L) 26.6 - 33.0 pg   MCHC 29.9 (L) 31.5 - 35.7 g/dL   RDW 16.5 (H) 11.7 - 15.4 %   Platelets 246 150 - 450 x10E3/uL   Neutrophils 65 Not Estab. %   Lymphs 28 Not Estab. %   Monocytes 5 Not Estab. %   Eos 2 Not Estab. %   Basos 0 Not Estab. %   Neutrophils Absolute 3.7 1.4 - 7.0 x10E3/uL   Lymphocytes Absolute 1.6 0.7 - 3.1 x10E3/uL   Monocytes Absolute 0.3 0.1 - 0.9 x10E3/uL   EOS (ABSOLUTE) 0.1 0.0 - 0.4 x10E3/uL   Basophils Absolute 0.0 0.0 - 0.2 x10E3/uL   Immature Granulocytes 0 Not Estab. %   Immature Grans (Abs) 0.0 0.0 - 0.1 x10E3/uL  Iron and TIBC  Result Value Ref Range   Total Iron Binding Capacity 315 250 - 450 ug/dL   UIBC 288 131 - 425 ug/dL   Iron 27 27 - 159 ug/dL   Iron Saturation 9 (LL) 15 - 55 %  Ferritin  Result Value Ref Range    Ferritin 13 (L) 15 - 150 ng/mL      Assessment & Plan:   Problem List Items  Addressed This Visit       Cardiovascular and Mediastinum   HTN (hypertension) - Primary    Under good control on current regimen. Continue current regimen. Continue to monitor. Call with any concerns. Refills given.           Other   Morbid obesity ([place])    Due to HTN and depression. Will change from wellbutrin to contrave. If not covered- will increase wellbutrin. Recheck 1-2 months.        Relevant Medications   Naltrexone-buPROPion HCl ER (CONTRAVE) 8-90 MG TB12   Iron deficiency anemia due to chronic blood loss    S/p ablation. Will check labs. Await results. Call with any concerns.        Relevant Medications   iron polysaccharides (NIFEREX) 150 MG capsule   Other Relevant Orders   CBC with Differential/Platelet   Iron and TIBC   Ferritin     Follow up plan: Return 1-2 months, for physical.

## 2021-02-19 ENCOUNTER — Telehealth: Payer: Self-pay

## 2021-02-19 LAB — CBC WITH DIFFERENTIAL/PLATELET
Basophils Absolute: 0 10*3/uL (ref 0.0–0.2)
Basos: 0 %
EOS (ABSOLUTE): 0.1 10*3/uL (ref 0.0–0.4)
Eos: 1 %
Hematocrit: 31.9 % — ABNORMAL LOW (ref 34.0–46.6)
Hemoglobin: 9.4 g/dL — ABNORMAL LOW (ref 11.1–15.9)
Immature Grans (Abs): 0 10*3/uL (ref 0.0–0.1)
Immature Granulocytes: 0 %
Lymphocytes Absolute: 1.7 10*3/uL (ref 0.7–3.1)
Lymphs: 28 %
MCH: 21.7 pg — ABNORMAL LOW (ref 26.6–33.0)
MCHC: 29.5 g/dL — ABNORMAL LOW (ref 31.5–35.7)
MCV: 74 fL — ABNORMAL LOW (ref 79–97)
Monocytes Absolute: 0.3 10*3/uL (ref 0.1–0.9)
Monocytes: 6 %
Neutrophils Absolute: 3.9 10*3/uL (ref 1.4–7.0)
Neutrophils: 65 %
Platelets: 263 10*3/uL (ref 150–450)
RBC: 4.34 x10E6/uL (ref 3.77–5.28)
RDW: 18.5 % — ABNORMAL HIGH (ref 11.7–15.4)
WBC: 6 10*3/uL (ref 3.4–10.8)

## 2021-02-19 LAB — FERRITIN: Ferritin: 22 ng/mL (ref 15–150)

## 2021-02-19 LAB — IRON AND TIBC
Iron Saturation: 7 % — CL (ref 15–55)
Iron: 23 ug/dL — ABNORMAL LOW (ref 27–159)
Total Iron Binding Capacity: 328 ug/dL (ref 250–450)
UIBC: 305 ug/dL (ref 131–425)

## 2021-02-19 NOTE — Telephone Encounter (Signed)
PA for Contrave initiated and submitted via Cover My Meds. Key: [id number]

## 2021-02-21 ENCOUNTER — Encounter: Payer: Self-pay | Admitting: Family Medicine

## 2021-02-22 MED ORDER — VALACYCLOVIR HCL 1 G PO TABS: 2000.0000 mg | ORAL_TABLET | Freq: Two times a day (BID) | ORAL | 12 refills | Status: DC

## 2021-03-13 ENCOUNTER — Encounter: Payer: Self-pay | Admitting: Internal Medicine

## 2021-03-13 ENCOUNTER — Telehealth: Payer: No Typology Code available for payment source | Admitting: Physician Assistant

## 2021-03-13 DIAGNOSIS — N760 Acute vaginitis: Secondary | ICD-10-CM

## 2021-03-13 MED ORDER — CLINDAMYCIN PHOSPHATE 2 % VA CREA: 1.0000 | TOPICAL_CREAM | Freq: Every day | VAGINAL | 0 refills | Status: DC

## 2021-03-13 MED ORDER — FLUCONAZOLE 150 MG PO TABS: 150.0000 mg | ORAL_TABLET | Freq: Once | ORAL | 0 refills | Status: AC

## 2021-03-13 MED ORDER — METRONIDAZOLE 500 MG PO TABS: 500.0000 mg | ORAL_TABLET | Freq: Three times a day (TID) | ORAL | 0 refills | Status: DC

## 2021-03-13 MED ORDER — CLINDAMYCIN HCL 300 MG PO CAPS: 300.0000 mg | ORAL_CAPSULE | Freq: Three times a day (TID) | ORAL | 0 refills | Status: DC

## 2021-03-13 NOTE — Addendum Note (Signed)
Addended by: Bennie Pierini on: 03/13/2021 08:44 AM   Modules accepted: Orders

## 2021-03-13 NOTE — Progress Notes (Signed)

## 2021-03-13 NOTE — Progress Notes (Signed)
I have spent 5 minutes in review of e-visit questionnaire, review and updating patient chart, medical decision making and response to patient.   Mysha Peeler Cody Zelda Reames, PA-C    

## 2021-04-08 ENCOUNTER — Ambulatory Visit (INDEPENDENT_AMBULATORY_CARE_PROVIDER_SITE_OTHER): Payer: No Typology Code available for payment source | Admitting: Family Medicine

## 2021-04-08 ENCOUNTER — Other Ambulatory Visit: Payer: Self-pay

## 2021-04-08 ENCOUNTER — Encounter: Payer: Self-pay | Admitting: Family Medicine

## 2021-04-08 VITALS — BP 132/86 | HR 74 | Temp 99.0°F | Resp 16 | Ht 68.0 in | Wt 255.6 lb

## 2021-04-08 DIAGNOSIS — I10 Essential (primary) hypertension: Secondary | ICD-10-CM

## 2021-04-08 DIAGNOSIS — Z23 Encounter for immunization: Secondary | ICD-10-CM

## 2021-04-08 DIAGNOSIS — F321 Major depressive disorder, single episode, moderate: Secondary | ICD-10-CM | POA: Diagnosis not present

## 2021-04-08 DIAGNOSIS — Z0000 Encounter for general adult medical examination without abnormal findings: Secondary | ICD-10-CM | POA: Diagnosis not present

## 2021-04-08 DIAGNOSIS — D509 Iron deficiency anemia, unspecified: Secondary | ICD-10-CM | POA: Diagnosis not present

## 2021-04-08 DIAGNOSIS — Z1231 Encounter for screening mammogram for malignant neoplasm of breast: Secondary | ICD-10-CM

## 2021-04-08 DIAGNOSIS — D500 Iron deficiency anemia secondary to blood loss (chronic): Secondary | ICD-10-CM

## 2021-04-08 LAB — URINALYSIS, ROUTINE W REFLEX MICROSCOPIC
Bilirubin, UA: NEGATIVE
Glucose, UA: NEGATIVE
Ketones, UA: NEGATIVE
Leukocytes,UA: NEGATIVE
Nitrite, UA: NEGATIVE
Specific Gravity, UA: >1.03 — ABNORMAL HIGH (ref 1.005–1.030)
Urobilinogen, Ur: 0.2 mg/dL (ref 0.2–1.0)
pH, UA: 6 (ref 5.0–7.5)

## 2021-04-08 LAB — MICROSCOPIC EXAMINATION: WBC, UA: NONE SEEN /hpf (ref 0–5)

## 2021-04-08 LAB — MICROALBUMIN, URINE WAIVED
Creatinine, Urine Waived: 300 mg/dL (ref 10–300)
Microalb, Ur Waived: 30 mg/L — ABNORMAL HIGH (ref 0–19)
Microalb/Creat Ratio: <30 mg/g (ref <30)

## 2021-04-08 MED ORDER — METOPROLOL SUCCINATE ER 25 MG PO TB24: 25.0000 mg | ORAL_TABLET | Freq: Every day | ORAL | 1 refills | Status: DC

## 2021-04-08 MED ORDER — BUPROPION HCL ER (SR) 150 MG PO TB12: 150.0000 mg | ORAL_TABLET | Freq: Two times a day (BID) | ORAL | 1 refills | Status: DC

## 2021-04-08 NOTE — Assessment & Plan Note (Signed)
Rechecking labs today. Await results. Treat as needed.  °

## 2021-04-08 NOTE — Progress Notes (Signed)
BP 132/86   Pulse 74   Temp 99 F (37.2 C) (Oral)   Resp 16   Ht 5\' 8"  (1.727 m)   Wt 255 lb 9.6 oz (115.9 kg)   LMP 03/22/2021   SpO2 100%   BMI 38.86 kg/m    Subjective:    Patient ID: 05/22/2021 Epp, female    DOB: 1975-12-17, 45 y.o.   MRN: [id number]  HPI: Rachel Salas is a 45 y.o. female presenting on 04/08/2021 for comprehensive medical examination. Current medical complaints include:  HYPERTENSION Hypertension status: stable  Satisfied with current treatment? yes Duration of hypertension: chronic BP monitoring frequency:  a few times a week BP medication side effects:  no Medication compliance: excellent compliance Previous BP meds:metoprolol Aspirin: no Recurrent headaches: no Visual changes: no Palpitations: no Dyspnea: no Chest pain: no Lower extremity edema: no Dizzy/lightheaded: no  DEPRESSION Mood status: stable Satisfied with current treatment?: yes Symptom severity: mild  Duration of current treatment : chronic Side effects: no Medication compliance: excellent compliance Psychotherapy/counseling: no  Previous psychiatric medications: wellbutrin Depressed mood: no Anxious mood: no Anhedonia: no Significant weight loss or gain: no Insomnia: no  Fatigue: yes Feelings of worthlessness or guilt: no Impaired concentration/indecisiveness: no Suicidal ideations: no Hopelessness: no Crying spells: no Depression screen [facility] 2/9 04/08/2021 02/18/2021 11/16/2020 05/22/2020 11/14/2019  Decreased Interest 0 0 0 0 1  Down, Depressed, Hopeless 0 0 1 0 1  PHQ - 2 Score 0 0 1 0 2  Altered sleeping 1 0 0 0 1  Tired, decreased energy 1 0 0 0 1  Change in appetite 0 0 0 0 1  Feeling bad or failure about yourself  0 0 0 0 0  Trouble concentrating 0 0 0 0 0  Moving slowly or fidgety/restless 0 0 0 0 0  Suicidal thoughts 0 0 0 0 0  PHQ-9 Score 2 0 1 0 5  Difficult doing work/chores Not difficult at all Not difficult at all - Not difficult at all Somewhat  difficult  Some recent data might be hidden   ANEMIA Anemia status: stable Etiology of anemia: Duration of anemia treatment:  Compliance with treatment: good compliance Iron supplementation side effects: no Severity of anemia: moderate Fatigue: yes Decreased exercise tolerance: no  Dyspnea on exertion: no Palpitations: no Bleeding: no Pica: no  Menopausal Symptoms: no  Depression Screen done today and results listed below:  Depression screen [facility] 2/9 04/08/2021 02/18/2021 11/16/2020 05/22/2020 11/14/2019  Decreased Interest 0 0 0 0 1  Down, Depressed, Hopeless 0 0 1 0 1  PHQ - 2 Score 0 0 1 0 2  Altered sleeping 1 0 0 0 1  Tired, decreased energy 1 0 0 0 1  Change in appetite 0 0 0 0 1  Feeling bad or failure about yourself  0 0 0 0 0  Trouble concentrating 0 0 0 0 0  Moving slowly or fidgety/restless 0 0 0 0 0  Suicidal thoughts 0 0 0 0 0  PHQ-9 Score 2 0 1 0 5  Difficult doing work/chores Not difficult at all Not difficult at all - Not difficult at all Somewhat difficult  Some recent data might be hidden    Past Medical History:  Past Medical History:  Diagnosis Date   Adenomyosis 11/15/2014   u/s suspicious for adenomyosis   Essential hypertension, benign 10/14/2012   GERD (gastroesophageal reflux disease)    Hypertension    Hypokalemia    IDA (iron deficiency  anemia)    Menorrhagia with irregular cycle    Pseudotumor cerebri dx 2007   Severe obesity [facility])     Surgical History:  Past Surgical History:  Procedure Laterality Date   TONSILLECTOMY  2007   TONSILLECTOMY  2007   TUBAL LIGATION  2002    Medications:  Current Outpatient Medications on File Prior to Visit  Medication Sig   cyclobenzaprine (FLEXERIL) 10 MG tablet Take 1 tablet (10 mg total) by mouth at bedtime. PRN   EPINEPHrine 0.3 mg/0.3 mL IJ SOAJ injection Inject 0.58mL into the muscle one time as needed for anaphylaxis   iron polysaccharides (NIFEREX) 150 MG capsule Take 150 mg by mouth daily.    ketorolac (TORADOL) 10 MG tablet Take 1 tablet (10 mg total) by mouth every 6 (six) hours as needed.   polyethylene glycol powder (GLYCOLAX/MIRALAX) 17 GM/SCOOP powder Take 17 g by mouth 2 (two) times daily as needed.   Probiotic Product (PROBIOTIC/PREBIOTIC/CRANBERRY) CAPS Take by mouth daily.   valACYclovir (VALTREX) 1000 MG tablet Take 2 tablets (2,000 mg total) by mouth 2 (two) times daily. For 1 day. Start as soon as you feel the symptoms starting PRN   No current facility-administered medications on file prior to visit.    Allergies:  Allergies  Allergen Reactions   Naproxen Sodium Other (See Comments)    On second dose of Aleve, pt presented to ED with severe urticaria and swelling of lips. Pt did not require Epi, but received remainder of allergic reaction treatment. --Lara Mulch, MD   Tylenol [Acetaminophen] Hives   Other Palpitations    FEREHEME- CHEST HEAVINESS AND ARM PAIN    Social History:  Social History   Socioeconomic History   Marital status: Single    Spouse name: Not on file   Number of children: 3   Years of education: BS   Highest education level: Not on file  Occupational History    Employer: [employer]  Tobacco Use   Smoking status: Never   Smokeless tobacco: Never  Vaping Use   Vaping Use: Never used  Substance and Sexual Activity   Alcohol use: Yes    Alcohol/week: 0.0 standard drinks    Comment: Rare/Occasional   Drug use: No   Sexual activity: Yes    Partners: Male    Birth control/protection: Surgical, None    Comment: BTL  Other Topics Concern   Not on file  Social History Narrative   2-3 caffeine drinks a week       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 No smoking; occasional alcohol.    Social Determinants of Health   Financial Resource Strain: Not on file  Food Insecurity: Not on file  Transportation Needs: Not on file  Physical Activity: Not on file  Stress: Not on file  Social Connections: Not on file   Intimate Partner Violence: Not on file   Social History   Tobacco Use  Smoking Status Never  Smokeless Tobacco Never   Social History   Substance and Sexual Activity  Alcohol Use Yes   Alcohol/week: 0.0 standard drinks   Comment: Rare/Occasional    Family History:  Family History  Problem Relation Age of Onset   Hypertension Mother    Hypercholesterolemia Mother    Thyroid disease Mother    Stroke Maternal Grandmother    Colon cancer Neg Hx    Breast cancer Neg Hx    Ovarian cancer Neg Hx    Heart disease Neg Hx  Past medical history, surgical history, medications, allergies, family history and social history reviewed with patient today and changes made to appropriate areas of the chart.   Review of Systems  Constitutional: Negative.   HENT: Negative.    Eyes: Negative.   Respiratory: Negative.    Cardiovascular:  Positive for leg swelling. Negative for chest pain, palpitations, orthopnea, claudication and PND.  Gastrointestinal:  Positive for constipation. Negative for abdominal pain, blood in stool, diarrhea, heartburn, melena, nausea and vomiting.  Genitourinary: Negative.   Musculoskeletal: Negative.   Skin: Negative.   Neurological: Negative.   Endo/Heme/Allergies: Negative.   Psychiatric/Behavioral: Negative.    All other ROS negative except what is listed above and in the HPI.      Objective:    BP 132/86   Pulse 74   Temp 99 F (37.2 C) (Oral)   Resp 16   Ht 5\' 8"  (1.727 m)   Wt 255 lb 9.6 oz (115.9 kg)   LMP 03/22/2021   SpO2 100%   BMI 38.86 kg/m   Wt Readings from Last 3 Encounters:  04/08/21 255 lb 9.6 oz (115.9 kg)  02/18/21 254 lb 3.2 oz (115.3 kg)  11/16/20 246 lb 3.2 oz (111.7 kg)    Physical Exam Vitals and nursing note reviewed.  Constitutional:      General: She is not in acute distress.    Appearance: Normal appearance. She is not ill-appearing, toxic-appearing or diaphoretic.  HENT:     Head: Normocephalic and  atraumatic.     Right Ear: Tympanic membrane, ear canal and external ear normal. There is no impacted cerumen.     Left Ear: Tympanic membrane, ear canal and external ear normal. There is no impacted cerumen.     Nose: Nose normal. No congestion or rhinorrhea.     Mouth/Throat:     Mouth: Mucous membranes are moist.     Pharynx: Oropharynx is clear. No oropharyngeal exudate or posterior oropharyngeal erythema.  Eyes:     General: No scleral icterus.       Right eye: No discharge.        Left eye: No discharge.     Extraocular Movements: Extraocular movements intact.     Conjunctiva/sclera: Conjunctivae normal.     Pupils: Pupils are equal, round, and reactive to light.  Neck:     Vascular: No carotid bruit.  Cardiovascular:     Rate and Rhythm: Normal rate and regular rhythm.     Pulses: Normal pulses.     Heart sounds: No murmur heard.   No friction rub. No gallop.  Pulmonary:     Effort: Pulmonary effort is normal. No respiratory distress.     Breath sounds: Normal breath sounds. No stridor. No wheezing, rhonchi or rales.  Chest:     Chest wall: No tenderness.  Abdominal:     General: Abdomen is flat. Bowel sounds are normal. There is no distension.     Palpations: Abdomen is soft. There is no mass.     Tenderness: There is no abdominal tenderness. There is no right CVA tenderness, left CVA tenderness, guarding or rebound.     Hernia: No hernia is present.  Genitourinary:    Comments: Breast and pelvic exams deferred with shared decision making Musculoskeletal:        General: No swelling, tenderness, deformity or signs of injury.     Cervical back: Normal range of motion and neck supple. No rigidity. No muscular tenderness.     Right lower  leg: No edema.     Left lower leg: No edema.  Lymphadenopathy:     Cervical: No cervical adenopathy.  Skin:    General: Skin is warm and dry.     Capillary Refill: Capillary refill takes less than 2 seconds.     Coloration: Skin is not  jaundiced or pale.     Findings: No bruising, erythema, lesion or rash.  Neurological:     General: No focal deficit present.     Mental Status: She is alert and oriented to person, place, and time. Mental status is at baseline.     Cranial Nerves: No cranial nerve deficit.     Sensory: No sensory deficit.     Motor: No weakness.     Coordination: Coordination normal.     Gait: Gait normal.     Deep Tendon Reflexes: Reflexes normal.  Psychiatric:        Mood and Affect: Mood normal.        Behavior: Behavior normal.        Thought Content: Thought content normal.        Judgment: Judgment normal.    Results for orders placed or performed in visit on 02/18/21  CBC with Differential/Platelet  Result Value Ref Range   WBC 6.0 3.4 - 10.8 x10E3/uL   RBC 4.34 3.77 - 5.28 x10E6/uL   Hemoglobin 9.4 (L) 11.1 - 15.9 g/dL   Hematocrit 83.1 (L) 51.7 - 46.6 %   MCV 74 (L) 79 - 97 fL   MCH 21.7 (L) 26.6 - 33.0 pg   MCHC 29.5 (L) 31.5 - 35.7 g/dL   RDW 61.6 (H) 07.3 - 71.0 %   Platelets 263 150 - 450 x10E3/uL   Neutrophils 65 Not Estab. %   Lymphs 28 Not Estab. %   Monocytes 6 Not Estab. %   Eos 1 Not Estab. %   Basos 0 Not Estab. %   Neutrophils Absolute 3.9 1.4 - 7.0 x10E3/uL   Lymphocytes Absolute 1.7 0.7 - 3.1 x10E3/uL   Monocytes Absolute 0.3 0.1 - 0.9 x10E3/uL   EOS (ABSOLUTE) 0.1 0.0 - 0.4 x10E3/uL   Basophils Absolute 0.0 0.0 - 0.2 x10E3/uL   Immature Granulocytes 0 Not Estab. %   Immature Grans (Abs) 0.0 0.0 - 0.1 x10E3/uL  Iron and TIBC  Result Value Ref Range   Total Iron Binding Capacity 328 250 - 450 ug/dL   UIBC 626 948 - 546 ug/dL   Iron 23 (L) 27 - 270 ug/dL   Iron Saturation 7 (LL) 15 - 55 %  Ferritin  Result Value Ref Range   Ferritin 22 15 - 150 ng/mL      Assessment & Plan:   Problem List Items Addressed This Visit       Cardiovascular and Mediastinum   HTN (hypertension)    Under good control on current regimen. Continue current regimen. Continue to  monitor. Call with any concerns. Refills given. Labs drawn today.       Relevant Medications   metoprolol succinate (TOPROL-XL) 25 MG 24 hr tablet   Other Relevant Orders   Comprehensive metabolic panel   Urinalysis, Routine w reflex microscopic   Microalbumin, Urine Waived     Other   Anemia    Rechecking labs today. Await results. Treat as needed.       Relevant Orders   CBC with Differential/Platelet   Comprehensive metabolic panel   Iron and TIBC   Ferritin   Iron deficiency anemia due  to chronic blood loss    Rechecking labs today. Await results. Treat as needed.       Depression, major, single episode, moderate ([facility])    Under good control on current regimen. Continue current regimen. Continue to monitor. Call with any concerns. Refills given. Labs drawn today.      Relevant Medications   buPROPion (WELLBUTRIN SR) 150 MG 12 hr tablet   Other Relevant Orders   Comprehensive metabolic panel   TSH   Other Visit Diagnoses     Routine general medical examination at a health care facility    -  Primary   Vaccines up to date. Screening labs checked today. Pap up to date. Mammogram ordered. Continue diet and exercise. Call with any concerns.    Relevant Orders   CBC with Differential/Platelet   Comprehensive metabolic panel   Lipid Panel w/o Chol/HDL Ratio   Urinalysis, Routine w reflex microscopic   TSH   Encounter for screening mammogram for malignant neoplasm of breast       Mammogram ordered today.   Relevant Orders   MM 3D SCREEN BREAST BILATERAL        Follow up plan: Return in about 6 months (around 10/06/2021).   LABORATORY TESTING:  - Pap smear: up to date  IMMUNIZATIONS:   - Tdap: Tetanus vaccination status reviewed: last tetanus booster within 10 years. - Influenza: Administered today - Pneumovax: Not applicable - Prevnar: Not applicable - HPV: Up to date - COVID vaccine: Up to date  SCREENING: -Mammogram: Ordered today  - Colonoscopy: Not  applicable   PATIENT COUNSELING:   Advised to take 1 mg of folate supplement per day if capable of pregnancy.   Sexuality: Discussed sexually transmitted diseases, partner selection, use of condoms, avoidance of unintended pregnancy  and contraceptive alternatives.   Advised to avoid cigarette smoking.  I discussed with the patient that most people either abstain from alcohol or drink within safe limits (<=14/week and <=4 drinks/occasion for males, <=7/weeks and <= 3 drinks/occasion for females) and that the risk for alcohol disorders and other health effects rises proportionally with the number of drinks per week and how often a drinker exceeds daily limits.  Discussed cessation/primary prevention of drug use and availability of treatment for abuse.   Diet: Encouraged to adjust caloric intake to maintain  or achieve ideal body weight, to reduce intake of dietary saturated fat and total fat, to limit sodium intake by avoiding high sodium foods and not adding table salt, and to maintain adequate dietary potassium and calcium preferably from fresh fruits, vegetables, and low-fat dairy products.    stressed the importance of regular exercise  Injury prevention: Discussed safety belts, safety helmets, smoke detector, smoking near bedding or upholstery.   Dental health: Discussed importance of regular tooth brushing, flossing, and dental visits.    NEXT PREVENTATIVE PHYSICAL DUE IN 1 YEAR. Return in about 6 months (around 10/06/2021).

## 2021-04-08 NOTE — Assessment & Plan Note (Signed)
Under good control on current regimen. Continue current regimen. Continue to monitor. Call with any concerns. Refills given. Labs drawn today.   

## 2021-04-09 LAB — CBC WITH DIFFERENTIAL/PLATELET
Basophils Absolute: 0 10*3/uL (ref 0.0–0.2)
Basos: 0 %
EOS (ABSOLUTE): 0.1 10*3/uL (ref 0.0–0.4)
Eos: 1 %
Hematocrit: 33.9 % — ABNORMAL LOW (ref 34.0–46.6)
Hemoglobin: 10.4 g/dL — ABNORMAL LOW (ref 11.1–15.9)
Immature Grans (Abs): 0 10*3/uL (ref 0.0–0.1)
Immature Granulocytes: 0 %
Lymphocytes Absolute: 1.8 10*3/uL (ref 0.7–3.1)
Lymphs: 27 %
MCH: 22.3 pg — ABNORMAL LOW (ref 26.6–33.0)
MCHC: 30.7 g/dL — ABNORMAL LOW (ref 31.5–35.7)
MCV: 73 fL — ABNORMAL LOW (ref 79–97)
Monocytes Absolute: 0.3 10*3/uL (ref 0.1–0.9)
Monocytes: 4 %
Neutrophils Absolute: 4.7 10*3/uL (ref 1.4–7.0)
Neutrophils: 68 %
Platelets: 276 10*3/uL (ref 150–450)
RBC: 4.66 x10E6/uL (ref 3.77–5.28)
RDW: 18.1 % — ABNORMAL HIGH (ref 11.7–15.4)
WBC: 6.9 10*3/uL (ref 3.4–10.8)

## 2021-04-09 LAB — COMPREHENSIVE METABOLIC PANEL
ALT: 7 IU/L (ref 0–32)
AST: 14 IU/L (ref 0–40)
Albumin/Globulin Ratio: 1.4 (ref 1.2–2.2)
Albumin: 4.2 g/dL (ref 3.8–4.8)
Alkaline Phosphatase: 89 IU/L (ref 44–121)
BUN/Creatinine Ratio: 9 (ref 9–23)
BUN: 8 mg/dL (ref 6–24)
Bilirubin Total: 0.5 mg/dL (ref 0.0–1.2)
CO2: 20 mmol/L (ref 20–29)
Calcium: 8.9 mg/dL (ref 8.7–10.2)
Chloride: 103 mmol/L (ref 96–106)
Creatinine, Ser: 0.89 mg/dL (ref 0.57–1.00)
Globulin, Total: 3.1 g/dL (ref 1.5–4.5)
Glucose: 84 mg/dL (ref 65–99)
Potassium: 3.6 mmol/L (ref 3.5–5.2)
Sodium: 139 mmol/L (ref 134–144)
Total Protein: 7.3 g/dL (ref 6.0–8.5)
eGFR: 82 mL/min/{1.73_m2} (ref >59)

## 2021-04-09 LAB — LIPID PANEL W/O CHOL/HDL RATIO
Cholesterol, Total: 167 mg/dL (ref 100–199)
HDL: 59 mg/dL (ref >39)
LDL Chol Calc (NIH): 94 mg/dL (ref 0–99)
Triglycerides: 76 mg/dL (ref 0–149)
VLDL Cholesterol Cal: 14 mg/dL (ref 5–40)

## 2021-04-09 LAB — IRON AND TIBC
Iron Saturation: 14 % — ABNORMAL LOW (ref 15–55)
Iron: 47 ug/dL (ref 27–159)
Total Iron Binding Capacity: 334 ug/dL (ref 250–450)
UIBC: 287 ug/dL (ref 131–425)

## 2021-04-09 LAB — TSH: TSH: 1.61 u[IU]/mL (ref 0.450–4.500)

## 2021-04-09 LAB — FERRITIN: Ferritin: 19 ng/mL (ref 15–150)

## 2021-04-18 ENCOUNTER — Telehealth: Payer: Self-pay

## 2021-04-18 DIAGNOSIS — N644 Mastodynia: Secondary | ICD-10-CM

## 2021-04-18 NOTE — Telephone Encounter (Signed)
Copied from CRM [id number]. Topic: General - Other >> Apr 18, 2021 12:06 PM Gwenlyn Fudge wrote: Reason for CRM: Lurena Joiner, from [facility] radiology, calling in regards to the orders for a mammogram. She states that after speaking to the pt she is actually needing to have a bilateral diagnostic mammogram and bilateral breast ultrasound. Please advise.

## 2021-04-19 NOTE — Telephone Encounter (Signed)
Orders in. Please fax to [facility]

## 2021-04-22 ENCOUNTER — Encounter: Payer: Self-pay | Admitting: Family Medicine

## 2021-04-26 ENCOUNTER — Telehealth: Payer: Self-pay

## 2021-04-26 ENCOUNTER — Encounter: Payer: Self-pay | Admitting: Family Medicine

## 2021-04-26 NOTE — Telephone Encounter (Signed)
Orders for Mammogram diagnostic and U/S Bilateral were faxed to [facility] in [facility].

## 2021-05-22 LAB — HM MAMMOGRAPHY

## 2021-05-24 ENCOUNTER — Other Ambulatory Visit: Payer: Self-pay

## 2021-05-24 ENCOUNTER — Encounter: Payer: Self-pay | Admitting: Family Medicine

## 2021-05-24 ENCOUNTER — Ambulatory Visit: Payer: No Typology Code available for payment source | Admitting: Family Medicine

## 2021-05-24 VITALS — BP 127/87 | HR 72 | Temp 98.1°F | Wt 260.2 lb

## 2021-05-24 DIAGNOSIS — F321 Major depressive disorder, single episode, moderate: Secondary | ICD-10-CM

## 2021-05-24 DIAGNOSIS — R21 Rash and other nonspecific skin eruption: Secondary | ICD-10-CM

## 2021-05-24 MED ORDER — BUPROPION HCL ER (XL) 300 MG PO TB24
300.0000 mg | ORAL_TABLET | Freq: Every day | ORAL | 1 refills | Status: DC
  Filled 2021-12-30: qty 30, 30d supply, fill #0
  Filled 2022-02-03: qty 30, 30d supply, fill #1

## 2021-05-24 MED ORDER — CLOTRIMAZOLE-BETAMETHASONE 1-0.05 % EX CREA: 1.0000 "application " | TOPICAL_CREAM | Freq: Every day | CUTANEOUS | 0 refills | Status: DC

## 2021-05-24 NOTE — Progress Notes (Signed)
BP 127/87   Pulse 72   Temp 98.1 F (36.7 C)   Wt 260 lb 3.2 oz (118 kg)   SpO2 99%   BMI 39.56 kg/m    Subjective:    Patient ID: Rachel Salas, female    DOB: 1975-09-05, 45 y.o.   MRN: [id number]  HPI: Rachel Salas is a 45 y.o. female  Chief Complaint  Patient presents with   Rash    Patient states she has a rash on her right leg for several months, its now showing on her left leg. Patient states at times it is itchy, and irritated   RASH Duration:  months  Location: legs  Itching: yes- occasionally Burning: no Redness: no Oozing: no Scaling: yes Blisters: no Painful: no Fevers: no Change in detergents/soaps/personal care products: no Recent illness: no Recent travel:no History of same: no Context: stable Alleviating factors: lotion/moisturizer Treatments attempted:lotion/moisturizer Shortness of breath: no  Throat/tongue swelling: no Myalgias/arthralgias: no  WEIGHT GAIN Duration: chronic Previous attempts at weight loss: yes Complications of obesity: HTN, GERD Peak weight: current (260) Weight loss goal: to be healthy Weight loss to date: none Requesting obesity pharmacotherapy: yes Current weight loss supplements/medications: no Previous weight loss supplements/meds: yes- contrave  DEPRESSION Mood status: stable Satisfied with current treatment?: yes Symptom severity: mild  Duration of current treatment : chronic Side effects: no Medication compliance: excellent compliance Psychotherapy/counseling: no  Previous psychiatric medications: wellbutrin Depressed mood: yes Anxious mood: yes Anhedonia: no Significant weight loss or gain: no Insomnia: no  Fatigue: yes Feelings of worthlessness or guilt: no Impaired concentration/indecisiveness: no Suicidal ideations: no Hopelessness: no Crying spells: no Depression screen [facility] 2/9 04/08/2021 02/18/2021 11/16/2020 05/22/2020 11/14/2019  Decreased Interest 0 0 0 0 1  Down, Depressed, Hopeless 0 0 1 0  1  PHQ - 2 Score 0 0 1 0 2  Altered sleeping 1 0 0 0 1  Tired, decreased energy 1 0 0 0 1  Change in appetite 0 0 0 0 1  Feeling bad or failure about yourself  0 0 0 0 0  Trouble concentrating 0 0 0 0 0  Moving slowly or fidgety/restless 0 0 0 0 0  Suicidal thoughts 0 0 0 0 0  PHQ-9 Score 2 0 1 0 5  Difficult doing work/chores Not difficult at all Not difficult at all - Not difficult at all Somewhat difficult  Some recent data might be hidden     Relevant past medical, surgical, family and social history reviewed and updated as indicated. Interim medical history since our last visit reviewed. Allergies and medications reviewed and updated.  Review of Systems  Constitutional: Negative.   Respiratory: Negative.    Cardiovascular: Negative.   Gastrointestinal: Negative.   Musculoskeletal: Negative.   Skin:  Positive for rash. Negative for color change, pallor and wound.  Psychiatric/Behavioral: Negative.     Per HPI unless specifically indicated above     Objective:    BP 127/87   Pulse 72   Temp 98.1 F (36.7 C)   Wt 260 lb 3.2 oz (118 kg)   SpO2 99%   BMI 39.56 kg/m   Wt Readings from Last 3 Encounters:  05/24/21 260 lb 3.2 oz (118 kg)  04/08/21 255 lb 9.6 oz (115.9 kg)  02/18/21 254 lb 3.2 oz (115.3 kg)    Physical Exam Vitals and nursing note reviewed.  Constitutional:      General: She is not in acute distress.    Appearance:  Normal appearance. She is not ill-appearing, toxic-appearing or diaphoretic.  HENT:     Head: Normocephalic and atraumatic.     Right Ear: External ear normal.     Left Ear: External ear normal.     Nose: Nose normal.     Mouth/Throat:     Mouth: Mucous membranes are moist.     Pharynx: Oropharynx is clear.  Eyes:     General: No scleral icterus.       Right eye: No discharge.        Left eye: No discharge.     Extraocular Movements: Extraocular movements intact.     Conjunctiva/sclera: Conjunctivae normal.     Pupils: Pupils are  equal, round, and reactive to light.  Cardiovascular:     Rate and Rhythm: Normal rate and regular rhythm.     Pulses: Normal pulses.     Heart sounds: Normal heart sounds. No murmur heard.   No friction rub. No gallop.  Pulmonary:     Effort: Pulmonary effort is normal. No respiratory distress.     Breath sounds: Normal breath sounds. No stridor. No wheezing, rhonchi or rales.  Chest:     Chest wall: No tenderness.  Musculoskeletal:        General: Normal range of motion.     Cervical back: Normal range of motion and neck supple.  Skin:    General: Skin is warm and dry.     Capillary Refill: Capillary refill takes less than 2 seconds.     Coloration: Skin is not jaundiced or pale.     Findings: No bruising, erythema, lesion or rash.     Comments: Flat hyperpigmented patches on her legs R>L  Neurological:     General: No focal deficit present.     Mental Status: She is alert and oriented to person, place, and time. Mental status is at baseline.  Psychiatric:        Mood and Affect: Mood normal.        Behavior: Behavior normal.        Thought Content: Thought content normal.        Judgment: Judgment normal.    Results for orders placed or performed in visit on 04/08/21  Microscopic Examination   Urine  Result Value Ref Range   WBC, UA None seen 0 - 5 /hpf   RBC 0-2 0 - 2 /hpf   Epithelial Cells (non renal) 0-10 0 - 10 /hpf   Bacteria, UA Few (A) None seen/Few  CBC with Differential/Platelet  Result Value Ref Range   WBC 6.9 3.4 - 10.8 x10E3/uL   RBC 4.66 3.77 - 5.28 x10E6/uL   Hemoglobin 10.4 (L) 11.1 - 15.9 g/dL   Hematocrit 33.9 (L) 34.0 - 46.6 %   MCV 73 (L) 79 - 97 fL   MCH 22.3 (L) 26.6 - 33.0 pg   MCHC 30.7 (L) 31.5 - 35.7 g/dL   RDW 18.1 (H) 11.7 - 15.4 %   Platelets 276 150 - 450 x10E3/uL   Neutrophils 68 Not Estab. %   Lymphs 27 Not Estab. %   Monocytes 4 Not Estab. %   Eos 1 Not Estab. %   Basos 0 Not Estab. %   Neutrophils Absolute 4.7 1.4 - 7.0  x10E3/uL   Lymphocytes Absolute 1.8 0.7 - 3.1 x10E3/uL   Monocytes Absolute 0.3 0.1 - 0.9 x10E3/uL   EOS (ABSOLUTE) 0.1 0.0 - 0.4 x10E3/uL   Basophils Absolute 0.0 0.0 - 0.2 x10E3/uL  Immature Granulocytes 0 Not Estab. %   Immature Grans (Abs) 0.0 0.0 - 0.1 x10E3/uL  Comprehensive metabolic panel  Result Value Ref Range   Glucose 84 65 - 99 mg/dL   BUN 8 6 - 24 mg/dL   Creatinine, Ser 0.89 0.57 - 1.00 mg/dL   eGFR 82 >59 mL/min/1.73   BUN/Creatinine Ratio 9 9 - 23   Sodium 139 134 - 144 mmol/L   Potassium 3.6 3.5 - 5.2 mmol/L   Chloride 103 96 - 106 mmol/L   CO2 20 20 - 29 mmol/L   Calcium 8.9 8.7 - 10.2 mg/dL   Total Protein 7.3 6.0 - 8.5 g/dL   Albumin 4.2 3.8 - 4.8 g/dL   Globulin, Total 3.1 1.5 - 4.5 g/dL   Albumin/Globulin Ratio 1.4 1.2 - 2.2   Bilirubin Total 0.5 0.0 - 1.2 mg/dL   Alkaline Phosphatase 89 44 - 121 IU/L   AST 14 0 - 40 IU/L   ALT 7 0 - 32 IU/L  Lipid Panel w/o Chol/HDL Ratio  Result Value Ref Range   Cholesterol, Total 167 100 - 199 mg/dL   Triglycerides 76 0 - 149 mg/dL   HDL 59 >39 mg/dL   VLDL Cholesterol Cal 14 5 - 40 mg/dL   LDL Chol Calc (NIH) 94 0 - 99 mg/dL  Urinalysis, Routine w reflex microscopic  Result Value Ref Range   Specific Gravity, UA >1.030 (H) 1.005 - 1.030   pH, UA 6.0 5.0 - 7.5   Color, UA Yellow Yellow   Appearance Ur Clear Clear   Leukocytes,UA Negative Negative   Protein,UA Trace (A) Negative/Trace   Glucose, UA Negative Negative   Ketones, UA Negative Negative   RBC, UA Trace (A) Negative   Bilirubin, UA Negative Negative   Urobilinogen, Ur 0.2 0.2 - 1.0 mg/dL   Nitrite, UA Negative Negative   Microscopic Examination See below:   TSH  Result Value Ref Range   TSH 1.610 0.450 - 4.500 uIU/mL  Microalbumin, Urine Waived  Result Value Ref Range   Microalb, Ur Waived 30 (H) 0 - 19 mg/L   Creatinine, Urine Waived 300 10 - 300 mg/dL   Microalb/Creat Ratio <30 <30 mg/g  Iron and TIBC  Result Value Ref Range   Total  Iron Binding Capacity 334 250 - 450 ug/dL   UIBC 287 131 - 425 ug/dL   Iron 47 27 - 159 ug/dL   Iron Saturation 14 (L) 15 - 55 %  Ferritin  Result Value Ref Range   Ferritin 19 15 - 150 ng/mL      Assessment & Plan:   Problem List Items Addressed This Visit       Other   Morbid obesity ([place])    Will increase her wellbutrin as her contrave is not covered. Continue to monitor. Call with any concerns.       Depression, major, single episode, moderate (HCC)    Will increase her wellbutrin to help with weight and mood. Continue to monitor. Call with any concerns.       Relevant Medications   buPROPion (WELLBUTRIN XL) 300 MG 24 hr tablet   Other Visit Diagnoses     Rash    -  Primary   Of unclear etiology. Will treat with lotrisone- if not getting better in 1 month, will do punch biopsy.         Follow up plan: Return in about 4 weeks (around 06/21/2021) for punch biopsy (40 min).

## 2021-05-25 NOTE — Assessment & Plan Note (Signed)
Will increase her wellbutrin as her contrave is not covered. Continue to monitor. Call with any concerns.

## 2021-05-25 NOTE — Assessment & Plan Note (Signed)
Will increase her wellbutrin to help with weight and mood. Continue to monitor. Call with any concerns.

## 2021-06-24 ENCOUNTER — Ambulatory Visit: Payer: No Typology Code available for payment source | Admitting: Family Medicine

## 2021-06-27 NOTE — Progress Notes (Signed)
Pt never returned calls cancelled all her appts here.

## 2021-09-25 ENCOUNTER — Encounter: Payer: Self-pay | Admitting: Internal Medicine

## 2021-09-25 ENCOUNTER — Telehealth: Payer: PRIVATE HEALTH INSURANCE | Admitting: Physician Assistant

## 2021-09-25 DIAGNOSIS — N760 Acute vaginitis: Secondary | ICD-10-CM

## 2021-09-25 MED ORDER — FLUCONAZOLE 150 MG PO TABS: 150.0000 mg | ORAL_TABLET | Freq: Once | ORAL | 0 refills | Status: AC

## 2021-09-25 NOTE — Progress Notes (Signed)

## 2021-10-10 ENCOUNTER — Other Ambulatory Visit: Payer: Self-pay

## 2021-10-10 ENCOUNTER — Encounter: Payer: Self-pay | Admitting: Family Medicine

## 2021-10-10 ENCOUNTER — Other Ambulatory Visit (HOSPITAL_COMMUNITY)
Admission: RE | Admit: 2021-10-10 | Discharge: 2021-10-10 | Disposition: A | Discharge status: Home / Self Care | Payer: PRIVATE HEALTH INSURANCE | Source: Ambulatory Visit | Attending: Family Medicine | Admitting: Family Medicine

## 2021-10-10 ENCOUNTER — Ambulatory Visit: Payer: PRIVATE HEALTH INSURANCE | Admitting: Family Medicine

## 2021-10-10 VITALS — BP 139/89 | HR 83 | Temp 98.2°F | Wt 258.6 lb

## 2021-10-10 DIAGNOSIS — R21 Rash and other nonspecific skin eruption: Secondary | ICD-10-CM | POA: Diagnosis not present

## 2021-10-10 DIAGNOSIS — I776 Arteritis, unspecified: Secondary | ICD-10-CM

## 2021-10-10 LAB — URINALYSIS, ROUTINE W REFLEX MICROSCOPIC
Bilirubin, UA: NEGATIVE
Glucose, UA: NEGATIVE
Leukocytes,UA: NEGATIVE
Nitrite, UA: NEGATIVE
Protein,UA: NEGATIVE
Specific Gravity, UA: >1.03 — ABNORMAL HIGH (ref 1.005–1.030)
Urobilinogen, Ur: 1 mg/dL (ref 0.2–1.0)
pH, UA: 6 (ref 5.0–7.5)

## 2021-10-10 NOTE — Progress Notes (Signed)
? ?BP 139/89   Pulse 83   Temp 98.2 ?F (36.8 ?C)   Wt 258 lb 9.6 oz (117.3 kg)   SpO2 99%   BMI 39.32 kg/m?   ? ?Subjective:  ? ? Patient ID: Rachel Salas, female    DOB: Mar 27, 1976, 46 y.o.   MRN: [id number] ? ?HPI: ?Rachel Salas is a 46 y.o. female ? ?Chief Complaint  ?Patient presents with  ? Rash  ?  Patient here for punch biopsy, has several on legs.   ? ?RASH ?Duration:  months- had been getting better, but then started to pop up again ?Location: legs  ?Itching: no ?Burning: no ?Redness: no ?Oozing: no ?Scaling: no ?Blisters: no ?Painful: no ?Fevers: no ?Change in detergents/soaps/personal care products: no ?Recent illness: no ?Recent travel:no ?History of same: yes ?Context: fluctuating ?Alleviating factors: hydrocortisone cream and lotion/moisturizer ?Treatments attempted:hydrocortisone cream, benadryl, OTC anit-fungal, and lotion/moisturizer ?Shortness of breath: no  ?Throat/tongue swelling: no ?Myalgias/arthralgias: no ? ?Relevant past medical, surgical, family and social history reviewed and updated as indicated. Interim medical history since our last visit reviewed. ?Allergies and medications reviewed and updated. ? ?Review of Systems  ?Constitutional: Negative.   ?Respiratory: Negative.    ?Cardiovascular: Negative.   ?Gastrointestinal: Negative.   ?Musculoskeletal: Negative.   ?Skin:  Positive for rash. Negative for color change, pallor and wound.  ?Neurological: Negative.   ?Psychiatric/Behavioral: Negative.    ? ?Per HPI unless specifically indicated above ? ?   ?Objective:  ?  ?BP 139/89   Pulse 83   Temp 98.2 ?F (36.8 ?C)   Wt 258 lb 9.6 oz (117.3 kg)   SpO2 99%   BMI 39.32 kg/m?   ?Wt Readings from Last 3 Encounters:  ?10/10/21 258 lb 9.6 oz (117.3 kg)  ?05/24/21 260 lb 3.2 oz (118 kg)  ?04/08/21 255 lb 9.6 oz (115.9 kg)  ?  ?Physical Exam ?Vitals and nursing note reviewed.  ?Constitutional:   ?   General: She is not in acute distress. ?   Appearance: Normal appearance. She is not  ill-appearing, toxic-appearing or diaphoretic.  ?HENT:  ?   Head: Normocephalic and atraumatic.  ?   Right Ear: External ear normal.  ?   Left Ear: External ear normal.  ?   Nose: Nose normal.  ?   Mouth/Throat:  ?   Mouth: Mucous membranes are moist.  ?   Pharynx: Oropharynx is clear.  ?Eyes:  ?   General: No scleral icterus.    ?   Right eye: No discharge.     ?   Left eye: No discharge.  ?   Extraocular Movements: Extraocular movements intact.  ?   Conjunctiva/sclera: Conjunctivae normal.  ?   Pupils: Pupils are equal, round, and reactive to light.  ?Cardiovascular:  ?   Rate and Rhythm: Normal rate and regular rhythm.  ?   Pulses: Normal pulses.  ?   Heart sounds: Normal heart sounds. No murmur heard. ?  No friction rub. No gallop.  ?Pulmonary:  ?   Effort: Pulmonary effort is normal. No respiratory distress.  ?   Breath sounds: Normal breath sounds. No stridor. No wheezing, rhonchi or rales.  ?Chest:  ?   Chest wall: No tenderness.  ?Musculoskeletal:     ?   General: Normal range of motion.  ?   Cervical back: Normal range of motion and neck supple.  ?Skin: ?   General: Skin is warm and dry.  ?   Capillary Refill: Capillary  refill takes less than 2 seconds.  ?   Coloration: Skin is not jaundiced or pale.  ?   Findings: No bruising, erythema, lesion or rash.  ?   Comments: Flat purpuric rash on legs, non-blanching  ?Neurological:  ?   General: No focal deficit present.  ?   Mental Status: She is alert and oriented to person, place, and time. Mental status is at baseline.  ?Psychiatric:     ?   Mood and Affect: Mood normal.     ?   Behavior: Behavior normal.     ?   Thought Content: Thought content normal.     ?   Judgment: Judgment normal.  ? ? ?Results for orders placed or performed in visit on 05/27/21  ?HM MAMMOGRAPHY  ?Result Value Ref Range  ? HM Mammogram 0-4 Bi-Rad 0-4 Bi-Rad, Self Reported Normal  ? ?   ?Assessment & Plan:  ? ?Problem List Items Addressed This Visit   ?None ?Visit Diagnoses   ? ?  Vasculitis ([place])    -  Primary  ? Will check labs and await punch biopsy results. Treat as needed.   ? Relevant Orders  ? Surgical pathology  ? ANCA Profile  ? Complement, total  ? C-reactive protein  ? Sed Rate (ESR)  ? Antinuclear Antib (ANA)  ? Urinalysis, Routine w reflex microscopic  ? Comprehensive metabolic panel  ? IgA  ? Rheumatoid factor  ? ANA+ENA+DNA/DS+Antich+Centr  ? CK  ? CBC with Differential/Platelet  ? Hepatitis C Antibody  ? ?  ?  ?Skin Procedure ? ?Procedure: Informed consent given.  Sterile prep of the area.  Area infiltrated with lidocaine without epinephrine.  Using punch biopsy, a piece of the rash was removed and sent  for pathology. Pt ed on scarring. ? ?Diagnosis: ?  ICD-10-CM   ?1. Vasculitis ([place])  I77.6 Surgical pathology  ?  ANCA Profile  ?  Complement, total  ?  C-reactive protein  ?  Sed Rate (ESR)  ?  Antinuclear Antib (ANA)  ?  Urinalysis, Routine w reflex microscopic  ?  Comprehensive metabolic panel  ?  IgA  ?  Rheumatoid factor  ?  ANA+ENA+DNA/DS+Antich+Centr  ?  CK  ?  CBC with Differential/Platelet  ?  Hepatitis C Antibody  ? Will check labs and await punch biopsy results. Treat as needed.   ?  ? ? ?Lesion Location/Size: 2 inch flat purpuric rash on lateral side of R leg ?Physician: MJ ?Consent:  Risks, benefits, and alternative treatments discussed and all questions were answered.  Patient elected to proceed and verbal consent obtained.  ?Description: Area prepped and draped using semi-sterile technique. Area locally anesthetized using 5 cc's of lidocaine 1% plain. Lesion biopsied using a punch biopsy size 18m .  Adequate hemostastis achieved using  wound closure with sutures.  Biopsy site closed using  3  stitches of 4-0 ethilon monofilament. Wound dressed after application of bacitracin ointment. Area dressed. ?Post Procedure Instructions:  Wound care instructions discussed and patient was instructed to keep area clean and dry.  Signs and symptoms of infection discussed,  patient agrees to contact the office ASAP should they occur.  Dressing change recommended every other day.  Signs and symptoms of infection discussed and patient encouraged to contact the clinic ASAP for concerns. Suture removal in 7 days. ? ? ?Follow up plan: ?Return 1 week for suture removal (OK to double book- does not need full appt), otherwise May follow up. ? ? ? ? ? ?

## 2021-10-14 LAB — SURGICAL PATHOLOGY

## 2021-10-15 ENCOUNTER — Other Ambulatory Visit: Payer: Self-pay | Admitting: Family Medicine

## 2021-10-15 DIAGNOSIS — I776 Arteritis, unspecified: Secondary | ICD-10-CM

## 2021-10-15 DIAGNOSIS — R7689 Other specified abnormal immunological findings in serum: Secondary | ICD-10-CM

## 2021-10-15 DIAGNOSIS — R768 Other specified abnormal immunological findings in serum: Secondary | ICD-10-CM

## 2021-10-15 DIAGNOSIS — R899 Unspecified abnormal finding in specimens from other organs, systems and tissues: Secondary | ICD-10-CM

## 2021-10-15 LAB — IGA: IgA/Immunoglobulin A, Serum: 281 mg/dL (ref 87–352)

## 2021-10-15 LAB — FANA STAINING PATTERNS: Homogeneous Pattern: 1:640 {titer} — ABNORMAL HIGH

## 2021-10-15 LAB — CBC WITH DIFFERENTIAL/PLATELET
Basophils Absolute: 0 10*3/uL (ref 0.0–0.2)
Basos: 1 %
EOS (ABSOLUTE): 0.1 10*3/uL (ref 0.0–0.4)
Eos: 2 %
Hematocrit: 34 % (ref 34.0–46.6)
Hemoglobin: 10.6 g/dL — ABNORMAL LOW (ref 11.1–15.9)
Immature Grans (Abs): 0 10*3/uL (ref 0.0–0.1)
Immature Granulocytes: 0 %
Lymphocytes Absolute: 1.9 10*3/uL (ref 0.7–3.1)
Lymphs: 29 %
MCH: 23.3 pg — ABNORMAL LOW (ref 26.6–33.0)
MCHC: 31.2 g/dL — ABNORMAL LOW (ref 31.5–35.7)
MCV: 75 fL — ABNORMAL LOW (ref 79–97)
Monocytes Absolute: 0.4 10*3/uL (ref 0.1–0.9)
Monocytes: 5 %
Neutrophils Absolute: 4.1 10*3/uL (ref 1.4–7.0)
Neutrophils: 63 %
Platelets: 277 10*3/uL (ref 150–450)
RBC: 4.54 x10E6/uL (ref 3.77–5.28)
RDW: 18 % — ABNORMAL HIGH (ref 11.7–15.4)
WBC: 6.5 10*3/uL (ref 3.4–10.8)

## 2021-10-15 LAB — COMPREHENSIVE METABOLIC PANEL
ALT: 8 IU/L (ref 0–32)
AST: 15 IU/L (ref 0–40)
Albumin/Globulin Ratio: 1.2 (ref 1.2–2.2)
Albumin: 4.1 g/dL (ref 3.8–4.8)
Alkaline Phosphatase: 85 IU/L (ref 44–121)
BUN/Creatinine Ratio: 11 (ref 9–23)
BUN: 11 mg/dL (ref 6–24)
Bilirubin Total: 0.3 mg/dL (ref 0.0–1.2)
CO2: 21 mmol/L (ref 20–29)
Calcium: 8.9 mg/dL (ref 8.7–10.2)
Chloride: 106 mmol/L (ref 96–106)
Creatinine, Ser: 1.01 mg/dL — ABNORMAL HIGH (ref 0.57–1.00)
Globulin, Total: 3.5 g/dL (ref 1.5–4.5)
Glucose: 100 mg/dL — ABNORMAL HIGH (ref 70–99)
Potassium: 4.1 mmol/L (ref 3.5–5.2)
Sodium: 139 mmol/L (ref 134–144)
Total Protein: 7.6 g/dL (ref 6.0–8.5)
eGFR: 71 mL/min/{1.73_m2} (ref >59)

## 2021-10-15 LAB — ANA+ENA+DNA/DS+ANTICH+CENTR
ANA Titer 1: POSITIVE — AB
Anti JO-1: <0.2 AI (ref 0.0–0.9)
Centromere Ab Screen: <0.2 AI (ref 0.0–0.9)
Chromatin Ab SerPl-aCnc: 1.6 AI — ABNORMAL HIGH (ref 0.0–0.9)
ENA RNP Ab: >8 AI — ABNORMAL HIGH (ref 0.0–0.9)
ENA SM Ab Ser-aCnc: <0.2 AI (ref 0.0–0.9)
ENA SSA (RO) Ab: <0.2 AI (ref 0.0–0.9)
ENA SSB (LA) Ab: 0.2 AI (ref 0.0–0.9)
Scleroderma (Scl-70) (ENA) Antibody, IgG: <0.2 AI (ref 0.0–0.9)
dsDNA Ab: 1 IU/mL (ref 0–9)

## 2021-10-15 LAB — ANCA PROFILE
Anti-MPO Antibodies: <0.2 units (ref 0.0–0.9)
Anti-PR3 Antibodies: <0.2 units (ref 0.0–0.9)
Atypical pANCA: <1:20 {titer}
C-ANCA: <1:20 {titer}
P-ANCA: <1:20 {titer}

## 2021-10-15 LAB — COMPLEMENT, TOTAL: Compl, Total (CH50): >60 U/mL (ref >41)

## 2021-10-15 LAB — C-REACTIVE PROTEIN: CRP: 20 mg/L — ABNORMAL HIGH (ref 0–10)

## 2021-10-15 LAB — SEDIMENTATION RATE: Sed Rate: 57 mm/hr — ABNORMAL HIGH (ref 0–32)

## 2021-10-15 LAB — ANA: Anti Nuclear Antibody (ANA): POSITIVE — AB

## 2021-10-15 LAB — CK: Total CK: 63 U/L (ref 32–182)

## 2021-10-15 LAB — HEPATITIS C ANTIBODY: Hep C Virus Ab: NONREACTIVE

## 2021-10-15 LAB — RHEUMATOID FACTOR: Rheumatoid fact SerPl-aCnc: <10 IU/mL (ref <14.0)

## 2021-10-16 ENCOUNTER — Encounter: Payer: Self-pay | Admitting: Family Medicine

## 2021-10-16 ENCOUNTER — Ambulatory Visit (INDEPENDENT_AMBULATORY_CARE_PROVIDER_SITE_OTHER): Payer: PRIVATE HEALTH INSURANCE | Admitting: Family Medicine

## 2021-10-16 ENCOUNTER — Other Ambulatory Visit: Payer: Self-pay

## 2021-10-16 VITALS — BP 139/87 | HR 90 | Temp 98.2°F | Wt 258.0 lb

## 2021-10-16 DIAGNOSIS — Z4802 Encounter for removal of sutures: Secondary | ICD-10-CM

## 2021-10-16 NOTE — Progress Notes (Signed)
1 suture removed, other skin not well approximated below that- will leave sutures in and have her come back in 4-5 days ?

## 2021-10-21 ENCOUNTER — Ambulatory Visit (INDEPENDENT_AMBULATORY_CARE_PROVIDER_SITE_OTHER): Payer: PRIVATE HEALTH INSURANCE | Admitting: Family Medicine

## 2021-10-21 VITALS — BP 147/93 | HR 80 | Temp 98.4°F | Wt 258.0 lb

## 2021-10-21 DIAGNOSIS — Z4802 Encounter for removal of sutures: Secondary | ICD-10-CM

## 2021-10-21 NOTE — Progress Notes (Signed)
Wound well approximated. 2 sutures removed today without issues.  ?

## 2021-10-23 ENCOUNTER — Other Ambulatory Visit: Payer: Self-pay | Admitting: Family Medicine

## 2021-10-23 DIAGNOSIS — R768 Other specified abnormal immunological findings in serum: Secondary | ICD-10-CM

## 2021-10-23 DIAGNOSIS — R7689 Other specified abnormal immunological findings in serum: Secondary | ICD-10-CM

## 2021-10-23 DIAGNOSIS — R899 Unspecified abnormal finding in specimens from other organs, systems and tissues: Secondary | ICD-10-CM

## 2021-10-23 DIAGNOSIS — I776 Arteritis, unspecified: Secondary | ICD-10-CM

## 2021-11-20 ENCOUNTER — Encounter: Payer: Self-pay | Admitting: Family Medicine

## 2021-11-21 NOTE — Telephone Encounter (Signed)
Patient scheduled.

## 2021-11-22 ENCOUNTER — Encounter: Payer: Self-pay | Admitting: Family Medicine

## 2021-11-22 ENCOUNTER — Telehealth: Payer: PRIVATE HEALTH INSURANCE | Admitting: Family Medicine

## 2021-11-22 MED ORDER — SEMAGLUTIDE-WEIGHT MANAGEMENT 0.5 MG/0.5ML ~~LOC~~ SOAJ: 0.5000 mg | SUBCUTANEOUS | 0 refills | Status: AC

## 2021-11-22 MED ORDER — SEMAGLUTIDE-WEIGHT MANAGEMENT 0.25 MG/0.5ML ~~LOC~~ SOAJ: 0.2500 mg | SUBCUTANEOUS | 0 refills | Status: AC

## 2021-11-22 MED ORDER — SEMAGLUTIDE-WEIGHT MANAGEMENT 2.4 MG/0.75ML ~~LOC~~ SOAJ
2.4000 mg | SUBCUTANEOUS | 1 refills | Status: DC
  Filled 2022-03-28: qty 3, 28d supply, fill #0
  Filled 2022-04-21 – 2022-04-25 (×2): qty 3, 28d supply, fill #1
  Filled 2022-05-20: qty 3, 28d supply, fill #2
  Filled 2022-06-21: qty 3, 28d supply, fill #3
  Filled 2022-07-19: qty 3, 28d supply, fill #4
  Filled 2022-08-17: qty 3, 28d supply, fill #5

## 2021-11-22 MED ORDER — SEMAGLUTIDE-WEIGHT MANAGEMENT 1.7 MG/0.75ML ~~LOC~~ SOAJ: 1.7000 mg | SUBCUTANEOUS | 0 refills | Status: AC

## 2021-11-22 MED ORDER — SEMAGLUTIDE-WEIGHT MANAGEMENT 1 MG/0.5ML ~~LOC~~ SOAJ
1.0000 mg | SUBCUTANEOUS | 0 refills | Status: AC
  Filled 2022-01-28 – 2022-02-03 (×2): qty 2, 28d supply, fill #0

## 2021-11-22 NOTE — Assessment & Plan Note (Signed)
Will start wegovy. Continue diet and exercise with goal of losing 1-2lbs per week. Recheck in 3 months for tolerance. Call with any concerns.  ?

## 2021-11-22 NOTE — Progress Notes (Signed)
? ?Wt 258 lb (117 kg)   BMI 39.23 kg/m?   ? ?Subjective:  ? ? Patient ID: Rachel SCARBERRY, female    DOB: 11-16-1975, 46 y.o.   MRN: [id number] ? ?HPI: ?Rachel Salas is a 46 y.o. female ? ?Chief Complaint  ?Patient presents with  ? Obesity  ?  Patient states she would like to discuss medication options for weight loss, would like to try wegovy.   ? ?OBESITY ?Duration: chronic ?Previous attempts at weight loss: yes ?Complications of obesity: HTN, GERD, Depression ?Peak weight: 260 ?Weight loss goal: to be healthy ?Weight loss to date: 2lbs ?Requesting obesity pharmacotherapy: yes ?Current weight loss supplements/medications: no ?Previous weight loss supplements/meds: no ? ?Relevant past medical, surgical, family and social history reviewed and updated as indicated. Interim medical history since our last visit reviewed. ?Allergies and medications reviewed and updated. ? ?Review of Systems  ?Constitutional: Negative.   ?Respiratory: Negative.    ?Cardiovascular: Negative.   ?Gastrointestinal: Negative.   ?Musculoskeletal: Negative.   ?Psychiatric/Behavioral: Negative.    ? ?Per HPI unless specifically indicated above ? ?   ?Objective:  ?  ?Wt 258 lb (117 kg)   BMI 39.23 kg/m?   ?Wt Readings from Last 3 Encounters:  ?11/22/21 258 lb (117 kg)  ?10/21/21 258 lb (117 kg)  ?10/16/21 258 lb (117 kg)  ?  ?Physical Exam ?Vitals and nursing note reviewed.  ?Constitutional:   ?   General: She is not in acute distress. ?   Appearance: Normal appearance. She is obese. She is not ill-appearing, toxic-appearing or diaphoretic.  ?HENT:  ?   Head: Normocephalic and atraumatic.  ?   Right Ear: External ear normal.  ?   Left Ear: External ear normal.  ?   Nose: Nose normal.  ?   Mouth/Throat:  ?   Mouth: Mucous membranes are moist.  ?   Pharynx: Oropharynx is clear.  ?Eyes:  ?   General: No scleral icterus.    ?   Right eye: No discharge.     ?   Left eye: No discharge.  ?   Conjunctiva/sclera: Conjunctivae normal.  ?   Pupils: Pupils  are equal, round, and reactive to light.  ?Pulmonary:  ?   Effort: Pulmonary effort is normal. No respiratory distress.  ?   Comments: Speaking in full sentences ?Musculoskeletal:     ?   General: Normal range of motion.  ?   Cervical back: Normal range of motion.  ?Skin: ?   Coloration: Skin is not jaundiced or pale.  ?   Findings: No bruising, erythema, lesion or rash.  ?Neurological:  ?   Mental Status: She is alert and oriented to person, place, and time. Mental status is at baseline.  ?Psychiatric:     ?   Mood and Affect: Mood normal.     ?   Behavior: Behavior normal.     ?   Thought Content: Thought content normal.     ?   Judgment: Judgment normal.  ? ? ?Results for orders placed or performed in visit on 10/10/21  ?Complement, total  ?Result Value Ref Range  ? Compl, Total (CH50) >60 >41 U/mL  ?C-reactive protein  ?Result Value Ref Range  ? CRP 20 (H) 0 - 10 mg/L  ?Sed Rate (ESR)  ?Result Value Ref Range  ? Sed Rate 57 (H) 0 - 32 mm/hr  ?Antinuclear Antib (ANA)  ?Result Value Ref Range  ? Anti Nuclear Antibody (ANA) Positive (A)  Negative  ?Urinalysis, Routine w reflex microscopic  ?Result Value Ref Range  ? Specific Gravity, UA >1.030 (H) 1.005 - 1.030  ? pH, UA 6.0 5.0 - 7.5  ? Color, UA Yellow Yellow  ? Appearance Ur Clear Clear  ? Leukocytes,UA Negative Negative  ? Protein,UA Negative Negative/Trace  ? Glucose, UA Negative Negative  ? Ketones, UA Trace (A) Negative  ? RBC, UA Trace (A) Negative  ? Bilirubin, UA Negative Negative  ? Urobilinogen, Ur 1.0 0.2 - 1.0 mg/dL  ? Nitrite, UA Negative Negative  ?Comprehensive metabolic panel  ?Result Value Ref Range  ? Glucose 100 (H) 70 - 99 mg/dL  ? BUN 11 6 - 24 mg/dL  ? Creatinine, Ser 1.01 (H) 0.57 - 1.00 mg/dL  ? eGFR 71 >59 mL/min/1.73  ? BUN/Creatinine Ratio 11 9 - 23  ? Sodium 139 134 - 144 mmol/L  ? Potassium 4.1 3.5 - 5.2 mmol/L  ? Chloride 106 96 - 106 mmol/L  ? CO2 21 20 - 29 mmol/L  ? Calcium 8.9 8.7 - 10.2 mg/dL  ? Total Protein 7.6 6.0 - 8.5 g/dL   ? Albumin 4.1 3.8 - 4.8 g/dL  ? Globulin, Total 3.5 1.5 - 4.5 g/dL  ? Albumin/Globulin Ratio 1.2 1.2 - 2.2  ? Bilirubin Total 0.3 0.0 - 1.2 mg/dL  ? Alkaline Phosphatase 85 44 - 121 IU/L  ? AST 15 0 - 40 IU/L  ? ALT 8 0 - 32 IU/L  ?IgA  ?Result Value Ref Range  ? IgA/Immunoglobulin A, Serum 281 87 - 352 mg/dL  ?Rheumatoid factor  ?Result Value Ref Range  ? Rhuematoid fact SerPl-aCnc <10.0 <14.0 IU/mL  ?ANA+ENA+DNA/DS+Antich+Centr  ?Result Value Ref Range  ? ANA Titer 1 Positive (A)   ? dsDNA Ab 1 0 - 9 IU/mL  ? ENA RNP Ab >8.0 (H) 0.0 - 0.9 AI  ? ENA SM Ab Ser-aCnc <0.2 0.0 - 0.9 AI  ? Scleroderma (Scl-70) (ENA) Antibody, IgG <0.2 0.0 - 0.9 AI  ? ENA SSA (RO) Ab <0.2 0.0 - 0.9 AI  ? ENA SSB (LA) Ab 0.2 0.0 - 0.9 AI  ? Chromatin Ab SerPl-aCnc 1.6 (H) 0.0 - 0.9 AI  ? Anti JO-1 <0.2 0.0 - 0.9 AI  ? Centromere Ab Screen <0.2 0.0 - 0.9 AI  ? See below: Comment   ?CK  ?Result Value Ref Range  ? Total CK 63 32 - 182 U/L  ?CBC with Differential/Platelet  ?Result Value Ref Range  ? WBC 6.5 3.4 - 10.8 x10E3/uL  ? RBC 4.54 3.77 - 5.28 x10E6/uL  ? Hemoglobin 10.6 (L) 11.1 - 15.9 g/dL  ? Hematocrit 34.0 34.0 - 46.6 %  ? MCV 75 (L) 79 - 97 fL  ? MCH 23.3 (L) 26.6 - 33.0 pg  ? MCHC 31.2 (L) 31.5 - 35.7 g/dL  ? RDW 18.0 (H) 11.7 - 15.4 %  ? Platelets 277 150 - 450 x10E3/uL  ? Neutrophils 63 Not Estab. %  ? Lymphs 29 Not Estab. %  ? Monocytes 5 Not Estab. %  ? Eos 2 Not Estab. %  ? Basos 1 Not Estab. %  ? Neutrophils Absolute 4.1 1.4 - 7.0 x10E3/uL  ? Lymphocytes Absolute 1.9 0.7 - 3.1 x10E3/uL  ? Monocytes Absolute 0.4 0.1 - 0.9 x10E3/uL  ? EOS (ABSOLUTE) 0.1 0.0 - 0.4 x10E3/uL  ? Basophils Absolute 0.0 0.0 - 0.2 x10E3/uL  ? Immature Granulocytes 0 Not Estab. %  ? Immature Grans (Abs) 0.0 0.0 - 0.1  x10E3/uL  ?Hepatitis C Antibody  ?Result Value Ref Range  ? Hep C Virus Ab Non Reactive Non Reactive  ?ANCA Profile  ?Result Value Ref Range  ? Anti-MPO Antibodies <0.2 0.0 - 0.9 units  ? Anti-PR3 Antibodies <0.2 0.0 - 0.9 units  ?  C-ANCA <1:20 Neg:<1:20 titer  ? P-ANCA <1:20 Neg:<1:20 titer  ? Atypical pANCA <1:20 Neg:<1:20 titer  ?FANA Staining Patterns  ?Result Value Ref Range  ? Homogeneous Pattern 1:640 (H)   ? Note: Comment   ?Surgical pathology  ?Result Value Ref Range  ? SURGICAL PATHOLOGY    ?  SURGICAL PATHOLOGY ?CASE: [id number] ?PATIENT: Dorice Miltner ?Surgical Pathology Report ? ? ? ? ?Clinical History: persistent rash on lateral right leg, vasculitis (HCC) ?(cm) ? ? ? ? ?FINAL MICROSCOPIC DIAGNOSIS: ? ?A. SKIN, RIGHT LATERAL LEG, PUNCH BIOPSY: ?Focal mild nonspecific perivascular inflammation in the dermis. ?Negative for vasculitis and neoplasm. ? ? ? ?GROSS DESCRIPTION: ? ?Received in formalin is a tan skin punch biopsy 0.5 cm in diameter and ?0.3 cm in depth.  The specimen is bisected and entirely submitted in 1 ?cassette.  (GRP 10/11/2021) ? ? ?Final Diagnosis performed by Unknown Jim, MD.   Electronically signed ?10/14/2021 ?Technical and / or Professional components performed at [employer]. [facility] [place], [place] [address] [place], [place] [address]. ? Immunohistochemistry Technical component (if applicable) was performed ?at [facility]. [address], [address], ?[place], [place] [address].   IMMUNOHISTOCHEMISTRY DISCLAIMER (if applicable): ?S ome of these immunohistochemical stains may have been developed and the ?performance characteristics determine by [facility]. Some ?may not have been cleared or approved by the U.S. Food and Drug ?Administration. The FDA has determined that such clearance or approval ?is not necessary. This test is used for clinical purposes. It should not ?be regarded as investigational or for research. This laboratory is ?certified under the Clinical Laboratory Improvement Amendments of 1988 ?(CLIA-88) as qualified to perform high complexity clinical laboratory ?testing.  The controls stained appropriately. ?  ? ?   ?Assessment & Plan:  ? ?Problem List Items  Addressed This Visit   ? ?  ? Other  ? Morbid obesity ([place])  ?  Will start wegovy. Continue diet and exercise with goal of losing 1-2lbs per week. Recheck in 3 months for tolerance. Call with any concerns.  ? ?  ?

## 2021-11-25 NOTE — Progress Notes (Signed)
LVM asking patient to call back to schedule follow up appointment  ?

## 2021-11-26 ENCOUNTER — Telehealth: Payer: Self-pay

## 2021-11-26 NOTE — Telephone Encounter (Signed)
PA initiated for [facility] 0.25MG  ?KEY: [id number]  ?Waiting on determination.  ?

## 2021-11-28 NOTE — Progress Notes (Signed)
2nd attempt to schedule follow up ?

## 2021-11-29 NOTE — Progress Notes (Signed)
3rd attempt to reach patient.

## 2021-12-07 ENCOUNTER — Encounter: Payer: Self-pay | Admitting: Family Medicine

## 2021-12-09 MED ORDER — ONDANSETRON HCL 4 MG PO TABS: 4.0000 mg | ORAL_TABLET | Freq: Three times a day (TID) | ORAL | 3 refills | Status: DC | PRN

## 2021-12-12 ENCOUNTER — Other Ambulatory Visit: Payer: Self-pay | Admitting: Family Medicine

## 2021-12-13 ENCOUNTER — Ambulatory Visit: Payer: PRIVATE HEALTH INSURANCE | Admitting: Family Medicine

## 2021-12-13 ENCOUNTER — Encounter: Payer: Self-pay | Admitting: Family Medicine

## 2021-12-13 MED ORDER — METOPROLOL SUCCINATE ER 25 MG PO TB24
25.0000 mg | ORAL_TABLET | Freq: Every day | ORAL | 1 refills | Status: DC
  Filled 2022-03-28: qty 90, 90d supply, fill #0
  Filled 2022-07-19: qty 90, 90d supply, fill #1

## 2021-12-16 NOTE — Telephone Encounter (Signed)
Rx 12/13/21 #90 1RF- filled by provider Requested Prescriptions  Pending Prescriptions Disp Refills  . metoprolol succinate (TOPROL-XL) 25 MG 24 hr tablet [Pharmacy Med Name: metoprolol succinate ER 25 mg tablet,extended release 24 hr (TOPROL-XL)] 90 tablet 1    Sig: Take 1 tablet (25 mg total) by mouth daily.     Cardiovascular:  Beta Blockers Failed - 12/12/2021 10:58 AM      Failed - Last BP in normal range    BP Readings from Last 1 Encounters:  10/21/21 (!) 147/93         Passed - Last Heart Rate in normal range    Pulse Readings from Last 1 Encounters:  10/21/21 80         Passed - Valid encounter within last 6 months    Recent Outpatient Visits          3 weeks ago Morbid obesity ([place])   [place], Megan P, DO   1 month ago Encounter for removal of sutures   [place], DO   2 months ago Encounter for removal of sutures   [place], DO   2 months ago Vasculitis [facility])   [place], [place], DO   6 months ago [place], Joes, DO      Future Appointments            In 2 months Wynetta Emery, Barb Merino, DO [employer], [facility]

## 2021-12-25 ENCOUNTER — Encounter: Payer: Self-pay | Admitting: Internal Medicine

## 2021-12-30 ENCOUNTER — Other Ambulatory Visit (HOSPITAL_COMMUNITY): Payer: Self-pay

## 2021-12-30 ENCOUNTER — Encounter: Payer: Self-pay | Admitting: Internal Medicine

## 2022-01-15 ENCOUNTER — Other Ambulatory Visit (HOSPITAL_COMMUNITY): Payer: Self-pay

## 2022-01-15 ENCOUNTER — Encounter: Payer: Self-pay | Admitting: Internal Medicine

## 2022-01-15 MED ORDER — HYDROXYCHLOROQUINE SULFATE 200 MG PO TABS
ORAL_TABLET | ORAL | 2 refills | Status: DC
  Filled 2022-01-15: qty 60, 30d supply, fill #0

## 2022-01-16 ENCOUNTER — Encounter: Payer: Self-pay | Admitting: Internal Medicine

## 2022-01-22 ENCOUNTER — Other Ambulatory Visit (HOSPITAL_COMMUNITY): Payer: Self-pay

## 2022-01-22 ENCOUNTER — Encounter: Payer: Self-pay | Admitting: Internal Medicine

## 2022-01-27 ENCOUNTER — Other Ambulatory Visit (HOSPITAL_COMMUNITY): Payer: Self-pay

## 2022-01-28 ENCOUNTER — Other Ambulatory Visit (HOSPITAL_COMMUNITY): Payer: Self-pay

## 2022-01-28 ENCOUNTER — Encounter: Payer: Self-pay | Admitting: Family Medicine

## 2022-01-29 ENCOUNTER — Telehealth: Payer: Self-pay

## 2022-01-29 NOTE — Telephone Encounter (Signed)
PA initiated via CoverMyMeds for Wegovy 1MG /0.5ML  Key:  Waiting on response

## 2022-01-30 ENCOUNTER — Other Ambulatory Visit (HOSPITAL_COMMUNITY): Payer: Self-pay

## 2022-01-30 ENCOUNTER — Encounter: Payer: Self-pay | Admitting: Internal Medicine

## 2022-01-30 NOTE — Telephone Encounter (Signed)
PA denied for [facility] 1MG . Patient recently switched to a new insurance and was denied because "You have not tried (a 75-month supply) or cannot use a non-glucagon-like peptide-1 receptor agonist weight loss drug (for example: Contrave, Qsymia)"

## 2022-02-03 ENCOUNTER — Other Ambulatory Visit (HOSPITAL_COMMUNITY): Payer: Self-pay

## 2022-02-04 ENCOUNTER — Other Ambulatory Visit (HOSPITAL_COMMUNITY): Payer: Self-pay

## 2022-02-04 NOTE — Telephone Encounter (Signed)
Paper fax received. Med has been approved.

## 2022-02-20 ENCOUNTER — Other Ambulatory Visit (HOSPITAL_COMMUNITY): Payer: Self-pay

## 2022-02-21 ENCOUNTER — Other Ambulatory Visit (HOSPITAL_COMMUNITY): Payer: Self-pay

## 2022-02-24 ENCOUNTER — Other Ambulatory Visit (HOSPITAL_COMMUNITY): Payer: Self-pay

## 2022-02-24 MED ORDER — WEGOVY 1.7 MG/0.75ML ~~LOC~~ SOAJ
SUBCUTANEOUS | 0 refills | Status: DC
  Filled 2022-02-24: qty 3, 28d supply, fill #0

## 2022-02-25 ENCOUNTER — Other Ambulatory Visit (HOSPITAL_COMMUNITY): Payer: Self-pay

## 2022-02-26 ENCOUNTER — Telehealth: Payer: PRIVATE HEALTH INSURANCE | Admitting: Physician Assistant

## 2022-02-26 DIAGNOSIS — R3989 Other symptoms and signs involving the genitourinary system: Secondary | ICD-10-CM | POA: Diagnosis not present

## 2022-02-26 MED ORDER — CEPHALEXIN 500 MG PO CAPS
500.0000 mg | ORAL_CAPSULE | Freq: Two times a day (BID) | ORAL | 0 refills | Status: DC
Start: 2022-02-26 — End: 2022-04-11
  Filled 2022-02-26: qty 14, 7d supply, fill #0

## 2022-02-26 NOTE — Progress Notes (Signed)

## 2022-02-27 ENCOUNTER — Encounter: Payer: Self-pay | Admitting: Internal Medicine

## 2022-02-27 ENCOUNTER — Other Ambulatory Visit (HOSPITAL_COMMUNITY): Payer: Self-pay

## 2022-02-28 ENCOUNTER — Encounter: Payer: Self-pay | Admitting: Family Medicine

## 2022-02-28 ENCOUNTER — Other Ambulatory Visit (HOSPITAL_COMMUNITY): Payer: Self-pay

## 2022-02-28 ENCOUNTER — Telehealth (INDEPENDENT_AMBULATORY_CARE_PROVIDER_SITE_OTHER): Payer: Self-pay | Admitting: Family Medicine

## 2022-02-28 MED ORDER — VALACYCLOVIR HCL 1 G PO TABS
2000.0000 mg | ORAL_TABLET | Freq: Two times a day (BID) | ORAL | 12 refills | Status: DC
  Filled 2022-02-28 – 2022-03-31 (×2): qty 40, 10d supply, fill #0
  Filled 2022-10-04 (×2): qty 40, 10d supply, fill #1

## 2022-02-28 MED ORDER — CYCLOBENZAPRINE HCL 10 MG PO TABS
10.0000 mg | ORAL_TABLET | Freq: Every day | ORAL | 2 refills | Status: AC
  Filled 2022-02-28 – 2022-05-20 (×2): qty 30, 30d supply, fill #0

## 2022-02-28 MED ORDER — BUPROPION HCL ER (XL) 300 MG PO TB24
300.0000 mg | ORAL_TABLET | Freq: Every day | ORAL | 1 refills | Status: DC
  Filled 2022-02-28 – 2022-03-21 (×2): qty 90, 90d supply, fill #0
  Filled 2022-10-07: qty 90, 90d supply, fill #1

## 2022-02-28 NOTE — Assessment & Plan Note (Signed)
Down 22lbs on wegovy and not on max dose yet. She is currently down 9.2% of her body weight. Continue current regimen. Continue to monitor. Call with any concerns. Follow up in next 1-3 months for physical.

## 2022-02-28 NOTE — Progress Notes (Signed)
BP (!) 129/90   Wt 236 lb (107 kg)   LMP 02/24/2022 (Approximate)   BMI 35.88 kg/m    Subjective:    Patient ID: Rachel Salas, female    DOB: Oct 02, 1975, 46 y.o.   MRN: [id number]  HPI: Rachel Salas is a 46 y.o. female  Chief Complaint  Patient presents with   Obesity    Pt states she is taking the last 1 mg dose of Wegovy, starting the 1.7 mg dose next week    OBESITY Duration: chronic Previous attempts at weight loss: yes Complications of obesity: HTN, GERD, DEPRESSION Peak weight: 260 Weight loss goal: to be healthy Weight loss to date: 24 lbs (9.2%) Requesting obesity pharmacotherapy: yes Current weight loss supplements/medications: yes Previous weight loss supplements/meds: no  Relevant past medical, surgical, family and social history reviewed and updated as indicated. Interim medical history since our last visit reviewed. Allergies and medications reviewed and updated.  Review of Systems  Constitutional: Negative.   Respiratory: Negative.    Cardiovascular: Negative.   Gastrointestinal:  Positive for nausea. Negative for abdominal distention, abdominal pain, anal bleeding, blood in stool, constipation, diarrhea, rectal pain and vomiting.  Psychiatric/Behavioral: Negative.      Per HPI unless specifically indicated above     Objective:    BP (!) 129/90   Wt 236 lb (107 kg)   LMP 02/24/2022 (Approximate)   BMI 35.88 kg/m   Wt Readings from Last 3 Encounters:  02/28/22 236 lb (107 kg)  11/22/21 258 lb (117 kg)  10/21/21 258 lb (117 kg)    Physical Exam Vitals and nursing note reviewed.  Constitutional:      General: She is not in acute distress.    Appearance: Normal appearance. She is obese. She is not ill-appearing, toxic-appearing or diaphoretic.  HENT:     Head: Normocephalic and atraumatic.     Right Ear: External ear normal.     Left Ear: External ear normal.     Nose: Nose normal.     Mouth/Throat:     Mouth: Mucous membranes are moist.      Pharynx: Oropharynx is clear.  Eyes:     General: No scleral icterus.       Right eye: No discharge.        Left eye: No discharge.     Conjunctiva/sclera: Conjunctivae normal.     Pupils: Pupils are equal, round, and reactive to light.  Pulmonary:     Effort: Pulmonary effort is normal. No respiratory distress.     Comments: Speaking in full sentences Musculoskeletal:        General: Normal range of motion.     Cervical back: Normal range of motion.  Skin:    Coloration: Skin is not jaundiced or pale.     Findings: No bruising, erythema, lesion or rash.  Neurological:     Mental Status: She is alert and oriented to person, place, and time. Mental status is at baseline.  Psychiatric:        Mood and Affect: Mood normal.        Behavior: Behavior normal.        Thought Content: Thought content normal.        Judgment: Judgment normal.     Results for orders placed or performed in visit on 10/10/21  Complement, total  Result Value Ref Range   Compl, Total (CH50) >60 >41 U/mL  C-reactive protein  Result Value Ref Range   CRP 20 (  H) 0 - 10 mg/L  Sed Rate (ESR)  Result Value Ref Range   Sed Rate 57 (H) 0 - 32 mm/hr  Antinuclear Antib (ANA)  Result Value Ref Range   Anti Nuclear Antibody (ANA) Positive (A) Negative  Urinalysis, Routine w reflex microscopic  Result Value Ref Range   Specific Gravity, UA >1.030 (H) 1.005 - 1.030   pH, UA 6.0 5.0 - 7.5   Color, UA Yellow Yellow   Appearance Ur Clear Clear   Leukocytes,UA Negative Negative   Protein,UA Negative Negative/Trace   Glucose, UA Negative Negative   Ketones, UA Trace (A) Negative   RBC, UA Trace (A) Negative   Bilirubin, UA Negative Negative   Urobilinogen, Ur 1.0 0.2 - 1.0 mg/dL   Nitrite, UA Negative Negative  Comprehensive metabolic panel  Result Value Ref Range   Glucose 100 (H) 70 - 99 mg/dL   BUN 11 6 - 24 mg/dL   Creatinine, Ser 1.01 (H) 0.57 - 1.00 mg/dL   eGFR 71 >59 mL/min/1.73    BUN/Creatinine Ratio 11 9 - 23   Sodium 139 134 - 144 mmol/L   Potassium 4.1 3.5 - 5.2 mmol/L   Chloride 106 96 - 106 mmol/L   CO2 21 20 - 29 mmol/L   Calcium 8.9 8.7 - 10.2 mg/dL   Total Protein 7.6 6.0 - 8.5 g/dL   Albumin 4.1 3.8 - 4.8 g/dL   Globulin, Total 3.5 1.5 - 4.5 g/dL   Albumin/Globulin Ratio 1.2 1.2 - 2.2   Bilirubin Total 0.3 0.0 - 1.2 mg/dL   Alkaline Phosphatase 85 44 - 121 IU/L   AST 15 0 - 40 IU/L   ALT 8 0 - 32 IU/L  IgA  Result Value Ref Range   IgA/Immunoglobulin A, Serum 281 87 - 352 mg/dL  Rheumatoid factor  Result Value Ref Range   Rhuematoid fact SerPl-aCnc <10.0 <14.0 IU/mL  ANA+ENA+DNA/DS+Antich+Centr  Result Value Ref Range   ANA Titer 1 Positive (A)    dsDNA Ab 1 0 - 9 IU/mL   ENA RNP Ab >8.0 (H) 0.0 - 0.9 AI   ENA SM Ab Ser-aCnc <0.2 0.0 - 0.9 AI   Scleroderma (Scl-70) (ENA) Antibody, IgG <0.2 0.0 - 0.9 AI   ENA SSA (RO) Ab <0.2 0.0 - 0.9 AI   ENA SSB (LA) Ab 0.2 0.0 - 0.9 AI   Chromatin Ab SerPl-aCnc 1.6 (H) 0.0 - 0.9 AI   Anti JO-1 <0.2 0.0 - 0.9 AI   Centromere Ab Screen <0.2 0.0 - 0.9 AI   See below: Comment   CK  Result Value Ref Range   Total CK 63 32 - 182 U/L  CBC with Differential/Platelet  Result Value Ref Range   WBC 6.5 3.4 - 10.8 x10E3/uL   RBC 4.54 3.77 - 5.28 x10E6/uL   Hemoglobin 10.6 (L) 11.1 - 15.9 g/dL   Hematocrit 34.0 34.0 - 46.6 %   MCV 75 (L) 79 - 97 fL   MCH 23.3 (L) 26.6 - 33.0 pg   MCHC 31.2 (L) 31.5 - 35.7 g/dL   RDW 18.0 (H) 11.7 - 15.4 %   Platelets 277 150 - 450 x10E3/uL   Neutrophils 63 Not Estab. %   Lymphs 29 Not Estab. %   Monocytes 5 Not Estab. %   Eos 2 Not Estab. %   Basos 1 Not Estab. %   Neutrophils Absolute 4.1 1.4 - 7.0 x10E3/uL   Lymphocytes Absolute 1.9 0.7 - 3.1 x10E3/uL   Monocytes  Absolute 0.4 0.1 - 0.9 x10E3/uL   EOS (ABSOLUTE) 0.1 0.0 - 0.4 x10E3/uL   Basophils Absolute 0.0 0.0 - 0.2 x10E3/uL   Immature Granulocytes 0 Not Estab. %   Immature Grans (Abs) 0.0 0.0 - 0.1 x10E3/uL   Hepatitis C Antibody  Result Value Ref Range   Hep C Virus Ab Non Reactive Non Reactive  ANCA Profile  Result Value Ref Range   Anti-MPO Antibodies <0.2 0.0 - 0.9 units   Anti-PR3 Antibodies <0.2 0.0 - 0.9 units   C-ANCA <1:20 Neg:<1:20 titer   P-ANCA <1:20 Neg:<1:20 titer   Atypical pANCA <1:20 Neg:<1:20 titer  FANA Staining Patterns  Result Value Ref Range   Homogeneous Pattern 1:640 (H)    Note: Comment   Surgical pathology  Result Value Ref Range   SURGICAL PATHOLOGY      SURGICAL PATHOLOGY CASE: [id number] PATIENT: Rachel Salas Surgical Pathology Report     Clinical History: persistent rash on lateral right leg, vasculitis (HCC) (cm)     FINAL MICROSCOPIC DIAGNOSIS:  A. SKIN, RIGHT LATERAL LEG, PUNCH BIOPSY: Focal mild nonspecific perivascular inflammation in the dermis. Negative for vasculitis and neoplasm.    GROSS DESCRIPTION:  Received in formalin is a tan skin punch biopsy 0.5 cm in diameter and 0.3 cm in depth.  The specimen is bisected and entirely submitted in 1 cassette.  [facility] 10/11/2021)   Final Diagnosis performed by Unknown Jim, MD.   Electronically signed 10/14/2021 Technical and / or Professional components performed at [facility]. [facility], [place] [place], [place] [address].  Immunohistochemistry Technical component (if applicable) was performed at [facility]. [address] [place], [place], [place] [address].   IMMUNOHISTOCHEMISTRY DISCLAIMER (if applicable): S ome of these immunohistochemical stains may have been developed and the performance characteristics determine by [facility]. Some may not have been cleared or approved by the U.S. Food and Drug Administration. The FDA has determined that such clearance or approval is not necessary. This test is used for clinical purposes. It should not be regarded as investigational or for research. This laboratory is certified under the  [place] (CLIA-88) as qualified to perform high complexity clinical laboratory testing.  The controls stained appropriately.       Assessment & Plan:   Problem List Items Addressed This Visit       Other   Morbid obesity ([place])    Down 22lbs on wegovy and not on max dose yet. She is currently down 9.2% of her body weight. Continue current regimen. Continue to monitor. Call with any concerns. Follow up in next 1-3 months for physical.        Follow up plan: Return 1-2 months physical (after 04/08/22).    This visit was completed via video visit through MyChart due to the restrictions of the COVID-19 pandemic. All issues as above were discussed and addressed. Physical exam was done as above through visual confirmation on video through MyChart. If it was felt that the patient should be evaluated in the office, they were directed there. The patient verbally consented to this visit. Location of the patient: home Location of the provider: work Those involved with this call:  Provider: Park Liter, DO CMA: Irena Reichmann, [place] Desk/Registration: [employer]  Time spent on call:  15 minutes with patient face to face via video conference. More than 50% of this time was spent in counseling and coordination of care. 23 minutes total spent  in review of patient's record and preparation of their chart.

## 2022-03-03 NOTE — Progress Notes (Signed)
LVM asking patient to call back to schedule her follow up appointment 

## 2022-03-06 ENCOUNTER — Other Ambulatory Visit (HOSPITAL_COMMUNITY): Payer: Self-pay

## 2022-03-06 ENCOUNTER — Telehealth: Payer: Self-pay | Admitting: Physician Assistant

## 2022-03-06 DIAGNOSIS — N898 Other specified noninflammatory disorders of vagina: Secondary | ICD-10-CM

## 2022-03-06 MED ORDER — FLUCONAZOLE 150 MG PO TABS
ORAL_TABLET | ORAL | 0 refills | Status: DC
  Filled 2022-03-06: qty 2, 3d supply, fill #0

## 2022-03-06 NOTE — Progress Notes (Signed)

## 2022-03-10 ENCOUNTER — Other Ambulatory Visit (HOSPITAL_COMMUNITY): Payer: Self-pay

## 2022-03-11 ENCOUNTER — Encounter: Payer: Self-pay | Admitting: Internal Medicine

## 2022-03-14 ENCOUNTER — Other Ambulatory Visit (HOSPITAL_COMMUNITY): Payer: Self-pay

## 2022-03-22 ENCOUNTER — Other Ambulatory Visit (HOSPITAL_COMMUNITY): Payer: Self-pay

## 2022-03-28 ENCOUNTER — Other Ambulatory Visit (HOSPITAL_COMMUNITY): Payer: Self-pay

## 2022-03-31 ENCOUNTER — Other Ambulatory Visit (HOSPITAL_COMMUNITY): Payer: Self-pay

## 2022-04-01 ENCOUNTER — Other Ambulatory Visit (HOSPITAL_COMMUNITY): Payer: Self-pay

## 2022-04-11 ENCOUNTER — Encounter: Payer: Self-pay | Admitting: Internal Medicine

## 2022-04-11 ENCOUNTER — Encounter: Payer: Self-pay | Admitting: Family Medicine

## 2022-04-11 ENCOUNTER — Ambulatory Visit (INDEPENDENT_AMBULATORY_CARE_PROVIDER_SITE_OTHER): Payer: 59 | Admitting: Family Medicine

## 2022-04-11 ENCOUNTER — Other Ambulatory Visit (HOSPITAL_COMMUNITY)
Admission: RE | Admit: 2022-04-11 | Discharge: 2022-04-11 | Disposition: A | Discharge status: Home / Self Care | Payer: 59 | Source: Ambulatory Visit | Attending: Family Medicine | Admitting: Family Medicine

## 2022-04-11 ENCOUNTER — Other Ambulatory Visit (HOSPITAL_COMMUNITY): Payer: Self-pay

## 2022-04-11 VITALS — BP 131/88 | HR 83 | Temp 98.1°F | Ht 67.0 in | Wt 231.2 lb

## 2022-04-11 DIAGNOSIS — K219 Gastro-esophageal reflux disease without esophagitis: Secondary | ICD-10-CM

## 2022-04-11 DIAGNOSIS — F321 Major depressive disorder, single episode, moderate: Secondary | ICD-10-CM

## 2022-04-11 DIAGNOSIS — Z0000 Encounter for general adult medical examination without abnormal findings: Secondary | ICD-10-CM | POA: Insufficient documentation

## 2022-04-11 DIAGNOSIS — L509 Urticaria, unspecified: Secondary | ICD-10-CM | POA: Diagnosis not present

## 2022-04-11 DIAGNOSIS — I10 Essential (primary) hypertension: Secondary | ICD-10-CM | POA: Diagnosis not present

## 2022-04-11 LAB — URINALYSIS, ROUTINE W REFLEX MICROSCOPIC
Bilirubin, UA: NEGATIVE
Glucose, UA: NEGATIVE
Ketones, UA: NEGATIVE
Nitrite, UA: NEGATIVE
Specific Gravity, UA: 1.025 (ref 1.005–1.030)
Urobilinogen, Ur: 1 mg/dL (ref 0.2–1.0)
pH, UA: 6 (ref 5.0–7.5)

## 2022-04-11 LAB — MICROSCOPIC EXAMINATION

## 2022-04-11 LAB — MICROALBUMIN, URINE WAIVED
Creatinine, Urine Waived: 200 mg/dL (ref 10–300)
Microalb, Ur Waived: 80 mg/L — ABNORMAL HIGH (ref 0–19)
Microalb/Creat Ratio: <30 mg/g (ref <30)

## 2022-04-11 MED ORDER — TRIAMCINOLONE ACETONIDE 40 MG/ML IJ SUSP
80.0000 mg | Freq: Once | INTRAMUSCULAR | Status: AC
  Administered 2022-04-11: 80 mg via INTRAMUSCULAR

## 2022-04-11 MED ORDER — PREDNISONE 10 MG PO TABS
ORAL_TABLET | ORAL | 0 refills | Status: DC
  Filled 2022-04-11: qty 42, 12d supply, fill #0

## 2022-04-11 NOTE — Progress Notes (Unsigned)
BP 131/88   Pulse 83   Temp 98.1 F (36.7 C)   Ht '5\' 7"'  (1.702 m)   Wt 231 lb 3.2 oz (104.9 kg)   SpO2 98%   BMI 36.21 kg/m    Subjective:    Patient ID: Rachel Salas, female    DOB: 04-Oct-1975, 46 y.o.   MRN: [id number]  HPI: Rachel Salas is a 46 y.o. female presenting on 04/11/2022 for comprehensive medical examination. Current medical complaints include:  OBESITY Duration: chroinc Previous attempts at weight loss: yes Complications of obesity: HTN, depression, GERD Peak weight: 260 Weight loss goal: to he healthy  Weight loss to date: 29lbs (11.1%) Requesting obesity pharmacotherapy: yes Current weight loss supplements/medications: yes Previous weight loss supplements/meds: no  HYPERTENSION Hypertension status: controlled  Satisfied with current treatment? yes Duration of hypertension: chronic BP monitoring frequency:  not checking BP medication side effects:  no Medication compliance: excellent compliance Previous BP meds: metoprolol  Aspirin: no Recurrent headaches: no Visual changes: no Palpitations: no Dyspnea: no Chest pain: no Lower extremity edema: no Dizzy/lightheaded: no  DEPRESSION Mood status: controlled Satisfied with current treatment?: yes Symptom severity: mild  Duration of current treatment : chronic Side effects: no Medication compliance: excellent compliance Psychotherapy/counseling: no  Previous psychiatric medications: wellbutrin Depressed mood: no Anxious mood: no Anhedonia: no Significant weight loss or gain: no Insomnia: no  Fatigue: no Feelings of worthlessness or guilt: no Impaired concentration/indecisiveness: no Suicidal ideations: no Hopelessness: no Crying spells: no    04/11/2022    3:59 PM 02/28/2022    4:14 PM 11/22/2021    9:46 AM 10/10/2021    1:57 PM 04/08/2021    1:59 PM  Depression screen PHQ 2/9  Decreased Interest 0 0 0 0 0  Down, Depressed, Hopeless 0 0 0 0 0  PHQ - 2 Score 0 0 0 0 0  Altered sleeping  0 0 0 1 1  Tired, decreased energy 0 1 0 1 1  Change in appetite 0 0 0 0 0  Feeling bad or failure about yourself  0 0 0 0 0  Trouble concentrating 0 0 0 0 0  Moving slowly or fidgety/restless 0 0 0 0 0  Suicidal thoughts 0 0 0 0 0  PHQ-9 Score 0 1 0 2 2  Difficult doing work/chores Not difficult at all Not difficult at all   Not difficult at all   RASH Duration:   hours   Location: generalized  Itching: yes Burning: yes Redness: yes Oozing: no Scaling: yes Blisters: no Painful: no Fevers: no Change in detergents/soaps/personal care products: no Recent illness: no Recent travel:no History of same: no Context: worse Alleviating factors: nothing Treatments attempted:nothing Shortness of breath: no  Throat/tongue swelling: no Myalgias/arthralgias: no  Menopausal Symptoms: no  Depression Screen done today and results listed below:     04/11/2022    3:59 PM 02/28/2022    4:14 PM 11/22/2021    9:46 AM 10/10/2021    1:57 PM 04/08/2021    1:59 PM  Depression screen PHQ 2/9  Decreased Interest 0 0 0 0 0  Down, Depressed, Hopeless 0 0 0 0 0  PHQ - 2 Score 0 0 0 0 0  Altered sleeping 0 0 0 1 1  Tired, decreased energy 0 1 0 1 1  Change in appetite 0 0 0 0 0  Feeling bad or failure about yourself  0 0 0 0 0  Trouble concentrating 0 0 0 0 0  Moving slowly or fidgety/restless 0 0 0 0 0  Suicidal thoughts 0 0 0 0 0  PHQ-9 Score 0 1 0 2 2  Difficult doing work/chores Not difficult at all Not difficult at all   Not difficult at all   Past Medical History:  Past Medical History:  Diagnosis Date   Adenomyosis 11/15/2014   u/s suspicious for adenomyosis   Essential hypertension, benign 10/14/2012   GERD (gastroesophageal reflux disease)    Hypertension    Hypokalemia    IDA (iron deficiency anemia)    Menorrhagia with irregular cycle    Pseudotumor cerebri dx 2007   Severe obesity ([place])     Surgical History:  Past Surgical History:  Procedure Laterality Date    TONSILLECTOMY  2007   TONSILLECTOMY  2007   TUBAL LIGATION  2002    Medications:  Current Outpatient Medications on File Prior to Visit  Medication Sig   buPROPion (WELLBUTRIN XL) 300 MG 24 hr tablet Take 1 tablet (300 mg total) by mouth daily.   cyclobenzaprine (FLEXERIL) 10 MG tablet Take 1 tablet (10 mg total) by mouth at bedtime if needed.   EPINEPHrine 0.3 mg/0.3 mL IJ SOAJ injection Inject 0.42m into the muscle one time as needed for anaphylaxis   hydroxychloroquine (PLAQUENIL) 200 MG tablet Take 1 tablet by mouth 2 times a day with meals   metoprolol succinate (TOPROL-XL) 25 MG 24 hr tablet Take 1 tablet (25 mg total) by mouth daily.   ondansetron (ZOFRAN) 4 MG tablet Take 1 tablet (4 mg total) by mouth every 8 (eight) hours as needed for nausea or vomiting.   polyethylene glycol powder (GLYCOLAX/MIRALAX) 17 GM/SCOOP powder Take 17 g by mouth 2 (two) times daily as needed.   Semaglutide-Weight Management 2.4 MG/0.75ML SOAJ Inject 2.4 mg into the skin once a week.   valACYclovir (VALTREX) 1000 MG tablet Take 2 tablets (2,000 mg total) by mouth 2 (two) times daily for 1 day. Start as soon as you feel the symptoms.   ketorolac (TORADOL) 10 MG tablet Take 1 tablet (10 mg total) by mouth every 6 (six) hours as needed. (Patient not taking: Reported on 04/11/2022)   No current facility-administered medications on file prior to visit.    Allergies:  Allergies  Allergen Reactions   Naproxen Sodium Other (See Comments)    On second dose of Aleve, pt presented to ED with severe urticaria and swelling of lips. Pt did not require Epi, but received remainder of allergic reaction treatment. --LMaurine Cane MD   Tylenol [Acetaminophen] Hives   Other Palpitations    FEREHEME- CHEST HEAVINESS AND ARM PAIN    Social History:  Social History   Socioeconomic History   Marital status: Single    Spouse name: Not on file   Number of children: 3   Years of education: BS   Highest education level:  Not on file  Occupational History    Employer: [employer]  Tobacco Use   Smoking status: Never   Smokeless tobacco: Never  Vaping Use   Vaping Use: Never used  Substance and Sexual Activity   Alcohol use: Yes    Alcohol/week: 0.0 standard drinks of alcohol    Comment: Rare/Occasional   Drug use: No   Sexual activity: Yes    Partners: Male    Birth control/protection: Surgical, None    Comment: BTL  Other Topics Concern   Not on file  Social History Narrative   2-3 caffeine drinks a week  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 No smoking; occasional alcohol.    Social Determinants of Health   Financial Resource Strain: Not on file  Food Insecurity: Not on file  Transportation Needs: Not on file  Physical Activity: Not on file  Stress: Not on file  Social Connections: Not on file  Intimate Partner Violence: Not on file   Social History   Tobacco Use  Smoking Status Never  Smokeless Tobacco Never   Social History   Substance and Sexual Activity  Alcohol Use Yes   Alcohol/week: 0.0 standard drinks of alcohol   Comment: Rare/Occasional    Family History:  Family History  Problem Relation Age of Onset   Hypertension Mother    Hypercholesterolemia Mother    Thyroid disease Mother    Stroke Maternal Grandmother    Colon cancer Neg Hx    Breast cancer Neg Hx    Ovarian cancer Neg Hx    Heart disease Neg Hx     Past medical history, surgical history, medications, allergies, family history and social history reviewed with patient today and changes made to appropriate areas of the chart.   Review of Systems  Constitutional: Negative.   HENT: Negative.    Eyes: Negative.   Respiratory: Negative.    Cardiovascular: Negative.   Gastrointestinal: Negative.   Genitourinary: Negative.   Musculoskeletal: Negative.   Skin:  Positive for rash. Negative for itching.  Neurological: Negative.   Endo/Heme/Allergies: Negative.    Psychiatric/Behavioral: Negative.     All other ROS negative except what is listed above and in the HPI.      Objective:    BP 131/88   Pulse 83   Temp 98.1 F (36.7 C)   Ht '5\' 7"'  (1.702 m)   Wt 231 lb 3.2 oz (104.9 kg)   SpO2 98%   BMI 36.21 kg/m   Wt Readings from Last 3 Encounters:  04/11/22 231 lb 3.2 oz (104.9 kg)  02/28/22 236 lb (107 kg)  11/22/21 258 lb (117 kg)    Physical Exam Vitals and nursing note reviewed. Exam conducted with a chaperone present.  Constitutional:      General: She is not in acute distress.    Appearance: Normal appearance. She is not ill-appearing, toxic-appearing or diaphoretic.  HENT:     Head: Normocephalic and atraumatic.     Right Ear: Tympanic membrane, ear canal and external ear normal. There is no impacted cerumen.     Left Ear: Tympanic membrane, ear canal and external ear normal. There is no impacted cerumen.     Nose: Nose normal. No congestion or rhinorrhea.     Mouth/Throat:     Mouth: Mucous membranes are moist.     Pharynx: Oropharynx is clear. No oropharyngeal exudate or posterior oropharyngeal erythema.  Eyes:     General: No scleral icterus.       Right eye: No discharge.        Left eye: No discharge.     Extraocular Movements: Extraocular movements intact.     Conjunctiva/sclera: Conjunctivae normal.     Pupils: Pupils are equal, round, and reactive to light.  Neck:     Vascular: No carotid bruit.  Cardiovascular:     Rate and Rhythm: Normal rate and regular rhythm.     Pulses: Normal pulses.     Heart sounds: No murmur heard.    No friction rub. No gallop.  Pulmonary:     Effort: Pulmonary effort is normal.  No respiratory distress.     Breath sounds: Normal breath sounds. No stridor. No wheezing, rhonchi or rales.  Chest:     Chest wall: No tenderness.  Breasts:    Right: Normal.     Left: Normal.  Abdominal:     General: Abdomen is flat. Bowel sounds are normal. There is no distension.     Palpations:  Abdomen is soft. There is no mass.     Tenderness: There is no abdominal tenderness. There is no right CVA tenderness, left CVA tenderness, guarding or rebound.     Hernia: No hernia is present.  Genitourinary:    Labia:        Right: No rash, tenderness, lesion or injury.        Left: No rash, tenderness, lesion or injury.      Vagina: Normal.     Cervix: Normal.     Uterus: Normal.      Adnexa: Right adnexa normal and left adnexa normal.  Musculoskeletal:        General: No swelling, tenderness, deformity or signs of injury.     Cervical back: Normal range of motion and neck supple. No rigidity. No muscular tenderness.     Right lower leg: No edema.     Left lower leg: No edema.  Lymphadenopathy:     Cervical: No cervical adenopathy.  Skin:    General: Skin is warm and dry.     Capillary Refill: Capillary refill takes less than 2 seconds.     Coloration: Skin is not jaundiced or pale.     Findings: No bruising, erythema, lesion or rash.  Neurological:     General: No focal deficit present.     Mental Status: She is alert and oriented to person, place, and time. Mental status is at baseline.     Cranial Nerves: No cranial nerve deficit.     Sensory: No sensory deficit.     Motor: No weakness.     Coordination: Coordination normal.     Gait: Gait normal.     Deep Tendon Reflexes: Reflexes normal.  Psychiatric:        Mood and Affect: Mood normal.        Behavior: Behavior normal.        Thought Content: Thought content normal.        Judgment: Judgment normal.     Results for orders placed or performed in visit on 04/11/22  Microscopic Examination   Urine  Result Value Ref Range   WBC, UA 0-5 0 - 5 /hpf   RBC, Urine 3-10 (A) 0 - 2 /hpf   Epithelial Cells (non renal) 0-10 0 - 10 /hpf   Mucus, UA Present (A) Not Estab.   Bacteria, UA Few (A) None seen/Few  CBC with Differential/Platelet  Result Value Ref Range   WBC 5.7 3.4 - 10.8 x10E3/uL   RBC 4.19 3.77 - 5.28  x10E6/uL   Hemoglobin 10.0 (L) 11.1 - 15.9 g/dL   Hematocrit 31.9 (L) 34.0 - 46.6 %   MCV 76 (L) 79 - 97 fL   MCH 23.9 (L) 26.6 - 33.0 pg   MCHC 31.3 (L) 31.5 - 35.7 g/dL   RDW 15.9 (H) 11.7 - 15.4 %   Platelets 222 150 - 450 x10E3/uL   Neutrophils 64 Not Estab. %   Lymphs 30 Not Estab. %   Monocytes 5 Not Estab. %   Eos 1 Not Estab. %   Basos 0 Not Estab. %  Neutrophils Absolute 3.6 1.4 - 7.0 x10E3/uL   Lymphocytes Absolute 1.7 0.7 - 3.1 x10E3/uL   Monocytes Absolute 0.3 0.1 - 0.9 x10E3/uL   EOS (ABSOLUTE) 0.1 0.0 - 0.4 x10E3/uL   Basophils Absolute 0.0 0.0 - 0.2 x10E3/uL   Immature Granulocytes 0 Not Estab. %   Immature Grans (Abs) 0.0 0.0 - 0.1 x10E3/uL  Comprehensive metabolic panel  Result Value Ref Range   Glucose 99 70 - 99 mg/dL   BUN 11 6 - 24 mg/dL   Creatinine, Ser 0.93 0.57 - 1.00 mg/dL   eGFR 78 >59 mL/min/1.73   BUN/Creatinine Ratio 12 9 - 23   Sodium 139 134 - 144 mmol/L   Potassium 3.2 (L) 3.5 - 5.2 mmol/L   Chloride 105 96 - 106 mmol/L   CO2 21 20 - 29 mmol/L   Calcium 8.7 8.7 - 10.2 mg/dL   Total Protein 6.6 6.0 - 8.5 g/dL   Albumin 3.9 3.9 - 4.9 g/dL   Globulin, Total 2.7 1.5 - 4.5 g/dL   Albumin/Globulin Ratio 1.4 1.2 - 2.2   Bilirubin Total 0.4 0.0 - 1.2 mg/dL   Alkaline Phosphatase 70 44 - 121 IU/L   AST 10 0 - 40 IU/L   ALT 5 0 - 32 IU/L  Lipid Panel w/o Chol/HDL Ratio  Result Value Ref Range   Cholesterol, Total 128 100 - 199 mg/dL   Triglycerides 62 0 - 149 mg/dL   HDL 53 >39 mg/dL   VLDL Cholesterol Cal 13 5 - 40 mg/dL   LDL Chol Calc (NIH) 62 0 - 99 mg/dL  Urinalysis, Routine w reflex microscopic  Result Value Ref Range   Specific Gravity, UA 1.025 1.005 - 1.030   pH, UA 6.0 5.0 - 7.5   Color, UA Yellow Yellow   Appearance Ur Clear Clear   Leukocytes,UA Trace (A) Negative   Protein,UA Trace (A) Negative/Trace   Glucose, UA Negative Negative   Ketones, UA Negative Negative   RBC, UA Trace (A) Negative   Bilirubin, UA Negative  Negative   Urobilinogen, Ur 1.0 0.2 - 1.0 mg/dL   Nitrite, UA Negative Negative   Microscopic Examination See below:   TSH  Result Value Ref Range   TSH 1.410 0.450 - 4.500 uIU/mL  Microalbumin, Urine Waived  Result Value Ref Range   Microalb, Ur Waived 80 (H) 0 - 19 mg/L   Creatinine, Urine Waived 200 10 - 300 mg/dL   Microalb/Creat Ratio <30 <30 mg/g      Assessment & Plan:   Problem List Items Addressed This Visit       Cardiovascular and Mediastinum   HTN (hypertension)    Under good control on current regimen. Continue current regimen. Continue to monitor. Call with any concerns. Refills given. Labs drawn today.          Digestive   GERD (gastroesophageal reflux disease)    Under good control on current regimen. Continue current regimen. Continue to monitor. Call with any concerns. Refills given. Labs drawn today.         Other   Morbid obesity ([place])    Doing great. Down 29lbs for 11.1% of body weight. Doing great with wegovy. Will continue wegovy. Call with any concerns.       Depression, major, single episode, moderate ([place])    Under good control on current regimen. Continue current regimen. Continue to monitor. Call with any concerns. Refills given.       Other Visit Diagnoses  Routine general medical examination at a health care facility    -  Primary   Vaccines up to date. Screening labs checked today. Pap done. Mammo ordered. Continue diet and exercise. Call with any concerns.    Relevant Orders   CBC with Differential/Platelet (Completed)   Comprehensive metabolic panel (Completed)   Lipid Panel w/o Chol/HDL Ratio (Completed)   Cytology - PAP   Urinalysis, Routine w reflex microscopic (Completed)   TSH (Completed)   Microalbumin, Urine Waived (Completed)   Hives       73m triamcinalone given. Will start prednisone taper. Call with any concerns. Continue to monitor.    Relevant Medications   triamcinolone acetonide (KENALOG-40) injection 80 mg  (Completed)        Follow up plan: Return in about 6 months (around 10/10/2022).   LABORATORY TESTING:  - Pap smear: pap done  IMMUNIZATIONS:   - Tdap: Tetanus vaccination status reviewed: last tetanus booster within 10 years. - Influenza: Given elsewhere - Pneumovax: Not applicable - Prevnar: Not applicable - COVID: Up to date - HPV:  up to date - Shingrix vaccine: Not applicable  SCREENING: -Mammogram: Ordered today    PATIENT COUNSELING:   Advised to take 1 mg of folate supplement per day if capable of pregnancy.   Sexuality: Discussed sexually transmitted diseases, partner selection, use of condoms, avoidance of unintended pregnancy  and contraceptive alternatives.   Advised to avoid cigarette smoking.  I discussed with the patient that most people either abstain from alcohol or drink within safe limits (<=14/week and <=4 drinks/occasion for males, <=7/weeks and <= 3 drinks/occasion for females) and that the risk for alcohol disorders and other health effects rises proportionally with the number of drinks per week and how often a drinker exceeds daily limits.  Discussed cessation/primary prevention of drug use and availability of treatment for abuse.   Diet: Encouraged to adjust caloric intake to maintain  or achieve ideal body weight, to reduce intake of dietary saturated fat and total fat, to limit sodium intake by avoiding high sodium foods and not adding table salt, and to maintain adequate dietary potassium and calcium preferably from fresh fruits, vegetables, and low-fat dairy products.    stressed the importance of regular exercise  Injury prevention: Discussed safety belts, safety helmets, smoke detector, smoking near bedding or upholstery.   Dental health: Discussed importance of regular tooth brushing, flossing, and dental visits.    NEXT PREVENTATIVE PHYSICAL DUE IN 1 YEAR. Return in about 6 months (around 10/10/2022).

## 2022-04-12 ENCOUNTER — Other Ambulatory Visit (HOSPITAL_COMMUNITY): Payer: Self-pay

## 2022-04-12 LAB — COMPREHENSIVE METABOLIC PANEL
ALT: 5 IU/L (ref 0–32)
AST: 10 IU/L (ref 0–40)
Albumin/Globulin Ratio: 1.4 (ref 1.2–2.2)
Albumin: 3.9 g/dL (ref 3.9–4.9)
Alkaline Phosphatase: 70 IU/L (ref 44–121)
BUN/Creatinine Ratio: 12 (ref 9–23)
BUN: 11 mg/dL (ref 6–24)
Bilirubin Total: 0.4 mg/dL (ref 0.0–1.2)
CO2: 21 mmol/L (ref 20–29)
Calcium: 8.7 mg/dL (ref 8.7–10.2)
Chloride: 105 mmol/L (ref 96–106)
Creatinine, Ser: 0.93 mg/dL (ref 0.57–1.00)
Globulin, Total: 2.7 g/dL (ref 1.5–4.5)
Glucose: 99 mg/dL (ref 70–99)
Potassium: 3.2 mmol/L — ABNORMAL LOW (ref 3.5–5.2)
Sodium: 139 mmol/L (ref 134–144)
Total Protein: 6.6 g/dL (ref 6.0–8.5)
eGFR: 78 mL/min/{1.73_m2} (ref >59)

## 2022-04-12 LAB — CBC WITH DIFFERENTIAL/PLATELET
Basophils Absolute: 0 10*3/uL (ref 0.0–0.2)
Basos: 0 %
EOS (ABSOLUTE): 0.1 10*3/uL (ref 0.0–0.4)
Eos: 1 %
Hematocrit: 31.9 % — ABNORMAL LOW (ref 34.0–46.6)
Hemoglobin: 10 g/dL — ABNORMAL LOW (ref 11.1–15.9)
Immature Grans (Abs): 0 10*3/uL (ref 0.0–0.1)
Immature Granulocytes: 0 %
Lymphocytes Absolute: 1.7 10*3/uL (ref 0.7–3.1)
Lymphs: 30 %
MCH: 23.9 pg — ABNORMAL LOW (ref 26.6–33.0)
MCHC: 31.3 g/dL — ABNORMAL LOW (ref 31.5–35.7)
MCV: 76 fL — ABNORMAL LOW (ref 79–97)
Monocytes Absolute: 0.3 10*3/uL (ref 0.1–0.9)
Monocytes: 5 %
Neutrophils Absolute: 3.6 10*3/uL (ref 1.4–7.0)
Neutrophils: 64 %
Platelets: 222 10*3/uL (ref 150–450)
RBC: 4.19 x10E6/uL (ref 3.77–5.28)
RDW: 15.9 % — ABNORMAL HIGH (ref 11.7–15.4)
WBC: 5.7 10*3/uL (ref 3.4–10.8)

## 2022-04-12 LAB — LIPID PANEL W/O CHOL/HDL RATIO
Cholesterol, Total: 128 mg/dL (ref 100–199)
HDL: 53 mg/dL (ref >39)
LDL Chol Calc (NIH): 62 mg/dL (ref 0–99)
Triglycerides: 62 mg/dL (ref 0–149)
VLDL Cholesterol Cal: 13 mg/dL (ref 5–40)

## 2022-04-12 LAB — TSH: TSH: 1.41 u[IU]/mL (ref 0.450–4.500)

## 2022-04-13 NOTE — Assessment & Plan Note (Signed)
Doing great. Down 29lbs for 11.1% of body weight. Doing great with wegovy. Will continue wegovy. Call with any concerns.

## 2022-04-13 NOTE — Assessment & Plan Note (Signed)
Under good control on current regimen. Continue current regimen. Continue to monitor. Call with any concerns. Refills given. Labs drawn today.   

## 2022-04-13 NOTE — Assessment & Plan Note (Signed)
Under good control on current regimen. Continue current regimen. Continue to monitor. Call with any concerns. Refills given.   

## 2022-04-16 ENCOUNTER — Encounter: Payer: Self-pay | Admitting: Internal Medicine

## 2022-04-17 LAB — CYTOLOGY - PAP
Comment: NEGATIVE
Diagnosis: NEGATIVE
High risk HPV: NEGATIVE

## 2022-04-21 ENCOUNTER — Other Ambulatory Visit (HOSPITAL_COMMUNITY): Payer: Self-pay

## 2022-04-22 ENCOUNTER — Encounter: Payer: Self-pay | Admitting: Family Medicine

## 2022-04-25 ENCOUNTER — Other Ambulatory Visit (HOSPITAL_COMMUNITY): Payer: Self-pay

## 2022-04-29 ENCOUNTER — Encounter: Payer: Self-pay | Admitting: Family Medicine

## 2022-05-01 ENCOUNTER — Other Ambulatory Visit: Payer: Self-pay | Admitting: Family Medicine

## 2022-05-01 MED ORDER — NYSTATIN-TRIAMCINOLONE 100000-0.1 UNIT/GM-% EX OINT
1.0000 | TOPICAL_OINTMENT | Freq: Two times a day (BID) | CUTANEOUS | 0 refills | Status: DC
  Filled 2022-05-01: qty 30, 15d supply, fill #0

## 2022-05-02 ENCOUNTER — Other Ambulatory Visit (HOSPITAL_COMMUNITY): Payer: Self-pay

## 2022-05-05 ENCOUNTER — Other Ambulatory Visit (HOSPITAL_COMMUNITY): Payer: Self-pay

## 2022-05-17 ENCOUNTER — Other Ambulatory Visit (HOSPITAL_COMMUNITY): Payer: Self-pay

## 2022-05-21 ENCOUNTER — Other Ambulatory Visit (HOSPITAL_COMMUNITY): Payer: Self-pay

## 2022-05-23 LAB — HM MAMMOGRAPHY

## 2022-06-20 ENCOUNTER — Telehealth: Payer: 59 | Admitting: Physician Assistant

## 2022-06-20 ENCOUNTER — Other Ambulatory Visit (HOSPITAL_COMMUNITY): Payer: Self-pay

## 2022-06-20 DIAGNOSIS — B9689 Other specified bacterial agents as the cause of diseases classified elsewhere: Secondary | ICD-10-CM | POA: Diagnosis not present

## 2022-06-20 DIAGNOSIS — N760 Acute vaginitis: Secondary | ICD-10-CM | POA: Diagnosis not present

## 2022-06-20 MED ORDER — METRONIDAZOLE 500 MG PO TABS
500.0000 mg | ORAL_TABLET | Freq: Two times a day (BID) | ORAL | 0 refills | Status: AC
  Filled 2022-06-20: qty 14, 7d supply, fill #0

## 2022-06-20 NOTE — Progress Notes (Signed)
E-Visit for Vaginal Symptoms  We are sorry that you are not feeling well. Here is how we plan to help! Based on what you shared with me it looks like you: May have a vaginosis due to bacteria  Vaginosis is an inflammation of the vagina that can result in discharge, itching and pain. The cause is usually a change in the normal balance of vaginal bacteria or an infection. Vaginosis can also result from reduced estrogen levels after menopause.  The most common causes of vaginosis are:   Bacterial vaginosis which results from an overgrowth of one on several organisms that are normally present in your vagina.   Yeast infections which are caused by a naturally occurring fungus called candida.   Vaginal atrophy (atrophic vaginosis) which results from the thinning of the vagina from reduced estrogen levels after menopause.   Trichomoniasis which is caused by a parasite and is commonly transmitted by sexual intercourse.  Factors that increase your risk of developing vaginosis include: Medications, such as antibiotics and steroids Uncontrolled diabetes Use of hygiene products such as bubble bath, vaginal spray or vaginal deodorant Douching Wearing damp or tight-fitting clothing Using an intrauterine device (IUD) for birth control Hormonal changes, such as those associated with pregnancy, birth control pills or menopause Sexual activity Having a sexually transmitted infection  Your treatment plan is Metronidazole or Flagyl 500mg twice a day for 7 days.  I have electronically sent this prescription into the pharmacy that you have chosen.  Be sure to take all of the medication as directed. Stop taking any medication if you develop a rash, tongue swelling or shortness of breath. Mothers who are breast feeding should consider pumping and discarding their breast milk while on these antibiotics. However, there is no consensus that infant exposure at these doses would be harmful.  Remember that  medication creams can weaken latex condoms. .   HOME CARE:  Good hygiene may prevent some types of vaginosis from recurring and may relieve some symptoms:  Avoid baths, hot tubs and whirlpool spas. Rinse soap from your outer genital area after a shower, and dry the area well to prevent irritation. Don't use scented or harsh soaps, such as those with deodorant or antibacterial action. Avoid irritants. These include scented tampons and pads. Wipe from front to back after using the toilet. Doing so avoids spreading fecal bacteria to your vagina.  Other things that may help prevent vaginosis include:  Don't douche. Your vagina doesn't require cleansing other than normal bathing. Repetitive douching disrupts the normal organisms that reside in the vagina and can actually increase your risk of vaginal infection. Douching won't clear up a vaginal infection. Use a latex condom. Both female and female latex condoms may help you avoid infections spread by sexual contact. Wear cotton underwear. Also wear pantyhose with a cotton crotch. If you feel comfortable without it, skip wearing underwear to bed. Yeast thrives in moist environments Your symptoms should improve in the next day or two.  GET HELP RIGHT AWAY IF:  You have pain in your lower abdomen ( pelvic area or over your ovaries) You develop nausea or vomiting You develop a fever Your discharge changes or worsens You have persistent pain with intercourse You develop shortness of breath, a rapid pulse, or you faint.  These symptoms could be signs of problems or infections that need to be evaluated by a medical provider now.  MAKE SURE YOU   Understand these instructions. Will watch your condition. Will get help right   away if you are not doing well or get worse.  Thank you for choosing an e-visit.  Your e-visit answers were reviewed by a board certified advanced clinical practitioner to complete your personal care plan. Depending upon the  condition, your plan could have included both over the counter or prescription medications.  Please review your pharmacy choice. Make sure the pharmacy is open so you can pick up prescription now. If there is a problem, you may contact your provider through MyChart messaging and have the prescription routed to another pharmacy.  Your safety is important to us. If you have drug allergies check your prescription carefully.   For the next 24 hours you can use MyChart to ask questions about today's visit, request a non-urgent call back, or ask for a work or school excuse. You will get an email in the next two days asking about your experience. I hope that your e-visit has been valuable and will speed your recovery.  I have spent 5 minutes in review of e-visit questionnaire, review and updating patient chart, medical decision making and response to patient.   Lillith Mcneff M Lirio Bach, PA-C  

## 2022-06-21 ENCOUNTER — Other Ambulatory Visit (HOSPITAL_COMMUNITY): Payer: Self-pay

## 2022-07-03 ENCOUNTER — Encounter: Payer: Self-pay | Admitting: Internal Medicine

## 2022-07-05 ENCOUNTER — Encounter: Payer: Self-pay | Admitting: Internal Medicine

## 2022-07-15 ENCOUNTER — Encounter: Payer: Self-pay | Admitting: Family Medicine

## 2022-08-13 ENCOUNTER — Encounter: Payer: Self-pay | Admitting: Family Medicine

## 2022-08-23 ENCOUNTER — Other Ambulatory Visit (HOSPITAL_COMMUNITY): Payer: Self-pay

## 2022-09-09 ENCOUNTER — Encounter: Payer: Self-pay | Admitting: Family Medicine

## 2022-09-12 ENCOUNTER — Other Ambulatory Visit: Payer: Self-pay | Admitting: Family Medicine

## 2022-09-15 ENCOUNTER — Other Ambulatory Visit (HOSPITAL_COMMUNITY): Payer: Self-pay

## 2022-09-15 MED ORDER — WEGOVY 2.4 MG/0.75ML ~~LOC~~ SOAJ
2.4000 mg | SUBCUTANEOUS | 0 refills | Status: DC
  Filled 2022-09-15 – 2022-09-23 (×2): qty 3, 28d supply, fill #0

## 2022-09-15 NOTE — Telephone Encounter (Signed)
Requested medication (s) are due for refill today:   Yes  Requested medication (s) are on the active medication list:   Yes  Future visit scheduled:   Yes   Last ordered: 03/13/2022 9 ml, 1 refill  Returned because A1C is due per protocol.        Requested Prescriptions  Pending Prescriptions Disp Refills   Semaglutide-Weight Management (WEGOVY) 2.4 MG/0.75ML SOAJ 9 mL 1    Sig: Inject 2.4 mg into the skin once a week.     Endocrinology:  Diabetes - GLP-1 Receptor Agonists - semaglutide Failed - 09/12/2022  7:52 PM      Failed - HBA1C in normal range and within 180 days    No results found for: "HGBA1C", "LABA1C"       Passed - Cr in normal range and within 360 days    Creatinine, Ser  Date Value Ref Range Status  04/11/2022 0.93 0.57 - 1.00 mg/dL Final   Creatinine,U  Date Value Ref Range Status  12/14/2013 332.7 mg/dL Final         Passed - Valid encounter within last 6 months    Recent Outpatient Visits           5 months ago Routine general medical examination at a health care facility   [facility], [place] P, DO   6 months ago Morbid obesity [facility])   [place], Megan P, DO   9 months ago Morbid obesity [facility])   [place], Megan P, DO   10 months ago Encounter for removal of sutures   [place], Megan P, DO   11 months ago Encounter for removal of sutures   [place], [place], DO       Future Appointments             In 1 month Wynetta Emery, Barb Merino, DO [place], [facility]

## 2022-09-20 ENCOUNTER — Encounter: Payer: Self-pay | Admitting: Internal Medicine

## 2022-09-20 ENCOUNTER — Other Ambulatory Visit (HOSPITAL_COMMUNITY): Payer: Self-pay

## 2022-09-23 ENCOUNTER — Other Ambulatory Visit (HOSPITAL_COMMUNITY): Payer: Self-pay

## 2022-10-04 ENCOUNTER — Other Ambulatory Visit (HOSPITAL_COMMUNITY): Payer: Self-pay

## 2022-10-10 ENCOUNTER — Ambulatory Visit: Payer: 59 | Admitting: Family Medicine

## 2022-10-11 ENCOUNTER — Other Ambulatory Visit (HOSPITAL_COMMUNITY): Payer: Self-pay

## 2022-10-13 ENCOUNTER — Ambulatory Visit: Payer: 59 | Admitting: Family Medicine

## 2022-10-17 ENCOUNTER — Ambulatory Visit: Payer: 59 | Admitting: Family Medicine

## 2022-10-17 ENCOUNTER — Other Ambulatory Visit (HOSPITAL_COMMUNITY): Payer: Self-pay

## 2022-10-17 VITALS — BP 134/87 | HR 82 | Temp 98.7°F | Ht 67.0 in | Wt 189.3 lb

## 2022-10-17 DIAGNOSIS — I10 Essential (primary) hypertension: Secondary | ICD-10-CM

## 2022-10-17 DIAGNOSIS — F321 Major depressive disorder, single episode, moderate: Secondary | ICD-10-CM | POA: Diagnosis not present

## 2022-10-17 DIAGNOSIS — D500 Iron deficiency anemia secondary to blood loss (chronic): Secondary | ICD-10-CM

## 2022-10-17 DIAGNOSIS — Z8639 Personal history of other endocrine, nutritional and metabolic disease: Secondary | ICD-10-CM

## 2022-10-17 DIAGNOSIS — R899 Unspecified abnormal finding in specimens from other organs, systems and tissues: Secondary | ICD-10-CM

## 2022-10-17 DIAGNOSIS — E876 Hypokalemia: Secondary | ICD-10-CM | POA: Diagnosis not present

## 2022-10-17 DIAGNOSIS — R768 Other specified abnormal immunological findings in serum: Secondary | ICD-10-CM

## 2022-10-17 MED ORDER — ONDANSETRON HCL 4 MG PO TABS
4.0000 mg | ORAL_TABLET | Freq: Three times a day (TID) | ORAL | 3 refills | Status: DC | PRN
  Filled 2022-10-17: qty 60, 20d supply, fill #0

## 2022-10-17 MED ORDER — BUPROPION HCL ER (XL) 300 MG PO TB24
300.0000 mg | ORAL_TABLET | Freq: Every day | ORAL | 1 refills | Status: DC
  Filled 2022-10-17 – 2023-03-02 (×2): qty 90, 90d supply, fill #0

## 2022-10-17 MED ORDER — METOPROLOL SUCCINATE ER 25 MG PO TB24
25.0000 mg | ORAL_TABLET | Freq: Every day | ORAL | 1 refills | Status: DC
  Filled 2022-10-17: qty 90, 90d supply, fill #0
  Filled 2023-03-02: qty 90, 90d supply, fill #1

## 2022-10-17 MED ORDER — LOSARTAN POTASSIUM 25 MG PO TABS
25.0000 mg | ORAL_TABLET | Freq: Every day | ORAL | 3 refills | Status: DC
  Filled 2022-10-17: qty 30, 30d supply, fill #0

## 2022-10-17 MED ORDER — WEGOVY 2.4 MG/0.75ML ~~LOC~~ SOAJ
2.4000 mg | SUBCUTANEOUS | 1 refills | Status: DC
  Filled 2022-10-17: qty 9, 84d supply, fill #0
  Filled 2022-11-25 – 2023-03-02 (×2): qty 9, 84d supply, fill #1
  Filled ????-??-??: fill #1

## 2022-10-17 NOTE — Assessment & Plan Note (Signed)
Running a bit high. Will add losartan and recheck 1 month. Call with any concerns.

## 2022-10-17 NOTE — Assessment & Plan Note (Signed)
Rechecking labs today. Await results. Treat as needed.  °

## 2022-10-17 NOTE — Progress Notes (Signed)
BP 134/87   Pulse 82   Temp 98.7 F (37.1 C) (Oral)   Ht 5\' 7"  (123XX123 m)   Wt 189 lb 4.8 oz (85.9 kg)   SpO2 99%   BMI 29.65 kg/m    Subjective:    Patient ID: Rachel Salas, female    DOB: 1975/12/20, 47 y.o.   MRN: [id number]  HPI: Rachel Salas is a 47 y.o. female  Chief Complaint  Patient presents with   Hypertension    Patient says she has noticed here lately that her BP has been elevated. Patient says she has been having headaches. Patient says the highest 152/105 and says she was at work.    Depression   HYPERTENSION  Hypertension status: uncontrolled  Satisfied with current treatment? no Duration of hypertension: chronic BP monitoring frequency:  a few times a month BP range: 150s/100s BP medication side effects:  no Medication compliance: excellent compliance Previous BP meds:metoprolol Aspirin: no Recurrent headaches: yes Visual changes: yes Palpitations: no Dyspnea: no Chest pain: no Lower extremity edema: no Dizzy/lightheaded: no  DEPRESSION Mood status: controlled Satisfied with current treatment?: yes Symptom severity: mild  Duration of current treatment : chronic Side effects: no Medication compliance: excellent compliance Psychotherapy/counseling: no  Previous psychiatric medications: wellbutrin Depressed mood: no Anxious mood: no Anhedonia: no Significant weight loss or gain: no Insomnia: no  Fatigue: yes Feelings of worthlessness or guilt: no Impaired concentration/indecisiveness: no Suicidal ideations: no Hopelessness: no Crying spells: no    10/17/2022    2:53 PM 04/11/2022    3:59 PM 02/28/2022    4:14 PM 11/22/2021    9:46 AM 10/10/2021    1:57 PM  Depression screen PHQ 2/9  Decreased Interest 0 0 0 0 0  Down, Depressed, Hopeless 0 0 0 0 0  PHQ - 2 Score 0 0 0 0 0  Altered sleeping 0 0 0 0 1  Tired, decreased energy 0 0 1 0 1  Change in appetite 0 0 0 0 0  Feeling bad or failure about yourself  0 0 0 0 0  Trouble  concentrating 0 0 0 0 0  Moving slowly or fidgety/restless 0 0 0 0 0  Suicidal thoughts 0 0 0 0 0  PHQ-9 Score 0 0 1 0 2  Difficult doing work/chores  Not difficult at all Not difficult at all     OBESITY Duration: chronic Previous attempts at weight loss: yes Complications of obesity: HTN, depression, GERD  Peak weight: 260 Weight loss goal: to be healthy Weight loss to date: 71lbs Requesting obesity pharmacotherapy: yes Current weight loss supplements/medications: yes Previous weight loss supplements/meds: yes  Relevant past medical, surgical, family and social history reviewed and updated as indicated. Interim medical history since our last visit reviewed. Allergies and medications reviewed and updated.  Review of Systems  Constitutional: Negative.   Respiratory: Negative.    Cardiovascular: Negative.   Gastrointestinal: Negative.   Musculoskeletal: Negative.   Neurological: Negative.   Psychiatric/Behavioral: Negative.      Per HPI unless specifically indicated above     Objective:    BP 134/87   Pulse 82   Temp 98.7 F (37.1 C) (Oral)   Ht 5\' 7"  (1.702 m)   Wt 189 lb 4.8 oz (85.9 kg)   SpO2 99%   BMI 29.65 kg/m   Wt Readings from Last 3 Encounters:  10/17/22 189 lb 4.8 oz (85.9 kg)  04/11/22 231 lb 3.2 oz (104.9 kg)  02/28/22 236  lb (107 kg)    Physical Exam Vitals and nursing note reviewed.  Constitutional:      General: She is not in acute distress.    Appearance: Normal appearance. She is normal weight. She is not ill-appearing, toxic-appearing or diaphoretic.  HENT:     Head: Normocephalic and atraumatic.     Right Ear: External ear normal.     Left Ear: External ear normal.     Nose: Nose normal.     Mouth/Throat:     Mouth: Mucous membranes are moist.     Pharynx: Oropharynx is clear.  Eyes:     General: No scleral icterus.       Right eye: No discharge.        Left eye: No discharge.     Extraocular Movements: Extraocular movements intact.      Conjunctiva/sclera: Conjunctivae normal.     Pupils: Pupils are equal, round, and reactive to light.  Cardiovascular:     Rate and Rhythm: Normal rate and regular rhythm.     Pulses: Normal pulses.     Heart sounds: Normal heart sounds. No murmur heard.    No friction rub. No gallop.  Pulmonary:     Effort: Pulmonary effort is normal. No respiratory distress.     Breath sounds: Normal breath sounds. No stridor. No wheezing, rhonchi or rales.  Chest:     Chest wall: No tenderness.  Musculoskeletal:        General: Normal range of motion.     Cervical back: Normal range of motion and neck supple.  Skin:    General: Skin is warm and dry.     Capillary Refill: Capillary refill takes less than 2 seconds.     Coloration: Skin is not jaundiced or pale.     Findings: No bruising, erythema, lesion or rash.  Neurological:     General: No focal deficit present.     Mental Status: She is alert and oriented to person, place, and time. Mental status is at baseline.  Psychiatric:        Mood and Affect: Mood normal.        Behavior: Behavior normal.        Thought Content: Thought content normal.        Judgment: Judgment normal.     Results for orders placed or performed in visit on 05/28/22  HM MAMMOGRAPHY  Result Value Ref Range   HM Mammogram 0-4 Bi-Rad 0-4 Bi-Rad, Self Reported Normal      Assessment & Plan:   Problem List Items Addressed This Visit       Cardiovascular and Mediastinum   HTN (hypertension)    Running a bit high. Will add losartan and recheck 1 month. Call with any concerns.       Relevant Medications   losartan (COZAAR) 25 MG tablet   metoprolol succinate (TOPROL-XL) 25 MG 24 hr tablet   Other Relevant Orders   Basic metabolic panel     Other   Hypokalemia    Rechecking labs today. Await results. Treat as needed.       History of morbid obesity    Has lost 71lbs on wegovy. Tolerating it well. No concerns. Will continue current regimen. Refills  given.       Iron deficiency anemia due to chronic blood loss    Rechecking labs today. Await results. Treat as needed.       Relevant Orders   CBC with Differential/Platelet   Depression, major,  single episode, moderate ([place]) - Primary    Under good control on current regimen. Continue current regimen. Continue to monitor. Call with any concerns. Refills given.        Relevant Medications   buPROPion (WELLBUTRIN XL) 300 MG 24 hr tablet   Other Visit Diagnoses     ANA positive       Was not happy with care at [facility] rheum. Would like 2nd opinion. Not currently on anything.   Abnormal laboratory test       Was not happy with care at [facility] rheum. Would like 2nd opinion. Not currently on anything.   Relevant Orders   Ambulatory referral to Rheumatology        Follow up plan: Return in about 4 weeks (around 11/14/2022) for bp follow up.

## 2022-10-17 NOTE — Assessment & Plan Note (Signed)
Has lost 71lbs on wegovy. Tolerating it well. No concerns. Will continue current regimen. Refills given.

## 2022-10-17 NOTE — Assessment & Plan Note (Signed)
Under good control on current regimen. Continue current regimen. Continue to monitor. Call with any concerns. Refills given.   

## 2022-10-18 LAB — BASIC METABOLIC PANEL
BUN/Creatinine Ratio: 10 (ref 9–23)
BUN: 9 mg/dL (ref 6–24)
CO2: 21 mmol/L (ref 20–29)
Calcium: 8.7 mg/dL (ref 8.7–10.2)
Chloride: 107 mmol/L — ABNORMAL HIGH (ref 96–106)
Creatinine, Ser: 0.88 mg/dL (ref 0.57–1.00)
Glucose: 78 mg/dL (ref 70–99)
Potassium: 3.7 mmol/L (ref 3.5–5.2)
Sodium: 142 mmol/L (ref 134–144)
eGFR: 83 mL/min/{1.73_m2} (ref >59)

## 2022-10-18 LAB — CBC WITH DIFFERENTIAL/PLATELET
Basophils Absolute: 0 10*3/uL (ref 0.0–0.2)
Basos: 0 %
EOS (ABSOLUTE): 0.1 10*3/uL (ref 0.0–0.4)
Eos: 1 %
Hematocrit: 32.1 % — ABNORMAL LOW (ref 34.0–46.6)
Hemoglobin: 10.3 g/dL — ABNORMAL LOW (ref 11.1–15.9)
Immature Grans (Abs): 0 10*3/uL (ref 0.0–0.1)
Immature Granulocytes: 0 %
Lymphocytes Absolute: 1.4 10*3/uL (ref 0.7–3.1)
Lymphs: 29 %
MCH: 26.4 pg — ABNORMAL LOW (ref 26.6–33.0)
MCHC: 32.1 g/dL (ref 31.5–35.7)
MCV: 82 fL (ref 79–97)
Monocytes Absolute: 0.3 10*3/uL (ref 0.1–0.9)
Monocytes: 5 %
Neutrophils Absolute: 3.1 10*3/uL (ref 1.4–7.0)
Neutrophils: 65 %
Platelets: 239 10*3/uL (ref 150–450)
RBC: 3.9 x10E6/uL (ref 3.77–5.28)
RDW: 16.5 % — ABNORMAL HIGH (ref 11.7–15.4)
WBC: 4.9 10*3/uL (ref 3.4–10.8)

## 2022-10-25 ENCOUNTER — Other Ambulatory Visit (HOSPITAL_COMMUNITY): Payer: Self-pay

## 2022-11-10 ENCOUNTER — Telehealth: Payer: 59 | Admitting: Physician Assistant

## 2022-11-10 DIAGNOSIS — T3695XA Adverse effect of unspecified systemic antibiotic, initial encounter: Secondary | ICD-10-CM | POA: Diagnosis not present

## 2022-11-10 DIAGNOSIS — N760 Acute vaginitis: Secondary | ICD-10-CM

## 2022-11-10 DIAGNOSIS — B379 Candidiasis, unspecified: Secondary | ICD-10-CM | POA: Diagnosis not present

## 2022-11-10 DIAGNOSIS — B9689 Other specified bacterial agents as the cause of diseases classified elsewhere: Secondary | ICD-10-CM | POA: Diagnosis not present

## 2022-11-10 MED ORDER — FLUCONAZOLE 150 MG PO TABS
150.0000 mg | ORAL_TABLET | ORAL | 0 refills | Status: DC | PRN
  Filled 2022-11-10: qty 2, 6d supply, fill #0

## 2022-11-10 MED ORDER — METRONIDAZOLE 500 MG PO TABS
500.0000 mg | ORAL_TABLET | Freq: Two times a day (BID) | ORAL | 0 refills | Status: AC
  Filled 2022-11-10: qty 14, 7d supply, fill #0

## 2022-11-10 NOTE — Progress Notes (Signed)

## 2022-11-11 ENCOUNTER — Other Ambulatory Visit (HOSPITAL_COMMUNITY): Payer: Self-pay

## 2022-11-11 ENCOUNTER — Other Ambulatory Visit: Payer: Self-pay

## 2022-11-14 ENCOUNTER — Ambulatory Visit: Payer: 59 | Admitting: Family Medicine

## 2022-11-14 ENCOUNTER — Encounter: Payer: Self-pay | Admitting: Family Medicine

## 2022-11-14 ENCOUNTER — Other Ambulatory Visit (HOSPITAL_COMMUNITY): Payer: Self-pay

## 2022-11-14 VITALS — BP 126/70 | HR 82 | Temp 98.6°F | Ht 67.0 in | Wt 185.6 lb

## 2022-11-14 DIAGNOSIS — Z113 Encounter for screening for infections with a predominantly sexual mode of transmission: Secondary | ICD-10-CM | POA: Diagnosis not present

## 2022-11-14 DIAGNOSIS — I10 Essential (primary) hypertension: Secondary | ICD-10-CM

## 2022-11-14 MED ORDER — LOSARTAN POTASSIUM 25 MG PO TABS
25.0000 mg | ORAL_TABLET | Freq: Every day | ORAL | 1 refills | Status: DC
  Filled 2022-11-14: qty 90, 90d supply, fill #0

## 2022-11-14 NOTE — Progress Notes (Signed)
BP 126/70 Comment: Home Reading  Pulse 82   Temp 98.6 F (37 C) (Oral)   Ht 5\' 7"  (1.702 m)   Wt 185 lb 9.6 oz (84.2 kg)   SpO2 99%   BMI 29.07 kg/m    Subjective:    Patient ID: Rachel Salas, female    DOB: 03-27-1976, 47 y.o.   MRN: [id number]  HPI: Rachel Salas is a 47 y.o. female  Chief Complaint  Patient presents with   Hypertension   STD screening    Patient is asking if she can have the STD Panel done at today's visit.    HYPERTENSION Hypertension status: better  Satisfied with current treatment? yes Duration of hypertension: chronic BP monitoring frequency:  a few times a week BP medication side effects:  no Medication compliance: excellent compliance Previous BP meds: losartan, metoprolol Aspirin: no Recurrent headaches: no Visual changes: no Palpitations: no Dyspnea: no Chest pain: no Lower extremity edema: no Dizzy/lightheaded: no  Relevant past medical, surgical, family and social history reviewed and updated as indicated. Interim medical history since our last visit reviewed. Allergies and medications reviewed and updated.  Review of Systems  Constitutional: Negative.   Respiratory: Negative.    Cardiovascular: Negative.   Gastrointestinal: Negative.   Musculoskeletal: Negative.   Neurological: Negative.   Psychiatric/Behavioral: Negative.      Per HPI unless specifically indicated above     Objective:    BP 126/70 Comment: Home Reading  Pulse 82   Temp 98.6 F (37 C) (Oral)   Ht 5\' 7"  (1.702 m)   Wt 185 lb 9.6 oz (84.2 kg)   SpO2 99%   BMI 29.07 kg/m   Wt Readings from Last 3 Encounters:  11/14/22 185 lb 9.6 oz (84.2 kg)  10/17/22 189 lb 4.8 oz (85.9 kg)  04/11/22 231 lb 3.2 oz (104.9 kg)    Physical Exam Vitals and nursing note reviewed.  Constitutional:      General: She is not in acute distress.    Appearance: Normal appearance. She is not ill-appearing, toxic-appearing or diaphoretic.  HENT:     Head: Normocephalic  and atraumatic.     Right Ear: External ear normal.     Left Ear: External ear normal.     Nose: Nose normal.     Mouth/Throat:     Mouth: Mucous membranes are moist.     Pharynx: Oropharynx is clear.  Eyes:     General: No scleral icterus.       Right eye: No discharge.        Left eye: No discharge.     Extraocular Movements: Extraocular movements intact.     Conjunctiva/sclera: Conjunctivae normal.     Pupils: Pupils are equal, round, and reactive to light.  Cardiovascular:     Rate and Rhythm: Normal rate and regular rhythm.     Pulses: Normal pulses.     Heart sounds: Normal heart sounds. No murmur heard.    No friction rub. No gallop.  Pulmonary:     Effort: Pulmonary effort is normal. No respiratory distress.     Breath sounds: Normal breath sounds. No stridor. No wheezing, rhonchi or rales.  Chest:     Chest wall: No tenderness.  Musculoskeletal:        General: Normal range of motion.     Cervical back: Normal range of motion and neck supple.  Skin:    General: Skin is warm and dry.     Capillary  Refill: Capillary refill takes less than 2 seconds.     Coloration: Skin is not jaundiced or pale.     Findings: No bruising, erythema, lesion or rash.  Neurological:     General: No focal deficit present.     Mental Status: She is alert and oriented to person, place, and time. Mental status is at baseline.  Psychiatric:        Mood and Affect: Mood normal.        Behavior: Behavior normal.        Thought Content: Thought content normal.        Judgment: Judgment normal.     Results for orders placed or performed in visit on 11/14/22  Basic metabolic panel  Result Value Ref Range   Glucose 76 70 - 99 mg/dL   BUN 11 6 - 24 mg/dL   Creatinine, Ser 1.61 0.57 - 1.00 mg/dL   eGFR 78 >09 UE/AVW/0.98   BUN/Creatinine Ratio 12 9 - 23   Sodium 141 134 - 144 mmol/L   Potassium 3.1 (L) 3.5 - 5.2 mmol/L   Chloride 103 96 - 106 mmol/L   CO2 22 20 - 29 mmol/L   Calcium 9.4  8.7 - 10.2 mg/dL  HSV 1 and 2 Ab, IgG  Result Value Ref Range   HSV 1 Glycoprotein G Ab, IgG 10.80 (H) 0.00 - 0.90 index   HSV 2 IgG, Type Spec <0.91 0.00 - 0.90 index  HIV Antibody (routine testing w rflx)  Result Value Ref Range   HIV Screen 4th Generation wRfx Non Reactive Non Reactive  RPR  Result Value Ref Range   RPR Ser Ql Non Reactive Non Reactive  Acute Viral Hepatitis (HAV, HBV, HCV)  Result Value Ref Range   Hep A IgM Negative Negative   Hepatitis B Surface Ag Negative Negative   Hep B C IgM Negative Negative   HCV Ab Non Reactive Non Reactive  Interpretation:  Result Value Ref Range   HCV Interp 1: Comment       Assessment & Plan:   Problem List Items Addressed This Visit       Cardiovascular and Mediastinum   HTN (hypertension) - Primary    Under good control on current regimen. Continue current regimen. Continue to monitor. Call with any concerns. Refills given. Labs drawn today.       Relevant Medications   losartan (COZAAR) 25 MG tablet   Other Relevant Orders   Basic metabolic panel (Completed)   Other Visit Diagnoses     Screening examination for STI       Labs drawn today. Await results. Treat as needed.   Relevant Orders   HSV 1 and 2 Ab, IgG (Completed)   HIV Antibody (routine testing w rflx) (Completed)   RPR (Completed)   GC/Chlamydia Probe Amp   Acute Viral Hepatitis (HAV, HBV, HCV) (Completed)        Follow up plan: Return in about 6 months (around 05/16/2023).

## 2022-11-15 LAB — HSV 1 AND 2 AB, IGG
HSV 1 Glycoprotein G Ab, IgG: 10.8 index — ABNORMAL HIGH (ref 0.00–0.90)
HSV 2 IgG, Type Spec: <0.91 index (ref 0.00–0.90)

## 2022-11-15 LAB — BASIC METABOLIC PANEL
BUN/Creatinine Ratio: 12 (ref 9–23)
BUN: 11 mg/dL (ref 6–24)
CO2: 22 mmol/L (ref 20–29)
Calcium: 9.4 mg/dL (ref 8.7–10.2)
Chloride: 103 mmol/L (ref 96–106)
Creatinine, Ser: 0.93 mg/dL (ref 0.57–1.00)
Glucose: 76 mg/dL (ref 70–99)
Potassium: 3.1 mmol/L — ABNORMAL LOW (ref 3.5–5.2)
Sodium: 141 mmol/L (ref 134–144)
eGFR: 78 mL/min/{1.73_m2} (ref >59)

## 2022-11-15 LAB — ACUTE VIRAL HEPATITIS (HAV, HBV, HCV)
HCV Ab: NONREACTIVE
Hep A IgM: NEGATIVE
Hep B C IgM: NEGATIVE
Hepatitis B Surface Ag: NEGATIVE

## 2022-11-15 LAB — HIV ANTIBODY (ROUTINE TESTING W REFLEX): HIV Screen 4th Generation wRfx: NONREACTIVE

## 2022-11-15 LAB — HCV INTERPRETATION

## 2022-11-15 LAB — RPR: RPR Ser Ql: NONREACTIVE

## 2022-11-17 ENCOUNTER — Ambulatory Visit: Payer: Self-pay | Admitting: *Deleted

## 2022-11-17 ENCOUNTER — Ambulatory Visit: Payer: 59 | Admitting: Family Medicine

## 2022-11-17 ENCOUNTER — Encounter: Payer: Self-pay | Admitting: Family Medicine

## 2022-11-17 ENCOUNTER — Other Ambulatory Visit: Payer: Self-pay | Admitting: Family Medicine

## 2022-11-17 ENCOUNTER — Other Ambulatory Visit (HOSPITAL_COMMUNITY): Payer: Self-pay

## 2022-11-17 MED ORDER — LOSARTAN POTASSIUM 50 MG PO TABS
50.0000 mg | ORAL_TABLET | Freq: Every day | ORAL | 1 refills | Status: DC
  Filled 2022-11-17 – 2022-12-23 (×3): qty 90, 90d supply, fill #0
  Filled 2023-03-14: qty 90, 90d supply, fill #1

## 2022-11-17 NOTE — Assessment & Plan Note (Signed)
Under good control on current regimen. Continue current regimen. Continue to monitor. Call with any concerns. Refills given. Labs drawn today.   

## 2022-11-17 NOTE — Telephone Encounter (Signed)
  Chief Complaint: elevated BP  Symptoms: headache , dizzy esp. When laying down. Room spinning . Nausea. BP this am at work 157/98 and 146/100 .  Frequency: since 11/14/22 Pertinent Negatives: Patient denies chest pain no difficulty breathing no blurred vision. No decreased balance. No report N/T Disposition: [] ED /[] Urgent Care (no appt availability in office) / [x] Appointment(In office/virtual)/ []  [facility]/ [] Home Care/ [] Refused Recommended Disposition /[] [facility]/ []  Follow-up with PCP Additional Notes:  Appt scheduled today with PCP. Recommended to increase water intake. Recommended to go to ED if sx worsen.      Reason for Disposition  Systolic BP  >= 180 OR Diastolic >= 110  Answer Assessment - Initial Assessment Questions 1. BLOOD PRESSURE: "What is the blood pressure?" "Did you take at least two measurements 5 minutes apart?"     BP 157/98  rechecked for 146/100 2. ONSET: "When did you take your blood pressure?"     This am  3. HOW: "How did you take your blood pressure?" (e.g., automatic home BP monitor, visiting nurse)     At work 4. HISTORY: "Do you have a history of high blood pressure?"     Yes  5. MEDICINES: "Are you taking any medicines for blood pressure?" "Have you missed any doses recently?"     Taking meds has not missed doses 6. OTHER SYMPTOMS: "Do you have any symptoms?" (e.g., blurred vision, chest pain, difficulty breathing, headache, weakness)     Headache room spinning laying down nausea  7. PREGNANCY: "Is there any chance you are pregnant?" "When was your last menstrual period?"     na  Protocols used: Blood Pressure - High-A-AH

## 2022-11-18 LAB — GC/CHLAMYDIA PROBE AMP
Chlamydia trachomatis, NAA: NEGATIVE
Neisseria Gonorrhoeae by PCR: NEGATIVE

## 2022-11-21 ENCOUNTER — Other Ambulatory Visit (HOSPITAL_COMMUNITY): Payer: Self-pay

## 2022-11-25 ENCOUNTER — Other Ambulatory Visit (HOSPITAL_COMMUNITY): Payer: Self-pay

## 2022-11-27 ENCOUNTER — Other Ambulatory Visit (HOSPITAL_COMMUNITY): Payer: Self-pay

## 2022-11-27 ENCOUNTER — Encounter: Payer: Self-pay | Admitting: Internal Medicine

## 2022-11-27 MED ORDER — MEDROXYPROGESTERONE ACETATE 10 MG PO TABS
10.0000 mg | ORAL_TABLET | Freq: Every day | ORAL | 0 refills | Status: DC
  Filled 2022-11-27: qty 10, 10d supply, fill #0

## 2022-11-28 ENCOUNTER — Other Ambulatory Visit (HOSPITAL_COMMUNITY): Payer: Self-pay

## 2022-11-28 ENCOUNTER — Other Ambulatory Visit (HOSPITAL_BASED_OUTPATIENT_CLINIC_OR_DEPARTMENT_OTHER): Payer: Self-pay

## 2022-12-11 ENCOUNTER — Encounter: Payer: Self-pay | Admitting: Internal Medicine

## 2022-12-18 ENCOUNTER — Telehealth: Payer: Self-pay | Admitting: Family Medicine

## 2022-12-18 NOTE — Telephone Encounter (Signed)
I don't need to see her tomorrow unless she wants to be seen, and if she does it can be virtual.

## 2022-12-18 NOTE — Telephone Encounter (Signed)
Pt still wants to be seen, she just wanted her appointment to be virtual.

## 2022-12-18 NOTE — Telephone Encounter (Signed)
Copied from CRM [id number]. Topic: General - Inquiry >> Dec 18, 2022  8:37 AM Haroldine Laws wrote: Reason for CRM: pt wants to know if it is ok to do a my chart visits.

## 2022-12-19 ENCOUNTER — Telehealth: Payer: 59 | Admitting: Family Medicine

## 2022-12-20 ENCOUNTER — Other Ambulatory Visit (HOSPITAL_COMMUNITY): Payer: Self-pay

## 2022-12-22 ENCOUNTER — Other Ambulatory Visit (HOSPITAL_COMMUNITY): Payer: Self-pay

## 2022-12-23 ENCOUNTER — Other Ambulatory Visit (HOSPITAL_COMMUNITY): Payer: Self-pay

## 2023-01-15 ENCOUNTER — Telehealth: Payer: 59 | Admitting: Physician Assistant

## 2023-01-15 ENCOUNTER — Other Ambulatory Visit (HOSPITAL_COMMUNITY): Payer: Self-pay

## 2023-01-15 DIAGNOSIS — B9689 Other specified bacterial agents as the cause of diseases classified elsewhere: Secondary | ICD-10-CM | POA: Diagnosis not present

## 2023-01-15 DIAGNOSIS — B3731 Acute candidiasis of vulva and vagina: Secondary | ICD-10-CM

## 2023-01-15 DIAGNOSIS — N760 Acute vaginitis: Secondary | ICD-10-CM

## 2023-01-15 MED ORDER — FLUCONAZOLE 150 MG PO TABS
150.0000 mg | ORAL_TABLET | ORAL | 0 refills | Status: DC | PRN
Start: 2023-01-15 — End: 2023-04-09
  Filled 2023-01-15: qty 2, 6d supply, fill #0

## 2023-01-15 MED ORDER — METRONIDAZOLE 500 MG PO TABS
500.0000 mg | ORAL_TABLET | Freq: Two times a day (BID) | ORAL | 0 refills | Status: AC
Start: 2023-01-15 — End: 2023-01-26
  Filled 2023-01-15: qty 14, 7d supply, fill #0

## 2023-01-15 NOTE — Progress Notes (Signed)

## 2023-03-02 ENCOUNTER — Other Ambulatory Visit: Payer: Self-pay | Admitting: Family Medicine

## 2023-03-03 ENCOUNTER — Other Ambulatory Visit: Payer: Self-pay

## 2023-03-03 ENCOUNTER — Other Ambulatory Visit (HOSPITAL_COMMUNITY): Payer: Self-pay

## 2023-03-04 ENCOUNTER — Other Ambulatory Visit (HOSPITAL_COMMUNITY): Payer: Self-pay

## 2023-03-04 MED ORDER — VALACYCLOVIR HCL 1 G PO TABS
2000.0000 mg | ORAL_TABLET | Freq: Two times a day (BID) | ORAL | 5 refills | Status: DC
  Filled 2023-03-04: qty 120, 30d supply, fill #0

## 2023-03-04 NOTE — Telephone Encounter (Signed)
Requested Prescriptions  Pending Prescriptions Disp Refills   valACYclovir (VALTREX) 1000 MG tablet 120 tablet 5    Sig: Take 2 tablets (2,000 mg total) by mouth 2 (two) times daily for 1 day. Start as soon as you feel the symptoms.     Antimicrobials:  Antiviral Agents - Anti-Herpetic Passed - 03/02/2023  9:10 PM      Passed - Valid encounter within last 12 months    Recent Outpatient Visits           3 months ago Primary hypertension   [place] [facility] [place], Megan P, DO   4 months ago Depression, major, single episode, moderate (HCC)   [place] [facility] [place], Megan P, DO   10 months ago Routine general medical examination at a health care facility   [facility] [place], [place] P, DO   1 year ago Morbid obesity [facility])   [place] [facility] [place], Megan P, DO   1 year ago Morbid obesity [facility])   [place] [facility] Dorcas Carrow, DO       Future Appointments             In 2 months Pollyann Savoy, MD [facility] Rheumat[facility]

## 2023-03-18 ENCOUNTER — Emergency Department (HOSPITAL_BASED_OUTPATIENT_CLINIC_OR_DEPARTMENT_OTHER): Payer: 59

## 2023-03-18 ENCOUNTER — Other Ambulatory Visit: Payer: Self-pay

## 2023-03-18 ENCOUNTER — Emergency Department (HOSPITAL_BASED_OUTPATIENT_CLINIC_OR_DEPARTMENT_OTHER)
Admission: EM | Admit: 2023-03-18 | Discharge: 2023-03-18 | Disposition: A | Discharge status: Home / Self Care | Payer: 59 | Attending: Emergency Medicine | Admitting: Emergency Medicine

## 2023-03-18 ENCOUNTER — Encounter (HOSPITAL_BASED_OUTPATIENT_CLINIC_OR_DEPARTMENT_OTHER): Payer: Self-pay

## 2023-03-18 DIAGNOSIS — K8020 Calculus of gallbladder without cholecystitis without obstruction: Secondary | ICD-10-CM | POA: Diagnosis not present

## 2023-03-18 DIAGNOSIS — Z79899 Other long term (current) drug therapy: Secondary | ICD-10-CM | POA: Diagnosis not present

## 2023-03-18 DIAGNOSIS — R1031 Right lower quadrant pain: Secondary | ICD-10-CM | POA: Diagnosis present

## 2023-03-18 DIAGNOSIS — I10 Essential (primary) hypertension: Secondary | ICD-10-CM | POA: Insufficient documentation

## 2023-03-18 LAB — URINALYSIS, ROUTINE W REFLEX MICROSCOPIC
Bilirubin Urine: NEGATIVE
Glucose, UA: NEGATIVE mg/dL
Hgb urine dipstick: NEGATIVE
Ketones, ur: NEGATIVE mg/dL
Leukocytes,Ua: NEGATIVE
Nitrite: NEGATIVE
Protein, ur: NEGATIVE mg/dL
Specific Gravity, Urine: >=1.03 (ref 1.005–1.030)
pH: 6.5 (ref 5.0–8.0)

## 2023-03-18 LAB — CBC
HCT: 31.3 % — ABNORMAL LOW (ref 36.0–46.0)
Hemoglobin: 10.2 g/dL — ABNORMAL LOW (ref 12.0–15.0)
MCH: 26.5 pg (ref 26.0–34.0)
MCHC: 32.6 g/dL (ref 30.0–36.0)
MCV: 81.3 fL (ref 80.0–100.0)
Platelets: 177 10*3/uL (ref 150–400)
RBC: 3.85 MIL/uL — ABNORMAL LOW (ref 3.87–5.11)
RDW: 14.6 % (ref 11.5–15.5)
WBC: 4.2 10*3/uL (ref 4.0–10.5)
nRBC: 0 % (ref 0.0–0.2)

## 2023-03-18 LAB — COMPREHENSIVE METABOLIC PANEL
ALT: 8 U/L (ref 0–44)
AST: 13 U/L — ABNORMAL LOW (ref 15–41)
Albumin: 3.4 g/dL — ABNORMAL LOW (ref 3.5–5.0)
Alkaline Phosphatase: 61 U/L (ref 38–126)
Anion gap: 7 (ref 5–15)
BUN: 12 mg/dL (ref 6–20)
CO2: 23 mmol/L (ref 22–32)
Calcium: 8.5 mg/dL — ABNORMAL LOW (ref 8.9–10.3)
Chloride: 105 mmol/L (ref 98–111)
Creatinine, Ser: 0.92 mg/dL (ref 0.44–1.00)
GFR, Estimated: >60 mL/min (ref >60)
Glucose, Bld: 85 mg/dL (ref 70–99)
Potassium: 3.5 mmol/L (ref 3.5–5.1)
Sodium: 135 mmol/L (ref 135–145)
Total Bilirubin: 0.5 mg/dL (ref 0.3–1.2)
Total Protein: 6.9 g/dL (ref 6.5–8.1)

## 2023-03-18 LAB — PREGNANCY, URINE: Preg Test, Ur: NEGATIVE

## 2023-03-18 LAB — LIPASE, BLOOD: Lipase: 46 U/L (ref 11–51)

## 2023-03-18 MED ORDER — IOHEXOL 300 MG/ML  SOLN
100.0000 mL | Freq: Once | INTRAMUSCULAR | Status: AC | PRN
  Administered 2023-03-18: 100 mL via INTRAVENOUS

## 2023-03-18 NOTE — ED Provider Notes (Signed)
[place] [facility] Provider Note   CSN: [id number] Arrival date & time: 03/18/23  1650     History  Chief Complaint  Patient presents with   Abdominal Pain    Rachel Salas is a 47 y.o. female with history of hypertension, pseudotumor cerebri, GERD, iron deficiency anemia, menorrhagia, adenomyosis, who presents emergency department complaining of abdominal pain.  Patient localizes her pain to her right lower abdomen.  States has been going on about a week.  She states that it started gradually and progressively got worse until today.  She denies any associated symptoms such as fever, chills, nausea, vomiting, diarrhea, urinary symptoms, vaginal discharge or bleeding.  Denies any history of abdominal surgeries.     Abdominal Pain      Home Medications Prior to Admission medications   Medication Sig Start Date End Date Taking? Authorizing Provider  buPROPion (WELLBUTRIN XL) 300 MG 24 hr tablet Take 1 tablet (300 mg) by mouth daily. 10/17/22   Johnson, Megan P, DO  cyclobenzaprine (FLEXERIL) 10 MG tablet Take 1 tablet (10 mg total) by mouth at bedtime if needed. 02/28/22   Olevia Perches P, DO  EPINEPHrine 0.3 mg/0.3 mL IJ SOAJ injection Inject 0.10mL into the muscle one time as needed for anaphylaxis 06/28/20   [provider]  fluconazole (DIFLUCAN) 150 MG tablet Take 1 tablet (150 mg) by mouth every 3 days as needed. 01/15/23   Margaretann Loveless, PA-C  hydroxychloroquine (PLAQUENIL) 200 MG tablet Take 1 tablet by mouth 2 times a day with meals 11/26/21     losartan (COZAAR) 50 MG tablet Take 1 tablet (50 mg total) by mouth daily. 11/17/22   Olevia Perches P, DO  medroxyPROGESTERone (PROVERA) 10 MG tablet Take 1 tablet (10 mg) by mouth daily for 10 days 11/27/22   Nigel Bridgeman, CNM  metoprolol succinate (TOPROL-XL) 25 MG 24 hr tablet Take 1 tablet (25 mg total) by mouth daily. 10/17/22   Olevia Perches P, DO  nystatin-triamcinolone ointment  (MYCOLOG) Apply 1 Application topically 2 (two) times daily. 05/01/22   Johnson, Megan P, DO  ondansetron (ZOFRAN) 4 MG tablet Take 1 tablet (4 mg total) by mouth every 8 (eight) hours as needed for nausea or vomiting. 10/17/22   Laural Benes, Megan P, DO  polyethylene glycol powder (GLYCOLAX/MIRALAX) 17 GM/SCOOP powder Take 17 g by mouth 2 (two) times daily as needed. 07/30/20   Dorcas Carrow, DO  Semaglutide-Weight Management (WEGOVY) 2.4 MG/0.75ML SOAJ Inject 2.4 mg into the skin once a week. 10/17/22   Johnson, Megan P, DO  valACYclovir (VALTREX) 1000 MG tablet Take 2 tablets (2,000 mg total) by mouth 2 (two) times daily for 1 day. Start as soon as you feel the symptoms. 03/04/23   Olevia Perches P, DO      Allergies    Naproxen sodium, Tylenol [acetaminophen], and Other    Review of Systems   Review of Systems  Gastrointestinal:  Positive for abdominal pain.  All other systems reviewed and are negative.   Physical Exam Updated Vital Signs BP (!) 150/112 (BP Location: Right Arm)   Pulse 68   Temp 98.3 F (36.8 C) (Oral)   Resp 18   Ht 5\' 7"  (1.702 m)   Wt 82.1 kg   LMP 03/08/2023 (Exact Date)   SpO2 100%   BMI 28.35 kg/m  Physical Exam Vitals and nursing note reviewed.  Constitutional:      Appearance: Normal appearance.  HENT:  Head: Normocephalic and atraumatic.  Eyes:     Conjunctiva/sclera: Conjunctivae normal.  Cardiovascular:     Rate and Rhythm: Normal rate and regular rhythm.  Pulmonary:     Effort: Pulmonary effort is normal. No respiratory distress.     Breath sounds: Normal breath sounds.  Abdominal:     General: There is no distension.     Palpations: Abdomen is soft.     Tenderness: There is abdominal tenderness in the right lower quadrant. There is no guarding or rebound.  Skin:    General: Skin is warm and dry.  Neurological:     General: No focal deficit present.     Mental Status: She is alert.     ED Results / Procedures / Treatments    Labs (all labs ordered are listed, but only abnormal results are displayed) Labs Reviewed  COMPREHENSIVE METABOLIC PANEL - Abnormal; Notable for the following components:      Result Value   Calcium 8.5 (*)    Albumin 3.4 (*)    AST 13 (*)    All other components within normal limits  CBC - Abnormal; Notable for the following components:   RBC 3.85 (*)    Hemoglobin 10.2 (*)    HCT 31.3 (*)    All other components within normal limits  LIPASE, BLOOD  URINALYSIS, ROUTINE W REFLEX MICROSCOPIC  PREGNANCY, URINE    EKG None  Radiology CT ABDOMEN PELVIS W CONTRAST  Result Date: 03/18/2023 CLINICAL DATA:  Right lower quadrant abdominal pain. EXAM: CT ABDOMEN AND PELVIS WITH CONTRAST TECHNIQUE: Multidetector CT imaging of the abdomen and pelvis was performed using the standard protocol following bolus administration of intravenous contrast. RADIATION DOSE REDUCTION: This exam was performed according to the departmental dose-optimization program which includes automated exposure control, adjustment of the mA and/or kV according to patient size and/or use of iterative reconstruction technique. CONTRAST:  OMNIPAQUE IOHEXOL 300 MG/ML  SOLN COMPARISON:  None Available. FINDINGS: Lower chest: The visualized lung bases are clear. No intra-abdominal free air or free fluid. Hepatobiliary: The liver is unremarkable. No biliary dilatation. Gallstone. No pericholecystic fluid or evidence of acute cholecystitis by CT. Pancreas: Unremarkable. No pancreatic ductal dilatation or surrounding inflammatory changes. Spleen: Normal in size without focal abnormality. Adrenals/Urinary Tract: The adrenal glands are unremarkable. The kidneys, visualized ureters, and urinary bladder appear unremarkable. Stomach/Bowel: There is no bowel obstruction or active inflammation. The appendix is normal. Vascular/Lymphatic: The abdominal aorta and IVC are unremarkable. No portal venous gas. There is no adenopathy.  Reproductive: The uterus is anteverted and grossly unremarkable. No adnexal masses. Other: None Musculoskeletal: No acute or significant osseous findings. IMPRESSION: 1. No acute intra-abdominal or pelvic pathology. Normal appendix. 2. Cholelithiasis. Electronically Signed   By: Elgie Collard M.D.   On: 03/18/2023 19:56    Procedures Procedures    Medications Ordered in ED Medications  iohexol (OMNIPAQUE) 300 MG/ML solution 100 mL (100 mLs Intravenous Contrast Given 03/18/23 1926)    ED Course/ Medical Decision Making/ A&P                                 Medical Decision Making Amount and/or Complexity of Data Reviewed Labs: ordered. Radiology: ordered.  Risk Prescription drug management.  This patient is a 47 y.o. female  who presents to the ED for concern of RLQ abdominal pain x 1 week.   Differential diagnoses prior to evaluation: The emergent  differential diagnosis includes, but is not limited to,  PID, ectopic pregnancy, appendicitis, kidney stone, dysmenorrhea, septic abortion, ruptured ovarian cyst, ovarian torsion, tubo-ovarian abscess, fibroids, endometriosis, diverticulitis, cystitis. This is not an exhaustive differential.   Past Medical History / Co-morbidities / Social History: hypertension, pseudotumor cerebri, GERD, iron deficiency anemia, menorrhagia, adenomyosis  Additional history: Chart reviewed. Pertinent results include: Patient on Wegovy for weight management  Physical Exam: Physical exam performed. The pertinent findings include: Per tensive, otherwise normal vital signs.  No acute distress.  Right lower quadrant tenderness to palpation, abdomen soft.  Lab Tests/Imaging studies: I personally interpreted labs/imaging and the pertinent results include: No leukocytosis, stable hemoglobin.  CMP grossly unremarkable.  Urinalysis negative.  Normal lipase.  Negative pregnancy..   CT abdomen pelvis with no acute intra-abdominal findings.  Cholelithiasis  present in gallbladder. I agree with the radiologist interpretation.  Disposition: After consideration of the diagnostic results and the patients response to treatment, I feel that emergency department workup does not suggest an emergent condition requiring admission or immediate intervention beyond what has been performed at this time. The plan is: Discharged home with symptomatic management of abdominal pain, possibly related to biliary colic.  Also discussed possible adverse effect of Wegovy.  Patient has been on this for quite some time, but I did recommend her discussing it with her primary doctor.  Patient is agreeable to this plan.  No emergent etiology found for her symptoms today.. The patient is safe for discharge and has been instructed to return immediately for worsening symptoms, change in symptoms or any other concerns.  Final Clinical Impression(s) / ED Diagnoses Final diagnoses:  Right lower quadrant pain  Calculus of gallbladder without cholecystitis without obstruction    Rx / DC Orders ED Discharge Orders     None      Portions of this report may have been transcribed using voice recognition software. Every effort was made to ensure accuracy; however, inadvertent computerized transcription errors may be present.    Jeanella Flattery 03/18/23 2012    Charlynne Pander, MD 03/18/23 2225

## 2023-03-18 NOTE — ED Notes (Signed)
Patient transported to CT 

## 2023-03-18 NOTE — ED Triage Notes (Signed)
Pt complains of right lower abdominal pain x 1 week.

## 2023-03-18 NOTE — Discharge Instructions (Signed)
You were seen in the ER for right lower abdominal pain.   As we discussed, your blood work was reassuring. Your CT scan did not show anything concerning. You do have a gallstone in your gallbladder, but it is not blocking anything and it does not appear infected.  Gallstones can give you some symptoms, and I have attached some information about this.  I usually recommend cutting back on fatty foods in your diet to try and alleviate this.  Either that or you can pinpoint foods that tend to bother you more than others.  I recommend discussing this as well as your Wegovy injections with your primary doctor.  I have seen some people on Wegovy who have some nonspecific abdominal discomfort, regardless of how long they have been on it.  Your primary doctor may recommend that you follow-up with GI, and if your symptoms get really bothersome, you could investigate surgical removal of your gallbladder.  Continue to monitor how you're doing and return to the ER for new or worsening symptoms.

## 2023-03-19 ENCOUNTER — Telehealth: Payer: Self-pay

## 2023-03-19 NOTE — Transitions of Care (Post Inpatient/ED Visit) (Signed)
03/19/2023  Name: Rachel Salas MRN: [id number] DOB: [birth year]  Today's TOC FU Call Status: Today's TOC FU Call Status:: Successful TOC FU Call Completed TOC FU Call Complete Date: 03/19/23 Patient's Name and Date of Birth confirmed.  Transition Care Management Follow-up Telephone Call Date of Discharge: 03/18/23 Discharge Facility: [facility] Type of Discharge: Emergency Department Reason for ED Visit: Other: How have you been since you were released from the hospital?: Same Any questions or concerns?: No  Items Reviewed: Did you receive and understand the discharge instructions provided?: Yes Medications obtained,verified, and reconciled?: Yes (Medications Reviewed) Any new allergies since your discharge?: No Dietary orders reviewed?: No Do you have support at home?: Yes People in Home: alone  Medications Reviewed Today: Medications Reviewed Today     Reviewed by Pablo Ledger, CMA (Certified Medical Assistant) on 03/19/23 at 1221  Med List Status: <None>   Medication Order Taking? Sig Documenting Provider Last Dose Status Informant  buPROPion (WELLBUTRIN XL) 300 MG 24 hr tablet [id number] Yes Take 1 tablet (300 mg) by mouth daily. Johnson, Megan P, DO Taking Active   cyclobenzaprine (FLEXERIL) 10 MG tablet [id number] Yes Take 1 tablet (10 mg total) by mouth at bedtime if needed. Olevia Perches P, DO Taking Active   EPINEPHrine 0.3 mg/0.3 mL IJ SOAJ injection [id number] Yes Inject 0.52mL into the muscle one time as needed for anaphylaxis [provider] Taking Active   fluconazole (DIFLUCAN) 150 MG tablet [id number] Yes Take 1 tablet (150 mg) by mouth every 3 days as needed. Joycelyn Man M, PA-C Taking Active   hydroxychloroquine (PLAQUENIL) 200 MG tablet [id number] Yes Take 1 tablet by mouth 2 times a day with meals  Taking Active   losartan (COZAAR) 50 MG tablet [id number] Yes Take 1 tablet (50 mg total) by mouth daily. Olevia Perches P, DO Taking  Active   medroxyPROGESTERone (PROVERA) 10 MG tablet [id number] Yes Take 1 tablet (10 mg) by mouth daily for 10 days Nigel Bridgeman, CNM Taking Active   metoprolol succinate (TOPROL-XL) 25 MG 24 hr tablet [id number] Yes Take 1 tablet (25 mg total) by mouth daily. Dorcas Carrow, DO Taking Active   nystatin-triamcinolone ointment [facility]) [id number] Yes Apply 1 Application topically 2 (two) times daily. Laural Benes, Megan P, DO Taking Active   ondansetron (ZOFRAN) 4 MG tablet [id number] Yes Take 1 tablet (4 mg total) by mouth every 8 (eight) hours as needed for nausea or vomiting. Johnson, Megan P, DO Taking Active   polyethylene glycol powder (GLYCOLAX/MIRALAX) 17 GM/SCOOP powder [id number] Yes Take 17 g by mouth 2 (two) times daily as needed. Olevia Perches P, DO Taking Active   Semaglutide-Weight Management (WEGOVY) 2.4 MG/0.75ML Ivory Broad [id number] Yes Inject 2.4 mg into the skin once a week. Laural Benes, Megan P, DO Taking Active   valACYclovir (VALTREX) 1000 MG tablet [id number] Yes Take 2 tablets (2,000 mg total) by mouth 2 (two) times daily for 1 day. Start as soon as you feel the symptoms. Dorcas Carrow, DO Taking Active             Home Care and Equipment/Supplies: Were Home Health Services Ordered?: No Any new equipment or medical supplies ordered?: No  Functional Questionnaire: Do you need assistance with bathing/showering or dressing?: No Do you need assistance with meal preparation?: No Do you need assistance with eating?: No Do you have difficulty maintaining continence: No Do you need assistance with getting out of bed/getting out of a chair/moving?: No Do you have difficulty managing  or taking your medications?: No  Follow up appointments reviewed: PCP Follow-up appointment confirmed?: No (Patient declines to schedule at this time) MD Provider Line Number:[phone] Given: No Specialist Hospital Follow-up appointment confirmed?: No Do you need transportation to your follow-up  appointment?: No Do you understand care options if your condition(s) worsen?: Yes-patient verbalized understanding    SIGNATURE: Wilhemena Durie, CMA

## 2023-03-25 ENCOUNTER — Encounter: Payer: Self-pay | Admitting: Family Medicine

## 2023-03-25 DIAGNOSIS — Z1211 Encounter for screening for malignant neoplasm of colon: Secondary | ICD-10-CM

## 2023-04-02 ENCOUNTER — Other Ambulatory Visit (HOSPITAL_COMMUNITY): Payer: Self-pay

## 2023-04-02 ENCOUNTER — Encounter: Payer: Self-pay | Admitting: *Deleted

## 2023-04-02 ENCOUNTER — Other Ambulatory Visit: Payer: Self-pay | Admitting: *Deleted

## 2023-04-02 ENCOUNTER — Encounter: Payer: Self-pay | Admitting: Internal Medicine

## 2023-04-02 ENCOUNTER — Telehealth: Payer: Self-pay | Admitting: *Deleted

## 2023-04-02 DIAGNOSIS — Z1211 Encounter for screening for malignant neoplasm of colon: Secondary | ICD-10-CM

## 2023-04-02 MED ORDER — NA SULFATE-K SULFATE-MG SULF 17.5-3.13-1.6 GM/177ML PO SOLN
1.0000 | Freq: Once | ORAL | 0 refills | Status: AC
  Filled 2023-04-02: qty 354, 2d supply, fill #0

## 2023-04-02 NOTE — Telephone Encounter (Signed)
Gastroenterology Pre-Procedure Review  Request Date: 07/21/2022 Requesting Physician: Dr. Tobi Bastos  PATIENT REVIEW QUESTIONS: The patient responded to the following health history questions as indicated:    1. Are you having any GI issues? no 2. Do you have a personal history of Polyps? no 3. Do you have a family history of Colon Cancer or Polyps? no 4. Diabetes Mellitus? no 5. Joint replacements in the past 12 months?no 6. Major health problems in the past 3 months?no 7. Any artificial heart valves, MVP, or defibrillator?no    MEDICATIONS & ALLERGIES:    Patient reports the following regarding taking any anticoagulation/antiplatelet therapy:   Plavix, Coumadin, Eliquis, Xarelto, Lovenox, Pradaxa, Brilinta, or Effient? no Aspirin? no  Patient is taking  Semaglutide-Weight Management (WEGOVY) 2.4 MG/0.75ML   Patient confirms/reports the following medications:  Current Outpatient Medications  Medication Sig Dispense Refill   buPROPion (WELLBUTRIN XL) 300 MG 24 hr tablet Take 1 tablet (300 mg) by mouth daily. 90 tablet 1   cyclobenzaprine (FLEXERIL) 10 MG tablet Take 1 tablet (10 mg total) by mouth at bedtime if needed. 30 tablet 2   EPINEPHrine 0.3 mg/0.3 mL IJ SOAJ injection Inject 0.15mL into the muscle one time as needed for anaphylaxis     fluconazole (DIFLUCAN) 150 MG tablet Take 1 tablet (150 mg) by mouth every 3 days as needed. 2 tablet 0   hydroxychloroquine (PLAQUENIL) 200 MG tablet Take 1 tablet by mouth 2 times a day with meals 60 tablet 2   losartan (COZAAR) 50 MG tablet Take 1 tablet (50 mg total) by mouth daily. 90 tablet 1   medroxyPROGESTERone (PROVERA) 10 MG tablet Take 1 tablet (10 mg) by mouth daily for 10 days 10 tablet 0   metoprolol succinate (TOPROL-XL) 25 MG 24 hr tablet Take 1 tablet (25 mg total) by mouth daily. 90 tablet 1   nystatin-triamcinolone ointment (MYCOLOG) Apply 1 Application topically 2 (two) times daily. 30 g 0   ondansetron (ZOFRAN) 4 MG tablet Take 1  tablet (4 mg total) by mouth every 8 (eight) hours as needed for nausea or vomiting. 60 tablet 3   polyethylene glycol powder (GLYCOLAX/MIRALAX) 17 GM/SCOOP powder Take 17 g by mouth 2 (two) times daily as needed. 3350 g 1   Semaglutide-Weight Management (WEGOVY) 2.4 MG/0.75ML SOAJ Inject 2.4 mg into the skin once a week. 9 mL 1   valACYclovir (VALTREX) 1000 MG tablet Take 2 tablets (2,000 mg total) by mouth 2 (two) times daily for 1 day. Start as soon as you feel the symptoms. 120 tablet 5   No current facility-administered medications for this visit.    Patient confirms/reports the following allergies:  Allergies  Allergen Reactions   Naproxen Sodium Other (See Comments)    On second dose of Aleve, pt presented to ED with severe urticaria and swelling of lips. Pt did not require Epi, but received remainder of allergic reaction treatment. --Lara Mulch, MD   Tylenol [Acetaminophen] Hives   Other Palpitations    FEREHEME- CHEST HEAVINESS AND ARM PAIN    No orders of the defined types were placed in this encounter.   AUTHORIZATION INFORMATION Primary Insurance: 1D#: Group #:  Secondary Insurance: 1D#: Group #:  SCHEDULE INFORMATION: Date:  05/22/2023 Time: Location: [facility]

## 2023-04-03 ENCOUNTER — Other Ambulatory Visit (HOSPITAL_COMMUNITY): Payer: Self-pay

## 2023-04-03 NOTE — Progress Notes (Unsigned)
Office Visit Note  Patient: Rachel Salas             Date of Birth: [birth year]           MRN: [id number]             PCP: Dorcas Carrow, DO Referring: Dorcas Carrow, DO Visit Date: 04/09/2023 Occupation: @GUAROCC @  Subjective:  Rash on lower extremities and positive ANA  History of Present Illness: Rachel Salas is a 47 y.o. female seen in consultation per request of her PCP.  According the patient her symptoms started in 2019 with some rash on her lower extremities.  It was not pruritic.  She states she dealt with the rash for some time as it to last for a year and then go away.  She went to see her PCP and had a skin biopsy on October 10, 2021 which showed focal mild nonspecific perivascular inflammation in the dermis negative for vasculitis or neoplasm.  The rash did not leave any discoloration or scarring.  Her labs obtained by her PCP showed positive ANA and positive RNP.  Marland KitchenPatient was evaluated by Dr. Angie Fava [facility]) for the evaluation of positive ANA and rash.   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  Patient states she took hydroxychloroquine for couple years she did not notice any difference with the medication.  The rash is not as intense anymore.  She noticed occasional rash.  There is no history of oral ulcers, nasal ulcers, malar rash, fatigue, photosensitivity, Raynaud's phenomenon or lymphadenopathy.  She states she notices some puffiness in her [place] towards the end of the day which she gets better during the morning.  She denies any morning stiffness.  There is no family history of autoimmune disease.  She is gravida 3, para 3.  There is no history of DVTs or preeclampsia.  She has chronic anemia.  She had uterine ablation but anemia did not improve much.  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  She is unable to exercise much due to lack of  time.  Activities of Daily Living:  Patient reports morning stiffness for 0 minutes.   Patient Reports nocturnal pain.  Difficulty dressing/grooming: Denies Difficulty climbing stairs: Denies Difficulty getting out of chair: Denies Difficulty using [place] for taps, buttons, cutlery, and/or writing: Denies  Review of Systems  Constitutional:  Negative for fatigue.  HENT:  Negative for mouth sores and mouth dryness.   Eyes:  Negative for dryness.  Respiratory:  Negative for shortness of breath.   Cardiovascular:  Negative for chest pain and palpitations.  Gastrointestinal:  Positive for constipation. Negative for blood in stool and diarrhea.  Endocrine: Negative for increased urination.  Genitourinary:  Negative for involuntary urination.  Musculoskeletal:  Positive for joint pain, joint pain and joint swelling. Negative for gait problem, myalgias, muscle weakness, morning stiffness, muscle tenderness and myalgias.  Skin:  Positive for rash. Negative for color change, hair loss and sensitivity to sunlight.  Allergic/Immunologic: Negative for susceptible to infections.  Neurological:  Negative for dizziness and headaches.  Hematological:  Negative for swollen glands.  Psychiatric/Behavioral:  Negative for depressed mood and sleep disturbance. The patient is not nervous/anxious.     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   Iron deficiency anemia due to chronic blood loss 07/02/2018   Allergic reaction 05/04/2015   Snoring 12/19/2014   History of morbid obesity 07/30/2014   Heavy periods 07/30/2014   Hypokalemia 07/26/2014   Undiagnosed cardiac murmurs 05/21/2014   GERD (gastroesophageal reflux disease) 10/14/2012   Pseudotumor cerebri 10/14/2012   Chronic constipation 10/14/2012   Anemia  10/14/2012    Past Medical History:  Diagnosis Date   Adenomyosis 11/15/2014   u/s suspicious for adenomyosis   Essential hypertension, benign 10/14/2012   GERD (gastroesophageal reflux disease)    Hypertension    Hypokalemia    IDA (iron deficiency anemia)    Menorrhagia with irregular cycle    Pseudotumor cerebri dx 2007   Severe obesity (HCC)     Family History  Problem Relation Age of Onset   Hypertension Mother    Hypercholesterolemia Mother    Thyroid disease Mother    Healthy Brother    Stroke Maternal Grandmother    Healthy Daughter    Healthy Son    Healthy Son    Colon cancer Neg Hx    Breast cancer Neg Hx    Ovarian cancer Neg Hx    Heart disease Neg Hx    Past Surgical History:  Procedure Laterality Date   TONSILLECTOMY  2007   TUBAL LIGATION  2002   Social History   Social History Narrative   2-3 caffeine drinks a week       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 No smoking; occasional alcohol.    Immunization History  Administered Date(s) Administered   HPV 9-valent 11/20/2017, 01/28/2018, 06/10/2018   Influenza Split 04/28/2014, 03/31/2016   Influenza,inj,Quad PF,6+ Mos 04/15/2019, 04/08/2021   Influenza-Unspecified 04/09/2017, 04/20/2018, 05/18/2020   PFIZER(Purple Top)SARS-COV-2 Vaccination 08/02/2019, 08/22/2019   PPD Test 08/21/2018   Tdap 09/29/2013     Objective: Vital Signs: BP (!) 162/100 (BP Location: Right Arm, Patient Position: Sitting, Cuff Size: Normal)   Pulse 81   Resp 16   Ht 5' 7.5" (1.715 m)   Wt 185 lb 6.4 oz (84.1 kg)   LMP 04/04/2023 (Exact Date)   BMI 28.61 kg/m    Physical Exam Vitals and nursing note reviewed.  Constitutional:      Appearance: She is well-developed.  HENT:     Head: Normocephalic and atraumatic.  Eyes:     Conjunctiva/sclera: Conjunctivae normal.  Cardiovascular:     Rate and Rhythm: Normal rate and regular rhythm.     Heart sounds: Normal heart sounds.  Pulmonary:      Effort: Pulmonary effort is normal.     Breath sounds: Normal breath sounds.  Abdominal:     General: Bowel sounds are normal.     Palpations: Abdomen is soft.  Musculoskeletal:     Cervical back: Normal range of motion.  Lymphadenopathy:     Cervical: No cervical adenopathy.  Skin:    General: Skin is warm and dry.     Capillary Refill: Capillary refill takes less than 2 seconds.     Comments: Fine petechiae noted on bilateral lower extremities.  No nailbed capillary changes were noted.  No sclerodactyly was noted.  She had good capillary refill.  No telangiectasia were noted.  Neurological:     Mental Status: She is alert and oriented to person, place, and time.  Psychiatric:        Behavior: Behavior normal.  Musculoskeletal Exam: Cervical thoracic and lumbar spine were in good range of motion.  Shoulder joints, elbow joints, wrist joints, MCPs PIPs and DIPs were in good range of motion with no synovitis.  Hip joints, knee joints, ankles, MTPs and PIPs with good range of motion with no synovitis.  CDAI Exam: CDAI Score: -- Patient Global: --; Provider Global: -- Swollen: --; Tender: -- Joint Exam 04/09/2023   No joint exam has been documented for this visit   There is currently no information documented on the homunculus. Go to the Rheumatology activity and complete the homunculus joint exam.  Investigation: No additional findings.  Imaging: CT ABDOMEN PELVIS W CONTRAST  Result Date: 03/18/2023 CLINICAL DATA:  Right lower quadrant abdominal pain. EXAM: CT ABDOMEN AND PELVIS WITH CONTRAST TECHNIQUE: Multidetector CT imaging of the abdomen and pelvis was performed using the standard protocol following bolus administration of intravenous contrast. RADIATION DOSE REDUCTION: This exam was performed according to the departmental dose-optimization program which includes automated exposure control, adjustment of the mA and/or kV according to patient size and/or use of iterative  reconstruction technique. CONTRAST:  OMNIPAQUE IOHEXOL 300 MG/ML  SOLN COMPARISON:  None Available. FINDINGS: Lower chest: The visualized lung bases are clear. No intra-abdominal free air or free fluid. Hepatobiliary: The liver is unremarkable. No biliary dilatation. Gallstone. No pericholecystic fluid or evidence of acute cholecystitis by CT. Pancreas: Unremarkable. No pancreatic ductal dilatation or surrounding inflammatory changes. Spleen: Normal in size without focal abnormality. Adrenals/Urinary Tract: The adrenal glands are unremarkable. The kidneys, visualized ureters, and urinary bladder appear unremarkable. Stomach/Bowel: There is no bowel obstruction or active inflammation. The appendix is normal. Vascular/Lymphatic: The abdominal aorta and IVC are unremarkable. No portal venous gas. There is no adenopathy. Reproductive: The uterus is anteverted and grossly unremarkable. No adnexal masses. Other: None Musculoskeletal: No acute or significant osseous findings. IMPRESSION: 1. No acute intra-abdominal or pelvic pathology. Normal appendix. 2. Cholelithiasis. Electronically Signed   By: Elgie Collard M.D.   On: 03/18/2023 19:56    Recent Labs: Lab Results  Component Value Date   WBC 4.2 03/18/2023   HGB 10.2 (L) 03/18/2023   PLT 177 03/18/2023   NA 135 03/18/2023   K 3.5 03/18/2023   CL 105 03/18/2023   CO2 23 03/18/2023   GLUCOSE 85 03/18/2023   BUN 12 03/18/2023   CREATININE 0.92 03/18/2023   BILITOT 0.5 03/18/2023   ALKPHOS 61 03/18/2023   AST 13 (L) 03/18/2023   ALT 8 03/18/2023   PROT 6.9 03/18/2023   ALBUMIN 3.4 (L) 03/18/2023   CALCIUM 8.5 (L) 03/18/2023   GFRAA 82 05/22/2020   QFTBGOLDPLUS Negative 08/21/2017    October 10, 2021 ANA 1: 640 NH positive, RNP> 8.0, chromatin antibody 1.6, dsDNA negative, Smith negative, SCL 70 negative, SSA negative, SSB negative, Jo 1 negative, p-ANCA negative, c-ANCA negative, MPO negative, PR-3 negative, CRP 20, sed rate 57, RF negative,  CK 63  November 14, 2022 HSV IgG positive, HSV IgG negative, HIV nonreactive, RPR nonreactive, hepatitis panel including hepatitis A, B, and C negative  Speciality Comments: No specialty comments available.     Procedures:  No procedures performed Allergies: Naproxen sodium, Tylenol [acetaminophen], and Other   Assessment / Plan:     Visit Diagnoses: Rash -Dannia, who works as a Publishing rights manager stands for several hours all day.  She has noticed a rash on her bilateral lower extremities since 2019.  She states the rash is not as prominent as it used to  be in the past.  She showed me some pictures on her iPhone where she had larger petechiae.  On examination today she had fine petechiae on bilateral lower extremities.  In October 10, 2021 skin biopsy-focal mild nonspecific perivascular inflammation in the dermis.  Negative for vasculitis.  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  She also had varicose veins on examination.  Use of compression socks on a regular basis were emphasized.  Frequently moving around and at work was also advised.  Regular walking will help with the circulation.  I will also refer her to dermatology for evaluation.  Positive ANA (antinuclear antibody) - Positive ANA, positive RNP, elevated sedimentation rate and elevated CRP. -She had positive ANA and positive RNP in the past.  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  She was given a prescription for Plaquenil which she took for 2 months and stopped.  She denies any history of oral ulcers, nasal ulcers, malar rash, photosensitivity, Raynaud's, lymphadenopathy.  She gives history of a stiffness in her [place] at nighttime but no morning stiffness.  No synovitis was noted on the examination.  No nailbed capillary changes or telangiectasia were noted.  She had good capillary refill.  I will repeat labs today.  Association of RNP with mixed  connective tissue disease was discussed.  She has no clinical findings of mixed connective tissue disease.  Plan: Sedimentation rate, ANA, Anti-scleroderma antibody, Sjogrens syndrome-A extractable nuclear antibody, Anti-Smith antibody, RNP Antibody, Sjogrens syndrome-B extractable nuclear antibody, Anti-DNA antibody, double-stranded, C3 and C4, Beta-2 glycoprotein antibodies, Cardiolipin antibodies, IgG, IgM, IgA  Stiffness of [place]-she gets stiffness in her [place]ajan at night after working all day.  She states the discomfort goes away when she gets up in the morning.  She has no morning stiffness.  No synovitis was noted on the examination today.  A handout on hand exercises was provided.  Joint protection was discussed.  Primary hypertension-her blood pressure was elevated at 162/100 today.  Repeat blood pressure was 145/98.  Patient states she takes her blood pressure medication at nighttime and does not recall if she took her blood pressure medication.  I advised her to monitor blood pressure closely and follow-up with the PCP.  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  She gets cold sores on her lips since she was young.  Other iron deficiency anemia-she continues to be anemic.  Hemoglobin was 10.2 on March 18, 2023.  Patient states she had uterine ablation.  Some improvement in anemia was noted.  Chronic constipation  Pseudotumor cerebri - in her 16s  Gastroesophageal reflux disease without esophagitis  Depression, major, single episode, moderate (HCC)-she takes Wellbutrin.  Gallstones-recent diagnosis.  Orders: Orders Placed This Encounter  Procedures   Sedimentation rate   ANA   Anti-scleroderma antibody   Sjogrens syndrome-A extractable nuclear antibody   Anti-Smith antibody   RNP Antibody   Sjogrens syndrome-B extractable nuclear antibody   Anti-DNA antibody, double-stranded   C3 and C4   Beta-2 glycoprotein antibodies   Cardiolipin antibodies, IgG, IgM, IgA   No  orders of the defined types were placed in this encounter.    Follow-Up Instructions: Return for Positive ANA, rash.   Pollyann Savoy, MD  Note - This record has been created using Animal nutritionist.  Chart creation errors have been sought, but may not always  have been located. Such creation errors do not reflect on  the standard of medical care.

## 2023-04-09 ENCOUNTER — Encounter: Payer: Self-pay | Admitting: Rheumatology

## 2023-04-09 ENCOUNTER — Ambulatory Visit: Payer: 59 | Attending: Rheumatology | Admitting: Rheumatology

## 2023-04-09 VITALS — BP 162/100 | HR 81 | Resp 16 | Ht 67.5 in | Wt 185.4 lb

## 2023-04-09 DIAGNOSIS — D508 Other iron deficiency anemias: Secondary | ICD-10-CM

## 2023-04-09 DIAGNOSIS — D509 Iron deficiency anemia, unspecified: Secondary | ICD-10-CM

## 2023-04-09 DIAGNOSIS — K5909 Other constipation: Secondary | ICD-10-CM

## 2023-04-09 DIAGNOSIS — R768 Other specified abnormal immunological findings in serum: Secondary | ICD-10-CM | POA: Diagnosis not present

## 2023-04-09 DIAGNOSIS — K219 Gastro-esophageal reflux disease without esophagitis: Secondary | ICD-10-CM

## 2023-04-09 DIAGNOSIS — B001 Herpesviral vesicular dermatitis: Secondary | ICD-10-CM

## 2023-04-09 DIAGNOSIS — I10 Essential (primary) hypertension: Secondary | ICD-10-CM | POA: Diagnosis not present

## 2023-04-09 DIAGNOSIS — F321 Major depressive disorder, single episode, moderate: Secondary | ICD-10-CM

## 2023-04-09 DIAGNOSIS — R21 Rash and other nonspecific skin eruption: Secondary | ICD-10-CM

## 2023-04-09 DIAGNOSIS — D500 Iron deficiency anemia secondary to blood loss (chronic): Secondary | ICD-10-CM

## 2023-04-09 DIAGNOSIS — K8020 Calculus of gallbladder without cholecystitis without obstruction: Secondary | ICD-10-CM

## 2023-04-09 DIAGNOSIS — M25649 Stiffness of unspecified hand, not elsewhere classified: Secondary | ICD-10-CM

## 2023-04-09 DIAGNOSIS — G932 Benign intracranial hypertension: Secondary | ICD-10-CM

## 2023-04-09 DIAGNOSIS — R011 Cardiac murmur, unspecified: Secondary | ICD-10-CM

## 2023-04-09 NOTE — Addendum Note (Signed)
Addended by: Ellen Henri on: 04/09/2023 09:33 AM   Modules accepted: Orders

## 2023-04-09 NOTE — Patient Instructions (Signed)
Hand Exercises Hand exercises can be helpful for almost anyone. They can strengthen your Radde and improve flexibility and movement. The exercises can also increase blood flow to the [place]. These results can make your work and daily tasks easier for you. Hand exercises can be especially helpful for people who have joint pain from arthritis or nerve damage from using their [place] over and over. These exercises can also help people who injure a hand. Exercises Most of these hand exercises are gentle stretching and motion exercises. It is usually safe to do them often throughout the day. Warming up your [place] before exercise may help reduce stiffness. You can do this with gentle massage or by placing your Rann in warm water for 10-15 minutes. It is normal to feel some stretching, pulling, tightness, or mild discomfort when you begin new exercises. In time, this will improve. Remember to always be careful and stop right away if you feel sudden, very bad pain or your pain gets worse. You want to get better and be safe. Ask your health care provider which exercises are safe for you. Do exercises exactly as told by your provider and adjust them as told. Do not begin these exercises until told by your provider. Knuckle bend or "claw" fist  Stand or sit with your arm, hand, and all five fingers pointed straight up. Make sure to keep your wrist straight. Gently bend your fingers down toward your palm until the tips of your fingers are touching your palm. Keep your big knuckle straight and only bend the small knuckles in your fingers. Hold this position for 10 seconds. Straighten your fingers back to your starting position. Repeat this exercise 5-10 times with each hand. Full finger fist  Stand or sit with your arm, hand, and all five fingers pointed straight up. Make sure to keep your wrist straight. Gently bend your fingers into your palm until the tips of your fingers are touching the middle of your  palm. Hold this position for 10 seconds. Extend your fingers back to your starting position, stretching every joint fully. Repeat this exercise 5-10 times with each hand. Straight fist  Stand or sit with your arm, hand, and all five fingers pointed straight up. Make sure to keep your wrist straight. Gently bend your fingers at the big knuckle, where your fingers meet your hand, and at the middle knuckle. Keep the knuckle at the tips of your fingers straight and try to touch the bottom of your palm. Hold this position for 10 seconds. Extend your fingers back to your starting position, stretching every joint fully. Repeat this exercise 5-10 times with each hand. Tabletop  Stand or sit with your arm, hand, and all five fingers pointed straight up. Make sure to keep your wrist straight. Gently bend your fingers at the big knuckle, where your fingers meet your hand, as far down as you can. Keep the small knuckles in your fingers straight. Think of forming a tabletop with your fingers. Hold this position for 10 seconds. Extend your fingers back to your starting position, stretching every joint fully. Repeat this exercise 5-10 times with each hand. Finger spread  Place your hand flat on a table with your palm facing down. Make sure your wrist stays straight. Spread your fingers and thumb apart from each other as far as you can until you feel a gentle stretch. Hold this position for 10 seconds. Bring your fingers and thumb tight together again. Hold this position for 10 seconds. Repeat  this exercise 5-10 times with each hand. Making circles  Stand or sit with your arm, hand, and all five fingers pointed straight up. Make sure to keep your wrist straight. Make a circle by touching the tip of your thumb to the tip of your index finger. Hold for 10 seconds. Then open your hand wide. Repeat this motion with your thumb and each of your fingers. Repeat this exercise 5-10 times with each hand. Thumb  motion  Sit with your forearm resting on a table and your wrist straight. Your thumb should be facing up toward the ceiling. Keep your fingers relaxed as you move your thumb. Lift your thumb up as high as you can toward the ceiling. Hold for 10 seconds. Bend your thumb across your palm as far as you can, reaching the tip of your thumb for the small finger (pinkie) side of your palm. Hold for 10 seconds. Repeat this exercise 5-10 times with each hand. Grip strengthening  Hold a stress ball or other soft ball in the middle of your hand. Slowly increase the pressure, squeezing the ball as much as you can without causing pain. Think of bringing the tips of your fingers into the middle of your palm. All of your finger joints should bend when doing this exercise. Hold your squeeze for 10 seconds, then relax. Repeat this exercise 5-10 times with each hand. Contact a health care provider if: Your hand pain or discomfort gets much worse when you do an exercise. Your hand pain or discomfort does not improve within 2 hours after you exercise. If you have either of these problems, stop doing these exercises right away. Do not do them again unless your provider says that you can. Get help right away if: You develop sudden, severe hand pain or swelling. If this happens, stop doing these exercises right away. Do not do them again unless your provider says that you can. This information is not intended to replace advice given to you by your health care provider. Make sure you discuss any questions you have with your health care provider. Document Revised: 07/22/2022 Document Reviewed: 07/22/2022 Elsevier Patient Education  2024 [employer].

## 2023-04-10 ENCOUNTER — Telehealth: Payer: 59 | Admitting: Physician Assistant

## 2023-04-10 ENCOUNTER — Other Ambulatory Visit (HOSPITAL_COMMUNITY): Payer: Self-pay

## 2023-04-10 DIAGNOSIS — B9689 Other specified bacterial agents as the cause of diseases classified elsewhere: Secondary | ICD-10-CM

## 2023-04-10 DIAGNOSIS — N760 Acute vaginitis: Secondary | ICD-10-CM | POA: Diagnosis not present

## 2023-04-10 MED ORDER — METRONIDAZOLE 500 MG PO TABS
500.0000 mg | ORAL_TABLET | Freq: Two times a day (BID) | ORAL | 0 refills | Status: AC
Start: 2023-04-10 — End: 2023-04-17
  Filled 2023-04-10: qty 14, 7d supply, fill #0

## 2023-04-10 NOTE — Progress Notes (Signed)

## 2023-04-13 LAB — CARDIOLIPIN ANTIBODIES, IGG, IGM, IGA
Anticardiolipin IgA: <2 APL-U/mL (ref <20.0)
Anticardiolipin IgG: <2 GPL-U/mL (ref <20.0)
Anticardiolipin IgM: <2 MPL-U/mL (ref <20.0)

## 2023-04-13 LAB — ANTI-DNA ANTIBODY, DOUBLE-STRANDED: ds DNA Ab: <1 IU/mL

## 2023-04-13 LAB — ANTI-SMITH ANTIBODY: ENA SM Ab Ser-aCnc: <1 AI

## 2023-04-13 LAB — ANA: Anti Nuclear Antibody (ANA): POSITIVE — AB

## 2023-04-13 LAB — RNP ANTIBODY: Ribonucleic Protein(ENA) Antibody, IgG: >8 AI — AB

## 2023-04-13 LAB — ANTI-SCLERODERMA ANTIBODY: Scleroderma (Scl-70) (ENA) Antibody, IgG: <1 AI

## 2023-04-13 LAB — C3 AND C4
C3 Complement: 136 mg/dL (ref 83–193)
C4 Complement: 37 mg/dL (ref 15–57)

## 2023-04-13 LAB — BETA-2 GLYCOPROTEIN ANTIBODIES
Beta-2 Glyco 1 IgA: <2 U/mL (ref <20.0)
Beta-2 Glyco 1 IgM: <2 U/mL (ref <20.0)
Beta-2 Glyco I IgG: <2 U/mL (ref <20.0)

## 2023-04-13 LAB — SJOGRENS SYNDROME-A EXTRACTABLE NUCLEAR ANTIBODY: SSA (Ro) (ENA) Antibody, IgG: <1 AI

## 2023-04-13 LAB — SEDIMENTATION RATE: Sed Rate: 28 mm/h — ABNORMAL HIGH (ref 0–20)

## 2023-04-13 LAB — ANTI-NUCLEAR AB-TITER (ANA TITER): ANA Titer 1: 1:1280 {titer} — ABNORMAL HIGH

## 2023-04-13 LAB — SJOGRENS SYNDROME-B EXTRACTABLE NUCLEAR ANTIBODY: SSB (La) (ENA) Antibody, IgG: <1 AI

## 2023-04-13 NOTE — Progress Notes (Signed)
Positive ANA, positive RNP and mildly elevated sedimentation rate (these labs are positive in the past).  Rest the labs were unremarkable.  I will discuss results at the follow-up visit.  Awaiting evaluation by the dermatologist.

## 2023-04-24 ENCOUNTER — Encounter: Payer: Commercial Managed Care - PPO | Admitting: Rheumatology

## 2023-05-08 NOTE — Progress Notes (Signed)
Office Visit Note  Patient: Rachel Salas             Date of Birth: 02-May-1976           MRN: [id number]             PCP: Dorcas Carrow, DO Referring: Dorcas Carrow, DO Visit Date: 05/22/2023 Occupation: @GUAROCC @  Subjective:  Positive ANA and joint stiffness   History of Present Illness: Rachel Salas is a 47 y.o. female returns today after her initial visit on April 09, 2023.  She states she has been experiencing some stiffness in her [place] towards the end of the day.  She states the rash on her lower extremities is unchanged.  None of the other joints are painful.  She denies any history of oral ulcers, nasal ulcers, malar rash, photosensitivity, Raynaud's or lymphadenopathy.    Activities of Daily Living:  Patient reports morning stiffness for 0 minute.   Patient Denies nocturnal pain.  Difficulty dressing/grooming: Denies Difficulty climbing stairs: Denies Difficulty getting out of chair: Denies Difficulty using Cappella for taps, buttons, cutlery, and/or writing: Denies  Review of Systems  Constitutional:  Negative for fatigue.  HENT:  Negative for mouth sores and mouth dryness.   Eyes:  Negative for dryness.  Respiratory:  Negative for shortness of breath.   Cardiovascular:  Negative for chest pain and palpitations.  Gastrointestinal:  Positive for constipation. Negative for blood in stool and diarrhea.  Endocrine: Negative for increased urination.  Genitourinary:  Negative for involuntary urination.  Musculoskeletal:  Positive for joint pain and joint pain. Negative for gait problem, joint swelling, myalgias, muscle weakness, morning stiffness, muscle tenderness and myalgias.  Skin:  Negative for color change, rash, hair loss and sensitivity to sunlight.  Allergic/Immunologic: Negative for susceptible to infections.  Neurological:  Negative for dizziness and headaches.  Hematological:  Negative for swollen glands.  Psychiatric/Behavioral:  Negative for  depressed mood and sleep disturbance. The patient is not nervous/anxious.     PMFS History:  Patient Active Problem List   Diagnosis Date Noted   Lung nodule < 6cm on CT 11/14/2019   Depression, major, single episode, moderate (HCC) 10/17/2019   HTN (hypertension) 04/15/2019   Recurrent cold sores 11/22/2018   Iron deficiency anemia due to chronic blood loss 07/02/2018   Allergic reaction 05/04/2015   Snoring 12/19/2014   History of morbid obesity 07/30/2014   Heavy periods 07/30/2014   Hypokalemia 07/26/2014   Undiagnosed cardiac murmurs 05/21/2014   GERD (gastroesophageal reflux disease) 10/14/2012   Pseudotumor cerebri 10/14/2012   Chronic constipation 10/14/2012   Anemia 10/14/2012    Past Medical History:  Diagnosis Date   Adenomyosis 11/15/2014   u/s suspicious for adenomyosis   Essential hypertension, benign 10/14/2012   GERD (gastroesophageal reflux disease)    Hypertension    Hypokalemia    IDA (iron deficiency anemia)    Menorrhagia with irregular cycle    Pseudotumor cerebri dx 2007   Severe obesity (HCC)     Family History  Problem Relation Age of Onset   Hypertension Mother    Hypercholesterolemia Mother    Thyroid disease Mother    Healthy Brother    Stroke Maternal Grandmother    Healthy Daughter    Healthy Son    Healthy Son    Colon cancer Neg Hx    Breast cancer Neg Hx    Ovarian cancer Neg Hx    Heart disease Neg Hx    Past  Surgical History:  Procedure Laterality Date   TONSILLECTOMY  2007   TUBAL LIGATION  2002   Social History   Social History Narrative   2-3 caffeine drinks a week       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 No smoking; occasional alcohol.    Immunization History  Administered Date(s) Administered   HPV 9-valent 11/20/2017, 01/28/2018, 06/10/2018   Influenza Split 04/28/2014, 03/31/2016   Influenza,inj,Quad PF,6+ Mos 04/15/2019, 04/08/2021   Influenza-Unspecified 04/09/2017, 04/20/2018, 05/18/2020    PFIZER(Purple Top)SARS-COV-2 Vaccination 08/02/2019, 08/22/2019   PPD Test 08/21/2018   Tdap 09/29/2013     Objective: Vital Signs: BP (!) 148/107 (BP Location: Right Arm, Patient Position: Sitting, Cuff Size: Normal)   Pulse 83   Resp 14   Ht 5\' 7"  (1.702 m)   Wt 189 lb (85.7 kg)   LMP 05/01/2023   BMI 29.60 kg/m    Physical Exam Vitals and nursing note reviewed.  Constitutional:      Appearance: She is well-developed.  HENT:     Head: Normocephalic and atraumatic.  Eyes:     Conjunctiva/sclera: Conjunctivae normal.  Cardiovascular:     Rate and Rhythm: Normal rate and regular rhythm.     Heart sounds: Normal heart sounds.  Pulmonary:     Effort: Pulmonary effort is normal.     Breath sounds: Normal breath sounds.  Abdominal:     General: Bowel sounds are normal.     Palpations: Abdomen is soft.  Musculoskeletal:     Cervical back: Normal range of motion.  Lymphadenopathy:     Cervical: No cervical adenopathy.  Skin:    General: Skin is warm and dry.     Capillary Refill: Capillary refill takes less than 2 seconds.     Comments: No sclerodactyly, nailbed capillary changes were noted.  Fine petechiae was noted on the lower extremities.  Neurological:     Mental Status: She is alert and oriented to person, place, and time.  Psychiatric:        Behavior: Behavior normal.      Musculoskeletal Exam: Cervical, thoracic and lumbar spine with good range of motion.  Shoulder joints, elbow joints, wrist joints, MCPs PIPs and DIPs with good range of motion with no synovitis.  Hip joints, knee joints, ankles, MTPs and PIPs with good range of motion with no synovitis.  CDAI Exam: CDAI Score: -- Patient Global: --; Provider Global: -- Swollen: --; Tender: -- Joint Exam 05/22/2023   No joint exam has been documented for this visit   There is currently no information documented on the homunculus. Go to the Rheumatology activity and complete the homunculus joint  exam.  Investigation: No additional findings.  Imaging: No results found.  Recent Labs: Lab Results  Component Value Date   WBC 4.2 03/18/2023   HGB 10.2 (L) 03/18/2023   PLT 177 03/18/2023   NA 135 03/18/2023   K 3.5 03/18/2023   CL 105 03/18/2023   CO2 23 03/18/2023   GLUCOSE 85 03/18/2023   BUN 12 03/18/2023   CREATININE 0.92 03/18/2023   BILITOT 0.5 03/18/2023   ALKPHOS 61 03/18/2023   AST 13 (L) 03/18/2023   ALT 8 03/18/2023   PROT 6.9 03/18/2023   ALBUMIN 3.4 (L) 03/18/2023   CALCIUM 8.5 (L) 03/18/2023   GFRAA 82 05/22/2020   QFTBGOLDPLUS Negative 08/21/2017   April 09, 2023 ESR 28, ANA 1: 1280 NH, RNP> 8.0, SCL 70 negative, SSA negative, SS B-, Smith negative,  dsDNA negative, C3-C4 normal, beta-2 GP 1 negative, anticardiolipin negative   Speciality Comments: No specialty comments available.  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  Use of compression socks was discussed.  Positive ANA (antinuclear antibody) -April 09, 2023 ESR 28, ANA 1: 1280 NH, RNP> 8.0, SCL 70 negative, SSA negative, SS B-, Smith negative, dsDNA negative, C3-C4 normal, beta-2 GP 1 negative, anticardiolipin negative.  Lab results were discussed with the patient.  She has positive ANA, positive RNP, elevated sedimentation rate.  No clinical features of autoimmune disease at this time.  Patient states she has had positive ANA RNP for many years.  She was also given Plaquenil in the past by her previous rheumatologist which she took for couple of years without any difference in her symptoms.  I advised her to contact me if she develops any new symptoms.  Recheck labs in 1 year.- Plan: Protein / creatinine ratio, urine, CBC with Differential/Platelet, COMPLETE  METABOLIC PANEL WITH GFR, ANA, Anti-DNA antibody, double-stranded, C3 and C4, Sedimentation rate, RNP Antibody  Stiffness of hand joint, unspecified laterality -she gives history of pain in bilateral [place] towards the end of the day.  There is no history of morning stiffness.  No synovitis was noted on the examination.  A handout on exercises was provided at the last visit.  Primary hypertension-blood pressure was elevated at 148/107.  She was advised to monitor blood pressure closely and follow-up with the PCP.  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  Other iron deficiency anemia  Chronic constipation  Gastroesophageal reflux disease without esophagitis  Gallstones  Pseudotumor cerebri  Depression, major, single episode, moderate (HCC)  Orders: Orders Placed This Encounter  Procedures   Protein / creatinine ratio, urine   CBC with Differential/Platelet   COMPLETE METABOLIC PANEL WITH GFR   ANA   Anti-DNA antibody, double-stranded   C3 and C4   Sedimentation rate   RNP Antibody   No orders of the defined types were placed in this encounter.    Follow-Up Instructions: Return in about 1 year (around 05/21/2024) for +ANA, +RNP.   Pollyann Savoy, MD  Note - This record has been created using Animal nutritionist.  Chart creation errors have been sought, but may not always  have been located. Such creation errors do not reflect on  the standard of medical care.

## 2023-05-14 ENCOUNTER — Telehealth: Payer: Self-pay

## 2023-05-14 NOTE — Telephone Encounter (Signed)
Pt requesting call back to reschedule colonoscopy

## 2023-05-15 NOTE — Telephone Encounter (Signed)
Spoken to patient and we could not find a date that would for patient before she starts her new job.  Will cancel as requested. Called endo unit to make the change.  Patient stated that she will call if anything changes.

## 2023-05-22 ENCOUNTER — Encounter: Payer: Self-pay | Admitting: Rheumatology

## 2023-05-22 ENCOUNTER — Ambulatory Visit: Payer: 59 | Attending: Rheumatology | Admitting: Rheumatology

## 2023-05-22 ENCOUNTER — Ambulatory Visit: Admit: 2023-05-22 | Payer: 59 | Admitting: Gastroenterology

## 2023-05-22 VITALS — BP 148/107 | HR 83 | Resp 14 | Ht 67.0 in | Wt 189.0 lb

## 2023-05-22 DIAGNOSIS — R768 Other specified abnormal immunological findings in serum: Secondary | ICD-10-CM

## 2023-05-22 DIAGNOSIS — B001 Herpesviral vesicular dermatitis: Secondary | ICD-10-CM

## 2023-05-22 DIAGNOSIS — D508 Other iron deficiency anemias: Secondary | ICD-10-CM

## 2023-05-22 DIAGNOSIS — M25649 Stiffness of unspecified hand, not elsewhere classified: Secondary | ICD-10-CM | POA: Diagnosis not present

## 2023-05-22 DIAGNOSIS — I10 Essential (primary) hypertension: Secondary | ICD-10-CM | POA: Diagnosis not present

## 2023-05-22 DIAGNOSIS — K5909 Other constipation: Secondary | ICD-10-CM

## 2023-05-22 DIAGNOSIS — K219 Gastro-esophageal reflux disease without esophagitis: Secondary | ICD-10-CM

## 2023-05-22 DIAGNOSIS — L817 Pigmented purpuric dermatosis: Secondary | ICD-10-CM | POA: Diagnosis not present

## 2023-05-22 DIAGNOSIS — K8020 Calculus of gallbladder without cholecystitis without obstruction: Secondary | ICD-10-CM

## 2023-05-22 DIAGNOSIS — G932 Benign intracranial hypertension: Secondary | ICD-10-CM

## 2023-05-22 DIAGNOSIS — F321 Major depressive disorder, single episode, moderate: Secondary | ICD-10-CM

## 2023-05-22 SURGERY — COLONOSCOPY WITH PROPOFOL
Anesthesia: General

## 2023-05-29 ENCOUNTER — Other Ambulatory Visit (HOSPITAL_COMMUNITY): Payer: Self-pay

## 2023-05-29 ENCOUNTER — Telehealth (INDEPENDENT_AMBULATORY_CARE_PROVIDER_SITE_OTHER): Payer: 59 | Admitting: Family Medicine

## 2023-05-29 ENCOUNTER — Encounter (HOSPITAL_COMMUNITY): Payer: Self-pay

## 2023-05-29 ENCOUNTER — Encounter: Payer: Self-pay | Admitting: Family Medicine

## 2023-05-29 VITALS — BP 137/92 | HR 75

## 2023-05-29 DIAGNOSIS — I10 Essential (primary) hypertension: Secondary | ICD-10-CM | POA: Diagnosis not present

## 2023-05-29 MED ORDER — METOPROLOL SUCCINATE ER 25 MG PO TB24
25.0000 mg | ORAL_TABLET | Freq: Every day | ORAL | 0 refills | Status: DC
  Filled 2023-05-29: qty 90, 90d supply, fill #0
  Filled 2023-06-24: qty 30, 30d supply, fill #0

## 2023-05-29 MED ORDER — WEGOVY 2.4 MG/0.75ML ~~LOC~~ SOAJ
2.4000 mg | SUBCUTANEOUS | 0 refills | Status: DC
  Filled 2023-05-29: qty 3, 28d supply, fill #0

## 2023-05-29 MED ORDER — LOSARTAN POTASSIUM 50 MG PO TABS
75.0000 mg | ORAL_TABLET | Freq: Every day | ORAL | 0 refills | Status: DC
  Filled 2023-05-29: qty 135, 90d supply, fill #0

## 2023-05-29 NOTE — Progress Notes (Signed)
Called and scheduled patient on 07/08/2023 @ 8:20 am.

## 2023-05-29 NOTE — Progress Notes (Signed)
BP (!) 137/92   Pulse 75   LMP 05/01/2023    Subjective:    Patient ID: Rachel Salas, female    DOB: May 31, 1976, 47 y.o.   MRN: [id number]  HPI: Rachel Salas is a 47 y.o. female  Chief Complaint  Patient presents with   Hypertension    Patient says she has noticed her Blood Pressure readings have been higher, such 150s/100s. Patient says she first noticed it Rheumatologist about a month ago and her reading was elevated. Patient says she recently went for another appointment and it was elevated. Patient says this morning it was fine, but she wanted to discuss with provider.    HYPERTENSION  Hypertension status: stable  Satisfied with current treatment? no Duration of hypertension: chronic BP monitoring frequency:  a few times a month BP range: 150s/100s BP medication side effects:  no Medication compliance: excellent compliance Previous BP meds: losartan, metoprolol Aspirin: no Recurrent headaches: no Visual changes: no Palpitations: no Dyspnea: no Chest pain: no Lower extremity edema: no Dizzy/lightheaded: no  Relevant past medical, surgical, family and social history reviewed and updated as indicated. Interim medical history since our last visit reviewed. Allergies and medications reviewed and updated.  Review of Systems  Constitutional: Negative.   Respiratory: Negative.    Cardiovascular: Negative.   Gastrointestinal: Negative.   Musculoskeletal: Negative.   Psychiatric/Behavioral: Negative.      Per HPI unless specifically indicated above     Objective:    BP (!) 137/92   Pulse 75   LMP 05/01/2023   Wt Readings from Last 3 Encounters:  05/22/23 189 lb (85.7 kg)  04/09/23 185 lb 6.4 oz (84.1 kg)  03/18/23 181 lb (82.1 kg)    Physical Exam Vitals and nursing note reviewed.  Constitutional:      General: She is not in acute distress.    Appearance: Normal appearance. She is not ill-appearing, toxic-appearing or diaphoretic.  HENT:     Head:  Normocephalic and atraumatic.     Right Ear: External ear normal.     Left Ear: External ear normal.     Nose: Nose normal.     Mouth/Throat:     Mouth: Mucous membranes are moist.     Pharynx: Oropharynx is clear.  Eyes:     General: No scleral icterus.       Right eye: No discharge.        Left eye: No discharge.     Conjunctiva/sclera: Conjunctivae normal.     Pupils: Pupils are equal, round, and reactive to light.  Pulmonary:     Effort: Pulmonary effort is normal. No respiratory distress.     Comments: Speaking in full sentences Musculoskeletal:        General: Normal range of motion.     Cervical back: Normal range of motion.  Skin:    Coloration: Skin is not jaundiced or pale.     Findings: No bruising, erythema, lesion or rash.  Neurological:     Mental Status: She is alert and oriented to person, place, and time. Mental status is at baseline.  Psychiatric:        Mood and Affect: Mood normal.        Behavior: Behavior normal.        Thought Content: Thought content normal.        Judgment: Judgment normal.     Results for orders placed or performed in visit on 04/09/23  Sedimentation rate  Result Value Ref  Range   Sed Rate 28 (H) 0 - 20 mm/h  ANA  Result Value Ref Range   Anti Nuclear Antibody (ANA) POSITIVE (A) NEGATIVE  Anti-scleroderma antibody  Result Value Ref Range   Scleroderma (Scl-70) (ENA) Antibody, IgG <1.0 NEG <1.0 NEG AI  Sjogrens syndrome-A extractable nuclear antibody  Result Value Ref Range   SSA (Ro) (ENA) Antibody, IgG <1.0 NEG <1.0 NEG AI  Anti-Smith antibody  Result Value Ref Range   ENA SM Ab Ser-aCnc <1.0 NEG <1.0 NEG AI  RNP Antibody  Result Value Ref Range   Ribonucleic Protein(ENA) Antibody, IgG >8.0 POS (A) <1.0 NEG AI  Sjogrens syndrome-B extractable nuclear antibody  Result Value Ref Range   SSB (La) (ENA) Antibody, IgG <1.0 NEG <1.0 NEG AI  Anti-DNA antibody, double-stranded  Result Value Ref Range   ds DNA Ab <1 IU/mL  C3  and C4  Result Value Ref Range   C3 Complement 136 83 - 193 mg/dL   C4 Complement 37 15 - 57 mg/dL  Beta-2 glycoprotein antibodies  Result Value Ref Range   Beta-2 Glyco I IgG <2.0 <20.0 U/mL   Beta-2 Glyco 1 IgM <2.0 <20.0 U/mL   Beta-2 Glyco 1 IgA <2.0 <20.0 U/mL  Cardiolipin antibodies, IgG, IgM, IgA  Result Value Ref Range   Anticardiolipin IgA <2.0 <20.0 APL-U/mL   Anticardiolipin IgG <2.0 <20.0 GPL-U/mL   Anticardiolipin IgM <2.0 <20.0 MPL-U/mL  Anti-nuclear ab-titer (ANA titer)  Result Value Ref Range   ANA Titer 1 1:1,280 (H) titer   ANA Pattern 1 Nuclear, Homogeneous (A)       Assessment & Plan:   Problem List Items Addressed This Visit       Cardiovascular and Mediastinum   HTN (hypertension) - Primary    Running high. Will increase her losartan to 75mg  and recheck in 6 weeks at physical. Call with any concerns.       Relevant Medications   losartan (COZAAR) 50 MG tablet   metoprolol succinate (TOPROL-XL) 25 MG 24 hr tablet     Follow up plan: Return in about 6 weeks (around 07/10/2023) for physical.    This visit was completed via video visit through MyChart due to the restrictions of the COVID-19 pandemic. All issues as above were discussed and addressed. Physical exam was done as above through visual confirmation on video through MyChart. If it was felt that the patient should be evaluated in the office, they were directed there. The patient verbally consented to this visit. Location of the patient: home Location of the provider: work Those involved with this call:  Provider: Olevia Perches, DO CMA: Malen Gauze, CMA Front Desk/Registration: Servando Snare  Time spent on call:  15 minutes with patient face to face via video conference. More than 50% of this time was spent in counseling and coordination of care. 23 minutes total spent in review of patient's record and preparation of their chart.

## 2023-05-29 NOTE — Assessment & Plan Note (Signed)
Running high. Will increase her losartan to 75mg  and recheck in 6 weeks at physical. Call with any concerns.

## 2023-06-09 ENCOUNTER — Other Ambulatory Visit: Payer: Self-pay

## 2023-06-09 ENCOUNTER — Other Ambulatory Visit (HOSPITAL_COMMUNITY): Payer: Self-pay

## 2023-06-16 ENCOUNTER — Encounter: Payer: Self-pay | Admitting: Internal Medicine

## 2023-06-16 ENCOUNTER — Telehealth: Payer: Self-pay | Admitting: Physician Assistant

## 2023-06-16 DIAGNOSIS — R3989 Other symptoms and signs involving the genitourinary system: Secondary | ICD-10-CM

## 2023-06-17 ENCOUNTER — Encounter: Payer: Self-pay | Admitting: Internal Medicine

## 2023-06-17 ENCOUNTER — Other Ambulatory Visit (HOSPITAL_COMMUNITY): Payer: Self-pay

## 2023-06-17 MED ORDER — FLUCONAZOLE 150 MG PO TABS
150.0000 mg | ORAL_TABLET | Freq: Every day | ORAL | 0 refills | Status: DC
  Filled 2023-06-17: qty 1, 1d supply, fill #0

## 2023-06-17 MED ORDER — CEPHALEXIN 500 MG PO CAPS
500.0000 mg | ORAL_CAPSULE | Freq: Two times a day (BID) | ORAL | 0 refills | Status: AC
  Filled 2023-06-17: qty 14, 7d supply, fill #0

## 2023-06-17 NOTE — Progress Notes (Signed)
I have spent 5 minutes in review of e-visit questionnaire, review and updating patient chart, medical decision making and response to patient.   Mia Milan Cody Jacklynn Dehaas, PA-C    

## 2023-06-17 NOTE — Addendum Note (Signed)
Addended by: Waldon Merl on: 06/17/2023 07:44 AM   Modules accepted: Orders

## 2023-06-17 NOTE — Progress Notes (Signed)

## 2023-06-24 ENCOUNTER — Other Ambulatory Visit (HOSPITAL_COMMUNITY): Payer: Self-pay

## 2023-06-24 ENCOUNTER — Encounter: Payer: Self-pay | Admitting: Internal Medicine

## 2023-06-29 ENCOUNTER — Encounter: Payer: Self-pay | Admitting: Internal Medicine

## 2023-07-01 ENCOUNTER — Encounter: Payer: Self-pay | Admitting: Internal Medicine

## 2023-07-01 LAB — HM MAMMOGRAPHY

## 2023-07-08 ENCOUNTER — Other Ambulatory Visit (HOSPITAL_COMMUNITY): Payer: Self-pay

## 2023-07-08 ENCOUNTER — Encounter: Payer: Self-pay | Admitting: Family Medicine

## 2023-07-08 ENCOUNTER — Ambulatory Visit (INDEPENDENT_AMBULATORY_CARE_PROVIDER_SITE_OTHER): Payer: Managed Care, Other (non HMO) | Admitting: Family Medicine

## 2023-07-08 ENCOUNTER — Encounter: Payer: Self-pay | Admitting: Internal Medicine

## 2023-07-08 VITALS — BP 150/90 | HR 83 | Ht 68.0 in | Wt 195.0 lb

## 2023-07-08 DIAGNOSIS — F321 Major depressive disorder, single episode, moderate: Secondary | ICD-10-CM

## 2023-07-08 DIAGNOSIS — Z8639 Personal history of other endocrine, nutritional and metabolic disease: Secondary | ICD-10-CM

## 2023-07-08 DIAGNOSIS — Z0000 Encounter for general adult medical examination without abnormal findings: Secondary | ICD-10-CM | POA: Diagnosis not present

## 2023-07-08 DIAGNOSIS — I10 Essential (primary) hypertension: Secondary | ICD-10-CM | POA: Diagnosis not present

## 2023-07-08 DIAGNOSIS — D500 Iron deficiency anemia secondary to blood loss (chronic): Secondary | ICD-10-CM

## 2023-07-08 DIAGNOSIS — Z1211 Encounter for screening for malignant neoplasm of colon: Secondary | ICD-10-CM | POA: Diagnosis not present

## 2023-07-08 LAB — MICROALBUMIN, URINE WAIVED
Creatinine, Urine Waived: 300 mg/dL (ref 10–300)
Microalb, Ur Waived: 80 mg/L — ABNORMAL HIGH (ref 0–19)

## 2023-07-08 MED ORDER — LOSARTAN POTASSIUM 100 MG PO TABS
100.0000 mg | ORAL_TABLET | Freq: Every day | ORAL | 1 refills | Status: DC
  Filled 2023-07-08: qty 30, 30d supply, fill #0
  Filled 2023-08-12: qty 30, 30d supply, fill #1
  Filled 2023-12-01: qty 30, 30d supply, fill #2

## 2023-07-08 MED ORDER — METOPROLOL SUCCINATE ER 25 MG PO TB24
25.0000 mg | ORAL_TABLET | Freq: Every day | ORAL | 1 refills | Status: DC
  Filled 2023-07-08: qty 30, 30d supply, fill #0
  Filled 2023-07-29: qty 90, 90d supply, fill #0

## 2023-07-08 MED ORDER — ONDANSETRON HCL 4 MG PO TABS
4.0000 mg | ORAL_TABLET | Freq: Three times a day (TID) | ORAL | 3 refills | Status: AC | PRN
  Filled 2023-07-08: qty 60, 20d supply, fill #0

## 2023-07-08 MED ORDER — WEGOVY 2.4 MG/0.75ML ~~LOC~~ SOAJ
2.4000 mg | SUBCUTANEOUS | 1 refills | Status: DC
  Filled 2023-07-08 – 2023-07-18 (×3): qty 3, 28d supply, fill #0

## 2023-07-08 MED ORDER — VALACYCLOVIR HCL 1 G PO TABS
2000.0000 mg | ORAL_TABLET | Freq: Two times a day (BID) | ORAL | 5 refills | Status: AC
  Filled 2023-07-08: qty 120, 30d supply, fill #0

## 2023-07-08 MED ORDER — BUPROPION HCL ER (XL) 300 MG PO TB24
300.0000 mg | ORAL_TABLET | Freq: Every day | ORAL | 1 refills | Status: DC
  Filled 2023-07-08: qty 30, 30d supply, fill #0

## 2023-07-08 NOTE — Assessment & Plan Note (Signed)
Under good control on current regimen. Continue current regimen. Continue to monitor. Call with any concerns. Refills given. Labs drawn today.   

## 2023-07-08 NOTE — Assessment & Plan Note (Signed)
Doing well on her wegovy. Refill given today.

## 2023-07-08 NOTE — Progress Notes (Signed)
BP (!) 150/90   Pulse 83   Ht 5\' 8"  (1.727 m)   Wt 195 lb (88.5 kg)   LMP 06/26/2023 (Exact Date)   SpO2 98%   BMI 29.65 kg/m    Subjective:    Patient ID: Rachel Salas, female    DOB: 27-Aug-1975, 47 y.o.   MRN: [id number]  HPI: Rachel Salas is a 47 y.o. female presenting on 07/08/2023 for comprehensive medical examination. Current medical complaints include:  HYPERTENSION  Hypertension status: uncontrolled  Satisfied with current treatment? no Duration of hypertension: chronic BP monitoring frequency:  a few times a month BP range: 140s/90s-100s BP medication side effects:  no Medication compliance: excellent compliance Previous BP meds:losartan, metoprolol Aspirin: no Recurrent headaches: no Visual changes: no Palpitations: no Dyspnea: no Chest pain: no Lower extremity edema: no Dizzy/lightheaded: no  ANEMIA Anemia status: controlled Etiology of anemia: iron def Duration of anemia treatment: chronic  Compliance with treatment: excellent compliance Iron supplementation side effects: no Severity of anemia: mild Fatigue: no Decreased exercise tolerance: no  Dyspnea on exertion: no Palpitations: no Bleeding: no Pica: no  DEPRESSION Mood status: controlled Satisfied with current treatment?: yes Symptom severity: mild  Duration of current treatment : chronic Side effects: no Medication compliance: excellent compliance Psychotherapy/counseling: no  Previous psychiatric medications: wellbutrin Depressed mood: no Anxious mood: no Anhedonia: no Significant weight loss or gain: no Insomnia: no  Fatigue: no Feelings of worthlessness or guilt: no Impaired concentration/indecisiveness: no Suicidal ideations: no Hopelessness: no Crying spells: no    07/08/2023    8:19 AM 10/17/2022    2:53 PM 04/11/2022    3:59 PM 02/28/2022    4:14 PM 11/22/2021    9:46 AM  Depression screen PHQ 2/9  Decreased Interest 0 0 0 0 0  Down, Depressed, Hopeless 0 0 0 0 0   PHQ - 2 Score 0 0 0 0 0  Altered sleeping 0 0 0 0 0  Tired, decreased energy 0 0 0 1 0  Change in appetite 0 0 0 0 0  Feeling bad or failure about yourself  0 0 0 0 0  Trouble concentrating 0 0 0 0 0  Moving slowly or fidgety/restless 0 0 0 0 0  Suicidal thoughts 0 0 0 0 0  PHQ-9 Score 0 0 0 1 0  Difficult doing work/chores Not difficult at all  Not difficult at all Not difficult at all     Menopausal Symptoms: no  Depression Screen done today and results listed below:     07/08/2023    8:19 AM 10/17/2022    2:53 PM 04/11/2022    3:59 PM 02/28/2022    4:14 PM 11/22/2021    9:46 AM  Depression screen PHQ 2/9  Decreased Interest 0 0 0 0 0  Down, Depressed, Hopeless 0 0 0 0 0  PHQ - 2 Score 0 0 0 0 0  Altered sleeping 0 0 0 0 0  Tired, decreased energy 0 0 0 1 0  Change in appetite 0 0 0 0 0  Feeling bad or failure about yourself  0 0 0 0 0  Trouble concentrating 0 0 0 0 0  Moving slowly or fidgety/restless 0 0 0 0 0  Suicidal thoughts 0 0 0 0 0  PHQ-9 Score 0 0 0 1 0  Difficult doing work/chores Not difficult at all  Not difficult at all Not difficult at all     Past Medical History:  Past Medical  History:  Diagnosis Date   Adenomyosis 11/15/2014   u/s suspicious for adenomyosis   Essential hypertension, benign 10/14/2012   GERD (gastroesophageal reflux disease)    Hypertension    Hypokalemia    IDA (iron deficiency anemia)    Menorrhagia with irregular cycle    Pseudotumor cerebri dx 2007   Severe obesity (HCC)     Surgical History:  Past Surgical History:  Procedure Laterality Date   TONSILLECTOMY  2007   TUBAL LIGATION  2002    Medications:  Current Outpatient Medications on File Prior to Visit  Medication Sig   cyclobenzaprine (FLEXERIL) 10 MG tablet Take 1 tablet (10 mg total) by mouth at bedtime if needed.   EPINEPHrine 0.3 mg/0.3 mL IJ SOAJ injection Inject 0.38mL into the muscle one time as needed for anaphylaxis   No current facility-administered  medications on file prior to visit.    Allergies:  Allergies  Allergen Reactions   Naproxen Sodium Other (See Comments)    On second dose of Aleve, pt presented to ED with severe urticaria and swelling of lips. Pt did not require Epi, but received remainder of allergic reaction treatment. --Lara Mulch, MD   Tylenol [Acetaminophen] Hives   Other Palpitations    FEREHEME- CHEST HEAVINESS AND ARM PAIN    Social History:  Social History   Socioeconomic History   Marital status: Single    Spouse name: Not on file   Number of children: 3   Years of education: BS   Highest education level: Master's degree (e.g., MA, MS, MEng, MEd, MSW, MBA)  Occupational History    Employer:   Tobacco Use   Smoking status: Never    Passive exposure: Past   Smokeless tobacco: Never  Vaping Use   Vaping status: Never Used  Substance and Sexual Activity   Alcohol use: Yes    Alcohol/week: 0.0 standard drinks of alcohol    Comment: Rare/Occasional   Drug use: No   Sexual activity: Yes    Partners: Male    Birth control/protection: Surgical, None    Comment: BTL  Other Topics Concern   Not on file  Social History Narrative   2-3 caffeine drinks a week       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 No smoking; occasional alcohol.    Social Drivers of Corporate investment banker Strain: Low Risk  (07/07/2023)   Overall Financial Resource Strain (CARDIA)    Difficulty of Paying Living Expenses: Not very hard  Food Insecurity: No Food Insecurity (07/07/2023)   Hunger Vital Sign    Worried About Running Out of Food in the Last Year: Never true    Ran Out of Food in the Last Year: Never true  Transportation Needs: No Transportation Needs (07/07/2023)   PRAPARE - Administrator, Civil Service (Medical): No    Lack of Transportation (Non-Medical): No  Physical Activity: Unknown (07/07/2023)   Exercise Vital Sign    Days of Exercise per Week: 0 days     Minutes of Exercise per Session: Not on file  Stress: No Stress Concern Present (07/07/2023)   [employer] of Occupational Health - Occupational Stress Questionnaire    Feeling of Stress : Not at all  Social Connections: Socially Isolated (07/07/2023)   Social Connection and Isolation Panel [NHANES]    Frequency of Communication with Friends and Family: More than three times a week    Frequency of Social Gatherings with Friends and  Family: Once a week    Attends Religious Services: Never    Database administrator or Organizations: No    Attends Engineer, structural: Not on file    Marital Status: Separated  Intimate Partner Violence: Not on file   Social History   Tobacco Use  Smoking Status Never   Passive exposure: Past  Smokeless Tobacco Never   Social History   Substance and Sexual Activity  Alcohol Use Yes   Alcohol/week: 0.0 standard drinks of alcohol   Comment: Rare/Occasional    Family History:  Family History  Problem Relation Age of Onset   Hypertension Mother    Hypercholesterolemia Mother    Thyroid disease Mother    Healthy Brother    Stroke Maternal Grandmother    Healthy Daughter    Healthy Son    Healthy Son    Colon cancer Neg Hx    Breast cancer Neg Hx    Ovarian cancer Neg Hx    Heart disease Neg Hx     Past medical history, surgical history, medications, allergies, family history and social history reviewed with patient today and changes made to appropriate areas of the chart.   Review of Systems  Constitutional: Negative.   HENT: Negative.    Eyes: Negative.   Respiratory: Negative.    Cardiovascular: Negative.   Gastrointestinal:  Positive for constipation. Negative for abdominal pain, blood in stool, diarrhea, heartburn, melena, nausea and vomiting.  Genitourinary: Negative.   Musculoskeletal: Negative.   Skin: Negative.   Neurological: Negative.   Endo/Heme/Allergies: Negative.   Psychiatric/Behavioral: Negative.      All other ROS negative except what is listed above and in the HPI.      Objective:    BP (!) 150/90   Pulse 83   Ht 5\' 8"  (1.727 m)   Wt 195 lb (88.5 kg)   LMP 06/26/2023 (Exact Date)   SpO2 98%   BMI 29.65 kg/m   Wt Readings from Last 3 Encounters:  07/08/23 195 lb (88.5 kg)  05/22/23 189 lb (85.7 kg)  04/09/23 185 lb 6.4 oz (84.1 kg)    Physical Exam Vitals and nursing note reviewed.  Constitutional:      General: She is not in acute distress.    Appearance: Normal appearance. She is not ill-appearing, toxic-appearing or diaphoretic.  HENT:     Head: Normocephalic and atraumatic.     Right Ear: Tympanic membrane, ear canal and external ear normal. There is no impacted cerumen.     Left Ear: Tympanic membrane, ear canal and external ear normal. There is no impacted cerumen.     Nose: Nose normal. No congestion or rhinorrhea.     Mouth/Throat:     Mouth: Mucous membranes are moist.     Pharynx: Oropharynx is clear. No oropharyngeal exudate or posterior oropharyngeal erythema.  Eyes:     General: No scleral icterus.       Right eye: No discharge.        Left eye: No discharge.     Extraocular Movements: Extraocular movements intact.     Conjunctiva/sclera: Conjunctivae normal.     Pupils: Pupils are equal, round, and reactive to light.  Neck:     Vascular: No carotid bruit.  Cardiovascular:     Rate and Rhythm: Normal rate and regular rhythm.     Pulses: Normal pulses.     Heart sounds: No murmur heard.    No friction rub. No gallop.  Pulmonary:  Effort: Pulmonary effort is normal. No respiratory distress.     Breath sounds: Normal breath sounds. No stridor. No wheezing, rhonchi or rales.  Chest:     Chest wall: No tenderness.  Abdominal:     General: Abdomen is flat. Bowel sounds are normal. There is no distension.     Palpations: Abdomen is soft. There is no mass.     Tenderness: There is no abdominal tenderness. There is no right CVA tenderness, left CVA  tenderness, guarding or rebound.     Hernia: No hernia is present.  Genitourinary:    Comments: Breast and pelvic exams deferred with shared decision making Musculoskeletal:        General: No swelling, tenderness, deformity or signs of injury.     Cervical back: Normal range of motion and neck supple. No rigidity. No muscular tenderness.     Right lower leg: No edema.     Left lower leg: No edema.  Lymphadenopathy:     Cervical: No cervical adenopathy.  Skin:    General: Skin is warm and dry.     Capillary Refill: Capillary refill takes less than 2 seconds.     Coloration: Skin is not jaundiced or pale.     Findings: No bruising, erythema, lesion or rash.  Neurological:     General: No focal deficit present.     Mental Status: She is alert and oriented to person, place, and time. Mental status is at baseline.     Cranial Nerves: No cranial nerve deficit.     Sensory: No sensory deficit.     Motor: No weakness.     Coordination: Coordination normal.     Gait: Gait normal.     Deep Tendon Reflexes: Reflexes normal.  Psychiatric:        Mood and Affect: Mood normal.        Behavior: Behavior normal.        Thought Content: Thought content normal.        Judgment: Judgment normal.     Results for orders placed or performed in visit on 07/08/23  Microalbumin, Urine Waived   Collection Time: 07/08/23  8:39 AM  Result Value Ref Range   Microalb, Ur Waived 80 (H) 0 - 19 mg/L   Creatinine, Urine Waived 300 10 - 300 mg/dL   Microalb/Creat Ratio 30-300 (H) <30 mg/g      Assessment & Plan:   Problem List Items Addressed This Visit       Cardiovascular and Mediastinum   HTN (hypertension)   BP running high. Would like to see Dr. Servando Salina in cardiology- referral placed. Will increase her losartan to 100mg  and recheck in 6 weeks if she has not seen cardiology in that time. Call with any concerns. Labs drawn today.      Relevant Medications   losartan (COZAAR) 100 MG tablet    metoprolol succinate (TOPROL-XL) 25 MG 24 hr tablet   Other Relevant Orders   Microalbumin, Urine Waived (Completed)   Ambulatory referral to Cardiology     Other   History of morbid obesity   Doing well on her wegovy. Refill given today.      Iron deficiency anemia due to chronic blood loss   Rechecking labs today. Await results. Treat as needed.       Depression, major, single episode, moderate (HCC)   Under good control on current regimen. Continue current regimen. Continue to monitor. Call with any concerns. Refills given. Labs drawn today.  Relevant Medications   buPROPion (WELLBUTRIN XL) 300 MG 24 hr tablet   Other Visit Diagnoses       Routine general medical examination at a health care facility    -  Primary   Vaccines up to date. Screening labs checked today. Pap and mammo up to date. Colonoscopy ordered. Continue diet and exercise. Call with any concerns.   Relevant Orders   CBC with Differential/Platelet   Comprehensive metabolic panel   Lipid Panel w/o Chol/HDL Ratio   TSH     Screening for colon cancer       Relevant Orders   Ambulatory referral to Gastroenterology        Follow up plan: Return in about 6 weeks (around 08/19/2023) for follow up BP.   LABORATORY TESTING:  - Pap smear: up to date  IMMUNIZATIONS:   - Tdap: Tetanus vaccination status reviewed: last tetanus booster within 10 years. - Influenza: Up to date - COVID: Refused  SCREENING: -Mammogram: Up to date  - Colonoscopy: Ordered today   PATIENT COUNSELING:   Advised to take 1 mg of folate supplement per day if capable of pregnancy.   Sexuality: Discussed sexually transmitted diseases, partner selection, use of condoms, avoidance of unintended pregnancy  and contraceptive alternatives.   Advised to avoid cigarette smoking.  I discussed with the patient that most people either abstain from alcohol or drink within safe limits (<=14/week and <=4 drinks/occasion for males, <=7/weeks  and <= 3 drinks/occasion for females) and that the risk for alcohol disorders and other health effects rises proportionally with the number of drinks per week and how often a drinker exceeds daily limits.  Discussed cessation/primary prevention of drug use and availability of treatment for abuse.   Diet: Encouraged to adjust caloric intake to maintain  or achieve ideal body weight, to reduce intake of dietary saturated fat and total fat, to limit sodium intake by avoiding high sodium foods and not adding table salt, and to maintain adequate dietary potassium and calcium preferably from fresh fruits, vegetables, and low-fat dairy products.    stressed the importance of regular exercise  Injury prevention: Discussed safety belts, safety helmets, smoke detector, smoking near bedding or upholstery.   Dental health: Discussed importance of regular tooth brushing, flossing, and dental visits.    NEXT PREVENTATIVE PHYSICAL DUE IN 1 YEAR. Return in about 6 weeks (around 08/19/2023) for follow up BP.

## 2023-07-08 NOTE — Assessment & Plan Note (Signed)
BP running high. Would like to see Dr. Servando Salina in cardiology- referral placed. Will increase her losartan to 100mg  and recheck in 6 weeks if she has not seen cardiology in that time. Call with any concerns. Labs drawn today.

## 2023-07-08 NOTE — Assessment & Plan Note (Signed)
Rechecking labs today. Await results. Treat as needed.  °

## 2023-07-09 LAB — TSH: TSH: 2.61 u[IU]/mL (ref 0.450–4.500)

## 2023-07-09 LAB — COMPREHENSIVE METABOLIC PANEL
ALT: 8 [IU]/L (ref 0–32)
AST: 14 [IU]/L (ref 0–40)
Albumin: 3.5 g/dL — ABNORMAL LOW (ref 3.9–4.9)
Alkaline Phosphatase: 75 [IU]/L (ref 44–121)
BUN/Creatinine Ratio: 14 (ref 9–23)
BUN: 11 mg/dL (ref 6–24)
Bilirubin Total: 0.3 mg/dL (ref 0.0–1.2)
CO2: 23 mmol/L (ref 20–29)
Calcium: 8.4 mg/dL — ABNORMAL LOW (ref 8.7–10.2)
Chloride: 107 mmol/L — ABNORMAL HIGH (ref 96–106)
Creatinine, Ser: 0.81 mg/dL (ref 0.57–1.00)
Globulin, Total: 3 g/dL (ref 1.5–4.5)
Glucose: 81 mg/dL (ref 70–99)
Potassium: 3.3 mmol/L — ABNORMAL LOW (ref 3.5–5.2)
Sodium: 141 mmol/L (ref 134–144)
Total Protein: 6.5 g/dL (ref 6.0–8.5)
eGFR: 91 mL/min/{1.73_m2} (ref >59)

## 2023-07-09 LAB — CBC WITH DIFFERENTIAL/PLATELET
Basophils Absolute: 0 10*3/uL (ref 0.0–0.2)
Basos: 1 %
EOS (ABSOLUTE): 0 10*3/uL (ref 0.0–0.4)
Eos: 1 %
Hematocrit: 31.9 % — ABNORMAL LOW (ref 34.0–46.6)
Hemoglobin: 10.2 g/dL — ABNORMAL LOW (ref 11.1–15.9)
Immature Grans (Abs): 0 10*3/uL (ref 0.0–0.1)
Immature Granulocytes: 0 %
Lymphocytes Absolute: 1.3 10*3/uL (ref 0.7–3.1)
Lymphs: 33 %
MCH: 26.7 pg (ref 26.6–33.0)
MCHC: 32 g/dL (ref 31.5–35.7)
MCV: 84 fL (ref 79–97)
Monocytes Absolute: 0.2 10*3/uL (ref 0.1–0.9)
Monocytes: 6 %
Neutrophils Absolute: 2.3 10*3/uL (ref 1.4–7.0)
Neutrophils: 59 %
Platelets: 177 10*3/uL (ref 150–450)
RBC: 3.82 x10E6/uL (ref 3.77–5.28)
RDW: 13.9 % (ref 11.7–15.4)
WBC: 3.8 10*3/uL (ref 3.4–10.8)

## 2023-07-09 LAB — LIPID PANEL W/O CHOL/HDL RATIO
Cholesterol, Total: 131 mg/dL (ref 100–199)
HDL: 53 mg/dL (ref >39)
LDL Chol Calc (NIH): 66 mg/dL (ref 0–99)
Triglycerides: 55 mg/dL (ref 0–149)
VLDL Cholesterol Cal: 12 mg/dL (ref 5–40)

## 2023-07-11 ENCOUNTER — Other Ambulatory Visit (HOSPITAL_COMMUNITY): Payer: Self-pay

## 2023-07-13 ENCOUNTER — Encounter: Payer: Self-pay | Admitting: Family Medicine

## 2023-07-18 ENCOUNTER — Other Ambulatory Visit (HOSPITAL_COMMUNITY): Payer: Self-pay

## 2023-07-21 ENCOUNTER — Encounter: Payer: Self-pay | Admitting: *Deleted

## 2023-07-22 ENCOUNTER — Encounter: Payer: Self-pay | Admitting: Internal Medicine

## 2023-07-22 ENCOUNTER — Telehealth: Payer: Self-pay | Admitting: Physician Assistant

## 2023-07-22 DIAGNOSIS — T3695XA Adverse effect of unspecified systemic antibiotic, initial encounter: Secondary | ICD-10-CM

## 2023-07-22 DIAGNOSIS — B379 Candidiasis, unspecified: Secondary | ICD-10-CM

## 2023-07-22 DIAGNOSIS — R3989 Other symptoms and signs involving the genitourinary system: Secondary | ICD-10-CM

## 2023-07-22 MED ORDER — FLUCONAZOLE 150 MG PO TABS
150.0000 mg | ORAL_TABLET | ORAL | 0 refills | Status: DC | PRN
  Filled 2023-07-22: qty 2, 6d supply, fill #0

## 2023-07-22 MED ORDER — NITROFURANTOIN MONOHYD MACRO 100 MG PO CAPS
100.0000 mg | ORAL_CAPSULE | Freq: Two times a day (BID) | ORAL | 0 refills | Status: DC
  Filled 2023-07-22: qty 10, 5d supply, fill #0

## 2023-07-22 NOTE — Progress Notes (Signed)
 E-Visit for Urinary Problems  We are sorry that you are not feeling well.  Here is how we plan to help!  Based on what you shared with me it looks like you most likely have a simple urinary tract infection.  A UTI (Urinary Tract Infection) is a bacterial infection of the bladder.  Most cases of urinary tract infections are simple to treat but a key part of your care is to encourage you to drink plenty of fluids and watch your symptoms carefully.  I have prescribed MacroBid 100 mg twice a day for 5 days.  Your symptoms should gradually improve. Call us if the burning in your urine worsens, you develop worsening fever, back pain or pelvic pain or if your symptoms do not resolve after completing the antibiotic.  Diflucan given as prophylaxis as patient tends to get vaginal yeast infections with antibiotic use.  Urinary tract infections can be prevented by drinking plenty of water to keep your body hydrated.  Also be sure when you wipe, wipe from front to back and don't hold it in!  If possible, empty your bladder every 4 hours.  HOME CARE Drink plenty of fluids Compete the full course of the antibiotics even if the symptoms resolve Remember, when you need to go.go. Holding in your urine can increase the likelihood of getting a UTI! GET HELP RIGHT AWAY IF: You cannot urinate You get a high fever Worsening back pain occurs You see blood in your urine You feel sick to your stomach or throw up You feel like you are going to pass out  MAKE SURE YOU  Understand these instructions. Will watch your condition. Will get help right away if you are not doing well or get worse.   Thank you for choosing an e-visit.  Your e-visit answers were reviewed by a board certified advanced clinical practitioner to complete your personal care plan. Depending upon the condition, your plan could have included both over the counter or prescription medications.  Please review your pharmacy choice. Make sure  the pharmacy is open so you can pick up prescription now. If there is a problem, you may contact your provider through [employer] and have the prescription routed to another pharmacy.  Your safety is important to [place]. If you have drug allergies check your prescription carefully.   For the next 24 hours you can use MyChart to ask questions about today's visit, request a non-urgent call back, or ask for a work or school excuse. You will get an email in the next two days asking about your experience. I hope that your e-visit has been valuable and will speed your recovery.   I have spent 5 minutes in review of e-visit questionnaire, review and updating patient chart, medical decision making and response to patient.   Margaretann Loveless, PA-C

## 2023-07-23 ENCOUNTER — Encounter: Payer: Self-pay | Admitting: Internal Medicine

## 2023-07-23 ENCOUNTER — Other Ambulatory Visit (HOSPITAL_COMMUNITY): Payer: Self-pay

## 2023-07-24 ENCOUNTER — Other Ambulatory Visit (HOSPITAL_COMMUNITY): Payer: Self-pay

## 2023-07-24 ENCOUNTER — Encounter: Payer: Self-pay | Admitting: Internal Medicine

## 2023-07-29 ENCOUNTER — Encounter: Payer: Self-pay | Admitting: Internal Medicine

## 2023-07-29 ENCOUNTER — Other Ambulatory Visit (HOSPITAL_COMMUNITY): Payer: Self-pay

## 2023-07-29 ENCOUNTER — Other Ambulatory Visit (HOSPITAL_BASED_OUTPATIENT_CLINIC_OR_DEPARTMENT_OTHER): Payer: Self-pay

## 2023-08-13 ENCOUNTER — Other Ambulatory Visit (HOSPITAL_COMMUNITY): Payer: Self-pay

## 2023-08-13 ENCOUNTER — Encounter: Payer: Self-pay | Admitting: Internal Medicine

## 2023-08-19 ENCOUNTER — Ambulatory Visit: Payer: Managed Care, Other (non HMO) | Admitting: Family Medicine

## 2023-08-26 ENCOUNTER — Encounter: Payer: Self-pay | Admitting: Family Medicine

## 2023-08-28 ENCOUNTER — Encounter: Payer: Self-pay | Admitting: Internal Medicine

## 2023-08-28 ENCOUNTER — Telehealth: Payer: Self-pay | Admitting: Physician Assistant

## 2023-08-28 ENCOUNTER — Other Ambulatory Visit (HOSPITAL_COMMUNITY): Payer: Self-pay

## 2023-08-28 DIAGNOSIS — N760 Acute vaginitis: Secondary | ICD-10-CM

## 2023-08-28 DIAGNOSIS — B9689 Other specified bacterial agents as the cause of diseases classified elsewhere: Secondary | ICD-10-CM

## 2023-08-28 MED ORDER — METRONIDAZOLE 500 MG PO TABS
500.0000 mg | ORAL_TABLET | Freq: Two times a day (BID) | ORAL | 0 refills | Status: AC
  Filled 2023-08-28: qty 14, 7d supply, fill #0

## 2023-08-28 NOTE — Progress Notes (Signed)
 E-Visit for Vaginal Symptoms  We are sorry that you are not feeling well. Here is how we plan to help! Based on what you shared with me it looks like you: May have a vaginosis due to bacteria  Vaginosis is an inflammation of the vagina that can result in discharge, itching and pain. The cause is usually a change in the normal balance of vaginal bacteria or an infection. Vaginosis can also result from reduced estrogen levels after menopause.  The most common causes of vaginosis are:   Bacterial vaginosis which results from an overgrowth of one on several organisms that are normally present in your vagina.   Yeast infections which are caused by a naturally occurring fungus called candida.   Vaginal atrophy (atrophic vaginosis) which results from the thinning of the vagina from reduced estrogen levels after menopause.   Trichomoniasis which is caused by a parasite and is commonly transmitted by sexual intercourse.  Factors that increase your risk of developing vaginosis include: Medications, such as antibiotics and steroids Uncontrolled diabetes Use of hygiene products such as bubble bath, vaginal spray or vaginal deodorant Douching Wearing damp or tight-fitting clothing Using an intrauterine device (IUD) for birth control Hormonal changes, such as those associated with pregnancy, birth control pills or menopause Sexual activity Having a sexually transmitted infection  Your treatment plan is Metronidazole or Flagyl 500mg  twice a day for 7 days.  I have electronically sent this prescription into the pharmacy that you have chosen.  Be sure to take all of the medication as directed. Stop taking any medication if you develop a rash, tongue swelling or shortness of breath. Mothers who are breast feeding should consider pumping and discarding their breast milk while on these antibiotics. However, there is no consensus that infant exposure at these doses would be harmful.  Remember that  medication creams can weaken latex condoms. SABRA   HOME CARE:  Good hygiene may prevent some types of vaginosis from recurring and may relieve some symptoms:  Avoid baths, hot tubs and whirlpool spas. Rinse soap from your outer genital area after a shower, and dry the area well to prevent irritation. Don't use scented or harsh soaps, such as those with deodorant or antibacterial action. Avoid irritants. These include scented tampons and pads. Wipe from front to back after using the toilet. Doing so avoids spreading fecal bacteria to your vagina.  Other things that may help prevent vaginosis include:  Don't douche. Your vagina doesn't require cleansing other than normal bathing. Repetitive douching disrupts the normal organisms that reside in the vagina and can actually increase your risk of vaginal infection. Douching won't clear up a vaginal infection. Use a latex condom. Both female and female latex condoms may help you avoid infections spread by sexual contact. Wear cotton underwear. Also wear pantyhose with a cotton crotch. If you feel comfortable without it, skip wearing underwear to bed. Yeast thrives in [employer] Your symptoms should improve in the next day or two.  GET HELP RIGHT AWAY IF:  You have pain in your lower abdomen ( pelvic area or over your ovaries) You develop nausea or vomiting You develop a fever Your discharge changes or worsens You have persistent pain with intercourse You develop shortness of breath, a rapid pulse, or you faint.  These symptoms could be signs of problems or infections that need to be evaluated by a medical provider now.  MAKE SURE YOU   Understand these instructions. Will watch your condition. Will get help right  away if you are not doing well or get worse.  Thank you for choosing an e-visit.  Your e-visit answers were reviewed by a board certified advanced clinical practitioner to complete your personal care plan. Depending upon the  condition, your plan could have included both over the counter or prescription medications.  Please review your pharmacy choice. Make sure the pharmacy is open so you can pick up prescription now. If there is a problem, you may contact your provider through [employer] and have the prescription routed to another pharmacy.  Your safety is important to us . If you have drug allergies check your prescription carefully.   For the next 24 hours you can use MyChart to ask questions about today's visit, request a non-urgent call back, or ask for a work or school excuse. You will get an email in the next two days asking about your experience. I hope that your e-visit has been valuable and will speed your recovery.  I have spent 5 minutes in review of e-visit questionnaire, review and updating patient chart, medical decision making and response to patient.   Rachel Salas Dickinson, PA-C

## 2023-09-01 ENCOUNTER — Other Ambulatory Visit: Payer: Self-pay | Admitting: Family Medicine

## 2023-09-01 MED ORDER — WEGOVY 2.4 MG/0.75ML ~~LOC~~ SOAJ: 2.4000 mg | SUBCUTANEOUS | 1 refills | Status: AC

## 2023-09-01 NOTE — Telephone Encounter (Signed)
This pharmacy doesn't seem to be in [facility]- can we call them and see if we can fax or find out where we need to send her Rx to?

## 2023-10-01 NOTE — Progress Notes (Signed)
 Dr. Servando Salina and Leavy Cella,  Rachel Salas has been identified as a patient who may benefit from health coaching to develop healthy eating habits and increasing physical activity per [facility] visit on 09/21/23.  Please refer to [id number] or search for Care Navigation.  Renaee Munda, MS, ERHD, [facility]  Care Guide, Health & Wellness Coach [address], [address] [place] [address] Telephone: [phone] Email: [email or web address]

## 2023-10-05 ENCOUNTER — Encounter: Payer: Self-pay | Admitting: Internal Medicine

## 2023-10-07 ENCOUNTER — Ambulatory Visit: Payer: Self-pay | Admitting: Family Medicine

## 2023-10-08 ENCOUNTER — Encounter: Payer: Self-pay | Admitting: Cardiology

## 2023-10-08 ENCOUNTER — Ambulatory Visit: Payer: Self-pay | Attending: Cardiology | Admitting: Cardiology

## 2023-10-08 VITALS — BP 152/100 | HR 81 | Ht 68.0 in | Wt 216.2 lb

## 2023-10-08 DIAGNOSIS — Z131 Encounter for screening for diabetes mellitus: Secondary | ICD-10-CM | POA: Diagnosis not present

## 2023-10-08 DIAGNOSIS — I10 Essential (primary) hypertension: Secondary | ICD-10-CM | POA: Diagnosis not present

## 2023-10-08 DIAGNOSIS — I2510 Atherosclerotic heart disease of native coronary artery without angina pectoris: Secondary | ICD-10-CM

## 2023-10-08 DIAGNOSIS — E559 Vitamin D deficiency, unspecified: Secondary | ICD-10-CM | POA: Diagnosis not present

## 2023-10-08 DIAGNOSIS — Z7689 Persons encountering health services in other specified circumstances: Secondary | ICD-10-CM

## 2023-10-08 DIAGNOSIS — Z01812 Encounter for preprocedural laboratory examination: Secondary | ICD-10-CM

## 2023-10-08 MED ORDER — AMLODIPINE BESYLATE 5 MG PO TABS: 5.0000 mg | ORAL_TABLET | Freq: Every day | ORAL | 3 refills | Status: DC

## 2023-10-08 MED ORDER — METOPROLOL TARTRATE 100 MG PO TABS: ORAL_TABLET | ORAL | 0 refills | Status: DC

## 2023-10-08 MED ORDER — CARVEDILOL 6.25 MG PO TABS: 6.2500 mg | ORAL_TABLET | Freq: Two times a day (BID) | ORAL | 3 refills | Status: AC

## 2023-10-08 NOTE — Patient Instructions (Addendum)
 Medication Instructions:  Your physician has recommended you make the following change in your medication:  STOP: Toprol-XL START: Coreg 6.25 mg twice daily INCREASE: Amlodipine 5 mg once daily *If you need a refill on your cardiac medications before your next appointment, please call your pharmacy*   Lab Work:  Lp(a), Vitamin D, HgbA1c, CMET, Mag If you have labs (blood work) drawn today and your tests are completely normal, you will receive your results only by: MyChart Message (if you have MyChart) OR A paper copy in the mail If you have any lab test that is abnormal or we need to change your treatment, we will call you to review the results.   Testing/Procedures: Your physician has requested that you have a renal artery duplex. During this test, an ultrasound is used to evaluate blood flow to the kidneys. Allow one hour for this exam. Do not eat after midnight the day before and avoid carbonated beverages. Take your medications as you usually do.  Your cardiac CT will be scheduled at one of the below locations:   [facility] [address] [place], [place] [address] [phone]   If scheduled at [facility], please arrive at the [facility] and Children's Entrance (Entrance C2) of [facility] 30 minutes prior to test start time. You can use the FREE valet parking offered at entrance C (encouraged to control the heart rate for the test)  Proceed to the [facility] Radiology Department (first floor) to check-in and test prep.  All radiology patients and guests should use entrance C2 at [facility], accessed from [facility], even though the hospital's physical address listed is [address]    Please follow these instructions carefully (unless otherwise directed):  An IV will be required for this test and Nitroglycerin will be given.   On the Night Before the Test: Be sure to Drink plenty of water. Do not consume any  caffeinated/decaffeinated beverages or chocolate 12 hours prior to your test. Do not take any antihistamines 12 hours prior to your test.   On the Day of the Test: Drink plenty of water until 1 hour prior to the test. Do not eat any food 1 hour prior to test. You may take your regular medications prior to the test.  Take metoprolol (Lopressor) two hours prior to test. If you take Furosemide/Hydrochlorothiazide/Spironolactone/Chlorthalidone, please HOLD on the morning of the test. Patients who wear a continuous glucose monitor MUST remove the device prior to scanning. FEMALES- please wear underwire-free bra if available, avoid dresses & tight clothing  After the Test: Drink plenty of water. After receiving IV contrast, you may experience a mild flushed feeling. This is normal. On occasion, you may experience a mild rash up to 24 hours after the test. This is not dangerous. If this occurs, you can take Benadryl 25 mg, Zyrtec, Claritin, or Allegra and increase your fluid intake. (Patients taking Tikosyn should avoid Benadryl, and may take Zyrtec, Claritin, or Allegra) If you experience trouble breathing, this can be serious. If it is severe call 911 IMMEDIATELY. If it is mild, please call our office.  We will call to schedule your test 2-4 weeks out understanding that some insurance companies will need an authorization prior to the service being performed.   For more information and frequently asked questions, please visit our website : [email or web address]  For non-scheduling related questions, please contact the cardiac imaging nurse navigator should you have any questions/concerns: Cardiac Imaging Nurse  Comptroller Dial: [phone]   For scheduling needs, including cancellations and rescheduling, please call [place], [phone].   Follow-Up: At [facility], you and your health needs are our priority.  As part of our continuing mission to provide  you with exceptional heart care, we have created designated Provider Care Teams.  These Care Teams include your primary Cardiologist (physician) and Advanced Practice Providers (APPs -  Physician Assistants and Nurse Practitioners) who all work together to provide you with the care you need, when you need it.   Your next appointment:   16 week(s)  Provider:   Thomasene Ripple, DO     Other Instructions A referral for Pharm-D made please see them in 8 weeks.

## 2023-10-09 NOTE — Progress Notes (Signed)
 Cardiology Office Note:    Date:  10/09/2023   ID:  Rachel Salas, DOB [birth year], MRN [id number]  PCP:  Rachel Carrow, DO  Cardiologist:  Thomasene Ripple, DO  Electrophysiologist:  None   Referring MD: Rachel Carrow, DO   " I am experiencing high blood pressure"   History of Present Illness:    Rachel Salas is a 48 y.o. female with a hx of Pseudotumor cerebri, Essential hypertension, GERD and Iron deficiency anemia.   She presents with a long-standing history of hypertension that remains uncontrolled despite being on multiple medications. She reports having been on blood pressure medications for a long time and having lost significant weight, yet her blood pressure remains high. Recently, her primary care provider increased the dose of Losartan and added Norvasc to her regimen. Despite these changes, the patient's blood pressure remains elevated. The patient also reports a history of occasional palpitations and headaches, which occur more frequently before her menstrual cycle. She has no known thyroid disease or diabetes. The patient has a family history of strokes and high blood pressure   Past Medical History:  Diagnosis Date   Adenomyosis 11/15/2014   u/s suspicious for adenomyosis   Essential hypertension, benign 10/14/2012   GERD (gastroesophageal reflux disease)    Hypertension    Hypokalemia    IDA (iron deficiency anemia)    Menorrhagia with irregular cycle    Pseudotumor cerebri dx 2007   Severe obesity (HCC)     Past Surgical History:  Procedure Laterality Date   TONSILLECTOMY  2007   TUBAL LIGATION  2002    Current Medications: Current Meds  Medication Sig   amLODipine (NORVASC) 5 MG tablet Take 1 tablet (5 mg total) by mouth daily.   buPROPion (WELLBUTRIN XL) 300 MG 24 hr tablet Take 1 tablet (300 mg) by mouth daily.   carvedilol (COREG) 6.25 MG tablet Take 1 tablet (6.25 mg total) by mouth 2 (two) times daily.   cyclobenzaprine (FLEXERIL) 10 MG tablet  Take 1 tablet (10 mg total) by mouth at bedtime if needed.   EPINEPHrine 0.3 mg/0.3 mL IJ SOAJ injection Inject 0.82mL into the muscle one time as needed for anaphylaxis   losartan (COZAAR) 100 MG tablet Take 1 tablet (100 mg total) by mouth daily.   metoprolol tartrate (LOPRESSOR) 100 MG tablet Take 2 hours prior to CT   ondansetron (ZOFRAN) 4 MG tablet Take 1 tablet (4 mg total) by mouth every 8 (eight) hours as needed for nausea or vomiting.   Semaglutide-Weight Management (WEGOVY) 2.4 MG/0.75ML SOAJ Inject 2.4 mg into the skin once a week.   valACYclovir (VALTREX) 1000 MG tablet Take 2 tablets (2,000 mg total) by mouth 2 (two) times daily for 1 day. Start as soon as you feel the symptoms.   [DISCONTINUED] amLODipine (NORVASC) 2.5 MG tablet Take 2.5 mg by mouth daily.   [DISCONTINUED] metoprolol succinate (TOPROL-XL) 25 MG 24 hr tablet Take 1 tablet (25 mg total) by mouth daily.     Allergies:   Naproxen sodium, Tylenol [acetaminophen], and Other   Social History   Socioeconomic History   Marital status: Single    Spouse name: Not on file   Number of children: 3   Years of education: BS   Highest education level: Master's degree (e.g., MA, MS, MEng, MEd, MSW, MBA)  Occupational History    Employer: [employer]  Tobacco Use   Smoking status: Never    Passive exposure: Past   Smokeless  tobacco: Never  Vaping Use   Vaping status: Never Used  Substance and Sexual Activity   Alcohol use: Yes    Alcohol/week: 0.0 standard drinks of alcohol    Comment: Rare/Occasional   Drug use: No   Sexual activity: Yes    Partners: Male    Birth control/protection: Surgical, None    Comment: BTL  Other Topics Concern   Not on file  Social History Narrative   2-3 caffeine drinks a week       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 No smoking; occasional alcohol.    Social Drivers of Corporate investment banker Strain: Low Risk  (09/21/2023)   Received from [facility]    Overall Financial Resource Strain (CARDIA)    Difficulty of Paying Living Expenses: Not hard at all  Food Insecurity: No Food Insecurity (09/21/2023)   Received from [facility]   Hunger Vital Sign    Worried About Running Out of Food in the Last Year: Never true    Ran Out of Food in the Last Year: Never true  Transportation Needs: No Transportation Needs (09/21/2023)   Received from [facility] - Transportation    Lack of Transportation (Medical): No    Lack of Transportation (Non-Medical): No  Physical Activity: Unknown (07/07/2023)   Exercise Vital Sign    Days of Exercise per Week: 0 days    Minutes of Exercise per Session: Not on file  Stress: No Stress Concern Present (07/07/2023)   [employer] of Occupational Health - Occupational Stress Questionnaire    Feeling of Stress : Not at all  Social Connections: Socially Isolated (07/07/2023)   Social Connection and Isolation Panel [NHANES]    Frequency of Communication with Friends and Family: More than three times a week    Frequency of Social Gatherings with Friends and Family: Once a week    Attends Religious Services: Never    Database administrator or Organizations: No    Attends Engineer, structural: Not on file    Marital Status: Separated     Family History: The patient's family history includes Healthy in her brother, daughter, son, and son; Hypercholesterolemia in her mother; Hypertension in her mother; Stroke in her maternal grandmother; Thyroid disease in her mother. There is no history of Colon cancer, Breast cancer, Ovarian cancer, or Heart disease.  ROS:   Review of Systems  Constitution: Negative for decreased appetite, fever and weight gain.  HENT: Negative for congestion, ear discharge, hoarse voice and sore throat.   Eyes: Negative for discharge, redness, vision loss in right eye and visual halos.  Cardiovascular: Reports palpitations. Negative for chest pain, dyspnea on exertion, leg  swelling, orthopnea and palpitations.  Respiratory: Negative for cough, hemoptysis, shortness of breath and snoring.   Endocrine: Negative for heat intolerance and polyphagia.  Hematologic/Lymphatic: Negative for bleeding problem. Does not bruise/bleed easily.  Skin: Negative for flushing, nail changes, rash and suspicious lesions.  Musculoskeletal: Negative for arthritis, joint pain, muscle cramps, myalgias, neck pain and stiffness.  Gastrointestinal: Negative for abdominal pain, bowel incontinence, diarrhea and excessive appetite.  Genitourinary: Negative for decreased libido, genital sores and incomplete emptying.  Neurological: Negative for brief paralysis, focal weakness, headaches and loss of balance.  Psychiatric/Behavioral: Negative for altered mental status, depression and suicidal ideas.  Allergic/Immunologic: Negative for HIV exposure and persistent infections.    EKGs/Labs/Other Studies Reviewed:    The following studies were reviewed today:  EKG:  The ekg ordered today demonstrates sinus rhythm  Recent Labs: 07/08/2023: ALT 8; BUN 11; Creatinine, Ser 0.81; Hemoglobin 10.2; Platelets 177; Potassium 3.3; Sodium 141; TSH 2.610  Recent Lipid Panel    Component Value Date/Time   CHOL 131 07/08/2023 0840   TRIG 55 07/08/2023 0840   HDL 53 07/08/2023 0840   CHOLHDL 3 03/13/2015 0954   VLDL 7.8 03/13/2015 0954   LDLCALC 66 07/08/2023 0840    Physical Exam:    VS:  BP (!) 152/100   Pulse 81   Ht 5\' 8"  (1.727 m)   Wt 216 lb 3.2 oz (98.1 kg)   SpO2 97%   BMI 32.87 kg/m     Wt Readings from Last 3 Encounters:  10/08/23 216 lb 3.2 oz (98.1 kg)  07/08/23 195 lb (88.5 kg)  05/22/23 189 lb (85.7 kg)     GEN: Well nourished, well developed in no acute distress HEENT: Normal NECK: No JVD; No carotid bruits LYMPHATICS: No lymphadenopathy CARDIAC: S1S2 noted,RRR, no murmurs, rubs, gallops RESPIRATORY:  Clear to auscultation without rales, wheezing or rhonchi  ABDOMEN:  Soft, non-tender, non-distended, +bowel sounds, no guarding. EXTREMITIES: No edema, No cyanosis, no clubbing MUSCULOSKELETAL:  No deformity  SKIN: Warm and dry NEUROLOGIC:  Alert and oriented x 3, non-focal PSYCHIATRIC:  Normal affect, good insight  ASSESSMENT:    1. Encounter to establish care   2. Hypertension, unspecified type   3. ASCVD (arteriosclerotic cardiovascular disease)   4. Vitamin D deficiency   5. Screening for diabetes mellitus (DM)   6. Pre-procedure lab exam    PLAN:    Essential Hypertension Chronic hypertension with suboptimal control. Current regimen includes losartan, metoprolol, and low-dose amlodipine. Blood pressure remains elevated. Discussed switching metoprolol to carvedilol and increasing amlodipine. Informed about potential side effects and combination pill options. - Increase amlodipine to 5 mg daily. - Discontinue metoprolol. - Initiate carvedilol 6.25 mg twice daily. - Order renal ultrasound to rule out secondary causes of hypertension. - Order coronary calcium scoring. - Order lipoprotein A test. - Order hemoglobin A1c test. - Order vitamin D level test. - Schedule follow-up with pharmacist in 8 weeks. - Schedule follow-up with cardiologist in 16 weeks.  Family history of cardiovascular disease Maternal history of thyroid disease, hypertension, hyperlipidemia, and stroke. Discussed cardiovascular risk assessment and benefits of coronary calcium scoring and lipoprotein A testing. - Order lipoprotein A test. - Order coronary calcium scoring.  Headaches Headaches occur premenstrually. No direct link to hypertension. Monitoring advised. - Monitor headache patterns and consider alternative pain management options if needed.  The patient is in agreement with the above plan. The patient left the office in stable condition.  The patient will follow up in   Medication Adjustments/Labs and Tests Ordered: Current medicines are reviewed at length with  the patient today.  Concerns regarding medicines are outlined above.  Orders Placed This Encounter  Procedures   CT CORONARY MORPH W/CTA COR W/SCORE W/CA W/CM &/OR WO/CM   Lipoprotein A (LPA)   VITAMIN D 25 Hydroxy (Vit-D Deficiency, Fractures)   Hemoglobin A1c   Comprehensive Metabolic Panel (CMET)   Magnesium   EKG 12-Lead   VAS US RENAL ARTERY DUPLEX   Meds ordered this encounter  Medications   metoprolol tartrate (LOPRESSOR) 100 MG tablet    Sig: Take 2 hours prior to CT    Dispense:  1 tablet    Refill:  0   carvedilol (COREG) 6.25 MG tablet    Sig:  Take 1 tablet (6.25 mg total) by mouth 2 (two) times daily.    Dispense:  180 tablet    Refill:  3   amLODipine (NORVASC) 5 MG tablet    Sig: Take 1 tablet (5 mg total) by mouth daily.    Dispense:  180 tablet    Refill:  3    Patient Instructions  Medication Instructions:  Your physician has recommended you make the following change in your medication:  STOP: Toprol-XL START: Coreg 6.25 mg twice daily INCREASE: Amlodipine 5 mg once daily *If you need a refill on your cardiac medications before your next appointment, please call your pharmacy*   Lab Work:  Lp(a), Vitamin D, HgbA1c, CMET, Mag If you have labs (blood work) drawn today and your tests are completely normal, you will receive your results only by: MyChart Message (if you have MyChart) OR A paper copy in the mail If you have any lab test that is abnormal or we need to change your treatment, we will call you to review the results.   Testing/Procedures: Your physician has requested that you have a renal artery duplex. During this test, an ultrasound is used to evaluate blood flow to the kidneys. Allow one hour for this exam. Do not eat after midnight the day before and avoid carbonated beverages. Take your medications as you usually do.  Your cardiac CT will be scheduled at one of the below locations:   [facility] [address] [place], [place] [address] [phone]   If scheduled at [facility], please arrive at the [facility] and Children's Entrance (Entrance C2) of [facility] 30 minutes prior to test start time. You can use the FREE valet parking offered at entrance C (encouraged to control the heart rate for the test)  Proceed to the [facility] Radiology Department (first floor) to check-in and test prep.  All radiology patients and guests should use entrance C2 at [facility], accessed from [facility], even though the hospital's physical address listed is [address]    Please follow these instructions carefully (unless otherwise directed):  An IV will be required for this test and Nitroglycerin will be given.   On the Night Before the Test: Be sure to Drink plenty of water. Do not consume any caffeinated/decaffeinated beverages or chocolate 12 hours prior to your test. Do not take any antihistamines 12 hours prior to your test.   On the Day of the Test: Drink plenty of water until 1 hour prior to the test. Do not eat any food 1 hour prior to test. You may take your regular medications prior to the test.  Take metoprolol (Lopressor) two hours prior to test. If you take Furosemide/Hydrochlorothiazide/Spironolactone/Chlorthalidone, please HOLD on the morning of the test. Patients who wear a continuous glucose monitor MUST remove the device prior to scanning. FEMALES- please wear underwire-free bra if available, avoid dresses & tight clothing  After the Test: Drink plenty of water. After receiving IV contrast, you may experience a mild flushed feeling. This is normal. On occasion, you may experience a mild rash up to 24 hours after the test. This is not dangerous. If this occurs, you can take Benadryl 25 mg, Zyrtec, Claritin, or Allegra and increase your fluid intake. (Patients taking Tikosyn should avoid Benadryl, and may take Zyrtec, Claritin, or  Allegra) If you experience trouble breathing, this can be serious. If it is severe call 911 IMMEDIATELY. If it is mild,  please call our office.  We will call to schedule your test 2-4 weeks out understanding that some insurance companies will need an authorization prior to the service being performed.   For more information and frequently asked questions, please visit our website : [email or web address]  For non-scheduling related questions, please contact the cardiac imaging nurse navigator should you have any questions/concerns: Cardiac Imaging Nurse Navigators Direct Office Dial: [phone]   For scheduling needs, including cancellations and rescheduling, please call [place], [phone].   Follow-Up: At [facility], you and your health needs are our priority.  As part of our continuing mission to provide you with exceptional heart care, we have created designated Provider Care Teams.  These Care Teams include your primary Cardiologist (physician) and Advanced Practice Providers (APPs -  Physician Assistants and Nurse Practitioners) who all work together to provide you with the care you need, when you need it.   Your next appointment:   16 week(s)  Provider:   Thomasene Ripple, DO     Other Instructions A referral for Pharm-D made please see them in 8 weeks.    Adopting a Healthy Lifestyle.  Know what a healthy weight is for you (roughly BMI <25) and aim to maintain this   Aim for 7+ servings of fruits and vegetables daily   65-80+ fluid ounces of water or unsweet tea for healthy kidneys   Limit to max 1 drink of alcohol per day; avoid smoking/tobacco   Limit animal fats in diet for cholesterol and heart health - choose grass fed whenever available   Avoid highly processed foods, and foods high in saturated/trans fats   Aim for low stress - take time to unwind and care for your mental health   Aim for 150 min of moderate intensity exercise weekly for  heart health, and weights twice weekly for bone health   Aim for 7-9 hours of sleep daily   When it comes to diets, agreement about the perfect plan isnt easy to find, even among the experts. Experts at the [facility] [employer] [employer] developed an idea known as the Healthy Eating Plate. Just imagine a plate divided into logical, healthy portions.   The emphasis is on diet quality:   Load up on vegetables and fruits - one-half of your plate: Aim for color and variety, and remember that potatoes dont count.   Go for whole grains - one-quarter of your plate: Whole wheat, barley, wheat berries, quinoa, oats, brown rice, and foods made with them. If you want pasta, go with whole wheat pasta.   Protein power - one-quarter of your plate: Fish, chicken, beans, and nuts are all healthy, versatile protein sources. Limit red meat.   The diet, however, does go beyond the plate, offering a few other suggestions.   Use healthy plant oils, such as olive, canola, soy, corn, sunflower and peanut. Check the labels, and avoid partially hydrogenated oil, which have unhealthy trans fats.   If youre thirsty, drink water. Coffee and tea are good in moderation, but skip sugary drinks and limit milk and dairy products to one or two daily servings.   The type of carbohydrate in the diet is more important than the amount. Some sources of carbohydrates, such as vegetables, fruits, whole grains, and beans-are healthier than others.   Finally, stay active  Signed, Thomasene Ripple, DO  10/09/2023 11:00 PM    [facility]

## 2023-10-28 ENCOUNTER — Ambulatory Visit (HOSPITAL_COMMUNITY)
Admission: RE | Admit: 2023-10-28 | Discharge: 2023-10-28 | Disposition: A | Discharge status: Home / Self Care | Source: Ambulatory Visit | Attending: Cardiology | Admitting: Cardiology

## 2023-10-28 DIAGNOSIS — I10 Essential (primary) hypertension: Secondary | ICD-10-CM | POA: Insufficient documentation

## 2023-10-28 LAB — COMPREHENSIVE METABOLIC PANEL WITH GFR
ALT: 6 IU/L (ref 0–32)
AST: 12 IU/L (ref 0–40)
Albumin: 3.9 g/dL (ref 3.9–4.9)
Alkaline Phosphatase: 83 IU/L (ref 44–121)
BUN/Creatinine Ratio: 16 (ref 9–23)
BUN: 15 mg/dL (ref 6–24)
Bilirubin Total: <0.2 mg/dL (ref 0.0–1.2)
CO2: 21 mmol/L (ref 20–29)
Calcium: 8.7 mg/dL (ref 8.7–10.2)
Chloride: 106 mmol/L (ref 96–106)
Creatinine, Ser: 0.92 mg/dL (ref 0.57–1.00)
Globulin, Total: 2.8 g/dL (ref 1.5–4.5)
Glucose: 96 mg/dL (ref 70–99)
Potassium: 3.4 mmol/L — ABNORMAL LOW (ref 3.5–5.2)
Sodium: 141 mmol/L (ref 134–144)
Total Protein: 6.7 g/dL (ref 6.0–8.5)
eGFR: 78 mL/min/{1.73_m2} (ref >59)

## 2023-10-28 LAB — VITAMIN D 25 HYDROXY (VIT D DEFICIENCY, FRACTURES): Vit D, 25-Hydroxy: 10.1 ng/mL — ABNORMAL LOW (ref 30.0–100.0)

## 2023-10-28 LAB — HEMOGLOBIN A1C
Est. average glucose Bld gHb Est-mCnc: 114 mg/dL
Hgb A1c MFr Bld: 5.6 % (ref 4.8–5.6)

## 2023-10-28 LAB — MAGNESIUM: Magnesium: 1.6 mg/dL (ref 1.6–2.3)

## 2023-10-28 LAB — LIPOPROTEIN A (LPA): Lipoprotein (a): 133 nmol/L — ABNORMAL HIGH (ref <75.0)

## 2023-11-06 ENCOUNTER — Encounter: Payer: Self-pay | Admitting: Cardiology

## 2023-11-06 ENCOUNTER — Ambulatory Visit: Admitting: Pharmacist

## 2023-11-06 VITALS — BP 151/110 | HR 86 | Ht 68.0 in | Wt 216.5 lb

## 2023-11-06 DIAGNOSIS — I10 Essential (primary) hypertension: Secondary | ICD-10-CM

## 2023-11-06 MED ORDER — AMLODIPINE BESYLATE 10 MG PO TABS: 10.0000 mg | ORAL_TABLET | Freq: Every day | ORAL | 3 refills | Status: DC

## 2023-11-06 MED ORDER — OMRON 3 SERIES BP MONITOR DEVI: 1.0000 | Freq: Every day | 0 refills | Status: AC

## 2023-11-06 NOTE — Patient Instructions (Addendum)
 It was nice meeting you today  We would like your blood pressure to be less than 130/80  Please continue your carvedilol  6.25mg  twice a day and losartan  100 mg once a day  I would like to increase your amlodipine  to 10mg  once a day. You can take two of your 5mg  tablets until you need a refill  I will also send an upper arm cuff for you  Try to reduce your sodium intake  Joelene Murrain, PharmD, BCACP, CDCES, CPP [address] [place], [place], [address] Phone: [phone] Fax: [phone]     1st Floor: - Lobby - Registration  - Pharmacy  - Lab - Cafe  2nd Floor: - PV Lab - Diagnostic Testing (echo, CT, nuclear med)  3rd Floor: - Vacant  4th Floor: - TCTS (cardiothoracic surgery) - AFib Clinic - Structural Heart Clinic - Vascular Surgery  - Vascular Ultrasound  5th Floor: - HeartCare Cardiology (general and EP) - Clinical Pharmacy for coumadin, hypertension, lipid, weight-loss medications, and med management appointments    Valet parking services will be available as well.

## 2023-11-06 NOTE — Progress Notes (Signed)
 Patient ID: Rachel Salas                 DOB: 1975-09-18                      MRN: [id number]     HPI: Rachel Salas is a 48 y.o. female referred by Dr Lincoln Renshaw to Cardio OB clinic. PMH is significant for HTN, psuedotumor cerebri, and anemia.   Patient presents today for HTN follow up. At last visit with Dr Emmette Harms, metiprolol was d/c and patient was started on carveidlol and amlodipine . Has not noticed any significnt BP reduction.  Reports recent home reading of 131/92. Uses a wrist cuff at home.   She is currently in weight loss program at [facility]. On Wegovy  1mg  weekly.  Denies stress or worry. Endorses eating salt and drinking caffeine.  Current HTN meds:  Amlodipine  5mg  daily  Wt Readings from Last 3 Encounters:  11/06/23 216 lb 8 oz (98.2 kg)  10/08/23 216 lb 3.2 oz (98.1 kg)  07/08/23 195 lb (88.5 kg)   BP Readings from Last 3 Encounters:  10/08/23 (!) 152/100  07/08/23 (!) 150/90  05/29/23 (!) 137/92   Pulse Readings from Last 3 Encounters:  10/08/23 81  07/08/23 83  05/29/23 75    Renal function: Estimated Creatinine Clearance: 94.6 mL/min (by C-G formula based on SCr of 0.92 mg/dL).  Past Medical History:  Diagnosis Date   Adenomyosis 11/15/2014   u/s suspicious for adenomyosis   Essential hypertension, benign 10/14/2012   GERD (gastroesophageal reflux disease)    Hypertension    Hypokalemia    IDA (iron  deficiency anemia)    Menorrhagia with irregular cycle    Pseudotumor cerebri dx 2007   Severe obesity (HCC)     Current Outpatient Medications on File Prior to Visit  Medication Sig Dispense Refill   amLODipine  (NORVASC ) 5 MG tablet Take 1 tablet (5 mg total) by mouth daily. 180 tablet 3   buPROPion  (WELLBUTRIN  XL) 300 MG 24 hr tablet Take 1 tablet (300 mg) by mouth daily. 90 tablet 1   carvedilol  (COREG ) 6.25 MG tablet Take 1 tablet (6.25 mg total) by mouth 2 (two) times daily. 180 tablet 3   cyclobenzaprine  (FLEXERIL ) 10 MG tablet Take 1 tablet (10 mg  total) by mouth at bedtime if needed. 30 tablet 2   EPINEPHrine 0.3 mg/0.3 mL IJ SOAJ injection Inject 0.32mL into the muscle one time as needed for anaphylaxis     losartan  (COZAAR ) 100 MG tablet Take 1 tablet (100 mg total) by mouth daily. 90 tablet 1   ondansetron  (ZOFRAN ) 4 MG tablet Take 1 tablet (4 mg total) by mouth every 8 (eight) hours as needed for nausea or vomiting. 60 tablet 3   Semaglutide -Weight Management (WEGOVY ) 2.4 MG/0.75ML SOAJ Inject 2.4 mg into the skin once a week. 9 mL 1   valACYclovir  (VALTREX ) 1000 MG tablet Take 2 tablets (2,000 mg total) by mouth 2 (two) times daily for 1 day. Start as soon as you feel the symptoms. 120 tablet 5   fluconazole  (DIFLUCAN ) 150 MG tablet Take 1 tablet (150 mg total) by mouth every 3 (three) days as needed. 2 tablet 0   metoprolol  tartrate (LOPRESSOR ) 100 MG tablet Take 2 hours prior to CT 1 tablet 0   nitrofurantoin , macrocrystal-monohydrate, (MACROBID ) 100 MG capsule Take 1 capsule (100 mg total) by mouth 2 (two) times daily. 10 capsule 0   No current facility-administered medications on file  prior to visit.    Allergies  Allergen Reactions   Naproxen Sodium Other (See Comments)    On second dose of Aleve, pt presented to ED with severe urticaria and swelling of lips. Pt did not require Epi, but received remainder of allergic reaction treatment. --Ione Manly, MD   Tylenol [Acetaminophen] Hives   Other Palpitations    FEREHEME- CHEST HEAVINESS AND ARM PAIN     Assessment/Plan:  1. Hypertension -  Patient BP in room today 148/104 which is above goal of <140/90 for primary prevention. High salt and caffiene in diet may be contributing. Recommended reducing and increase water consumption.   Will increase amlodipine  to 10mg  once daily. Counseled on possible adverse effects such as swelling. Recommended she take two 5mg  tablets once daily until she needs new med. Will follow up in 4-6 weeks.  Increase amlodipine  to 10mg  once  daily Recheck in 4-6 weeks  Joelene Murrain, PharmD, BCACP, CDCES, CPP [address] [place], [place], [address] Phone: [phone], Fax: [phone]

## 2023-11-09 ENCOUNTER — Encounter: Payer: Self-pay | Admitting: Cardiology

## 2023-11-11 DIAGNOSIS — F411 Generalized anxiety disorder: Secondary | ICD-10-CM | POA: Insufficient documentation

## 2023-11-13 ENCOUNTER — Encounter: Payer: Self-pay | Admitting: Cardiology

## 2023-11-16 ENCOUNTER — Other Ambulatory Visit: Payer: Self-pay

## 2023-11-16 MED ORDER — VITAMIN D (ERGOCALCIFEROL) 1.25 MG (50000 UNIT) PO CAPS: 50000.0000 [IU] | ORAL_CAPSULE | ORAL | 0 refills | Status: DC

## 2023-11-16 NOTE — Progress Notes (Signed)
 Prescription sent to pharmacy.

## 2023-11-30 ENCOUNTER — Encounter (HOSPITAL_COMMUNITY): Payer: Self-pay

## 2023-12-01 ENCOUNTER — Encounter: Payer: Self-pay | Admitting: Internal Medicine

## 2023-12-01 ENCOUNTER — Other Ambulatory Visit (HOSPITAL_COMMUNITY): Payer: Self-pay

## 2023-12-02 ENCOUNTER — Ambulatory Visit (HOSPITAL_COMMUNITY)
Admission: RE | Admit: 2023-12-02 | Discharge: 2023-12-02 | Disposition: A | Discharge status: Home / Self Care | Source: Ambulatory Visit | Attending: Cardiology | Admitting: Cardiology

## 2023-12-02 DIAGNOSIS — I2510 Atherosclerotic heart disease of native coronary artery without angina pectoris: Secondary | ICD-10-CM | POA: Diagnosis present

## 2023-12-02 MED ORDER — NITROGLYCERIN 0.4 MG SL SUBL
0.8000 mg | SUBLINGUAL_TABLET | Freq: Once | SUBLINGUAL | Status: AC
  Administered 2023-12-02: 0.8 mg via SUBLINGUAL

## 2023-12-02 MED ORDER — IOHEXOL 350 MG/ML SOLN
100.0000 mL | Freq: Once | INTRAVENOUS | Status: AC | PRN
  Administered 2023-12-02: 100 mL via INTRAVENOUS

## 2023-12-02 MED ORDER — NITROGLYCERIN 0.4 MG SL SUBL
SUBLINGUAL_TABLET | SUBLINGUAL | Status: AC
  Filled 2023-12-02: qty 2

## 2023-12-02 MED ORDER — METOPROLOL TARTRATE 5 MG/5ML IV SOLN
INTRAVENOUS | Status: AC
  Filled 2023-12-02: qty 10

## 2023-12-02 MED ORDER — METOPROLOL TARTRATE 5 MG/5ML IV SOLN
5.0000 mg | Freq: Once | INTRAVENOUS | Status: AC
  Administered 2023-12-02: 5 mg via INTRAVENOUS

## 2023-12-25 ENCOUNTER — Ambulatory Visit: Admitting: Pharmacist

## 2024-01-01 ENCOUNTER — Ambulatory Visit: Payer: Self-pay | Admitting: Cardiology

## 2024-01-11 ENCOUNTER — Other Ambulatory Visit: Payer: Self-pay

## 2024-01-11 MED ORDER — ASPIRIN 81 MG PO TBEC: 81.0000 mg | DELAYED_RELEASE_TABLET | Freq: Every day | ORAL | Status: AC

## 2024-01-11 MED ORDER — ATORVASTATIN CALCIUM 20 MG PO TABS: 20.0000 mg | ORAL_TABLET | Freq: Every day | ORAL | 3 refills | Status: AC

## 2024-01-11 NOTE — Progress Notes (Signed)
 Prescription for Lipitor 20 mg once daily and Aspirin 81 mg once daily sent to preferred pharmacy.

## 2024-02-12 ENCOUNTER — Ambulatory Visit: Attending: Internal Medicine | Admitting: Pharmacist

## 2024-02-12 ENCOUNTER — Encounter: Payer: Self-pay | Admitting: Pharmacist

## 2024-02-12 VITALS — BP 132/87 | HR 72 | Wt 203.4 lb

## 2024-02-12 DIAGNOSIS — I2510 Atherosclerotic heart disease of native coronary artery without angina pectoris: Secondary | ICD-10-CM

## 2024-02-12 DIAGNOSIS — I10 Essential (primary) hypertension: Secondary | ICD-10-CM

## 2024-02-12 MED ORDER — LOSARTAN POTASSIUM 100 MG PO TABS: 100.0000 mg | ORAL_TABLET | Freq: Every day | ORAL | 1 refills | Status: AC

## 2024-02-12 NOTE — Patient Instructions (Addendum)
 Your blood pressure looks better today but still a little higher than we like  Please start your amlodipine  10mg  once a day and continue your losartan  100mg  daily and carvedilol  6.25mg  twice a day  I recommend starting your atorvastatin  20mg  daily  Please give us  a call or mychart message with any issues  Medford Bolk, PharmD, BCACP, CDCES, CPP [facility] [address], [place], [place] [address] Phone: [phone]; Fax: [phone] 02/12/2024 11:01 AM

## 2024-02-12 NOTE — Telephone Encounter (Signed)
 This encounter was created in error - please disregard.

## 2024-02-12 NOTE — Progress Notes (Signed)
 Patient ID: Rachel Salas                 DOB: [birth year]                      MRN: [id number]     HPI: Rachel Salas is a 48 y.o. female referred by Dr Vicci to Cardio OB clinic. PMH is significant for HTN, psuedotumor cerebri, and anemia.   Patient presents today for HTN follow up. At first visit with Dr Sheena, metiprolol was d/c and patient was started on carveidlol and amlodipine .  At first PharmD visit, BP was 151/110. Amlodipine  was increased to 10mg  daily.  Patient presents today for HTN follow up. Has not increased amlodipine  to 10mg . Has not started atorvastatin  or aspirin . Does not like to take medication. Has continued taking carvedilol  and losartan .  She has been switched off bupropion  to fluoxetine. Her Wegovy  has been increased to max dose and she has had weight loss.  Has not started aspirin  because she has had an allergic reaction to naproxen and acetaminophen.   Works as a Arboriculturist for [employer].  Current HTN meds:  Amlodipine  5mg  daily Losartan  100mg  daily Carvedilol  6.25mg  BID  Wt Readings from Last 3 Encounters:  11/06/23 216 lb 8 oz (98.2 kg)  10/08/23 216 lb 3.2 oz (98.1 kg)  07/08/23 195 lb (88.5 kg)   BP Readings from Last 3 Encounters:  12/02/23 138/85  11/06/23 (!) 151/110  10/08/23 (!) 152/100   Pulse Readings from Last 3 Encounters:  12/02/23 92  11/06/23 86  10/08/23 81    Renal function: CrCl cannot be calculated (Patient's most recent lab result is older than the maximum 21 days allowed.).  Past Medical History:  Diagnosis Date   Adenomyosis 11/15/2014   u/s suspicious for adenomyosis   Essential hypertension, benign 10/14/2012   GERD (gastroesophageal reflux disease)    Hypertension    Hypokalemia    IDA (iron  deficiency anemia)    Menorrhagia with irregular cycle    Pseudotumor cerebri dx 2007   Severe obesity (HCC)     Current Outpatient Medications on File Prior to Visit  Medication Sig Dispense Refill    amLODipine  (NORVASC ) 10 MG tablet Take 1 tablet (10 mg total) by mouth daily. 180 tablet 3   aspirin  EC 81 MG tablet Take 1 tablet (81 mg total) by mouth daily. Swallow whole.     atorvastatin  (LIPITOR) 20 MG tablet Take 1 tablet (20 mg total) by mouth daily. 90 tablet 3   Blood Pressure Monitoring (OMRON 3 SERIES BP MONITOR) DEVI 1 Device by Does not apply route daily. 1 each 0   carvedilol  (COREG ) 6.25 MG tablet Take 1 tablet (6.25 mg total) by mouth 2 (two) times daily. 180 tablet 3   cyclobenzaprine  (FLEXERIL ) 10 MG tablet Take 1 tablet (10 mg total) by mouth at bedtime if needed. 30 tablet 2   EPINEPHrine 0.3 mg/0.3 mL IJ SOAJ injection Inject 0.49mL into the muscle one time as needed for anaphylaxis     losartan  (COZAAR ) 100 MG tablet Take 1 tablet (100 mg total) by mouth daily. 90 tablet 1   ondansetron  (ZOFRAN ) 4 MG tablet Take 1 tablet (4 mg total) by mouth every 8 (eight) hours as needed for nausea or vomiting. 60 tablet 3   Semaglutide -Weight Management (WEGOVY ) 2.4 MG/0.75ML SOAJ Inject 2.4 mg into the skin once a week. 9 mL 1   valACYclovir  (VALTREX ) 1000 MG tablet  Take 2 tablets (2,000 mg total) by mouth 2 (two) times daily for 1 day. Start as soon as you feel the symptoms. 120 tablet 5   Vitamin D , Ergocalciferol , (DRISDOL ) 1.25 MG (50000 UNIT) CAPS capsule Take 1 capsule (50,000 Units total) by mouth every 7 (seven) days. 12 capsule 0   No current facility-administered medications on file prior to visit.    Allergies  Allergen Reactions   Naproxen Sodium Other (See Comments)    On second dose of Aleve, pt presented to ED with severe urticaria and swelling of lips. Pt did not require Epi, but received remainder of allergic reaction treatment. --Leita Phenix, MD   Tylenol [Acetaminophen] Hives   Other Palpitations    FEREHEME- CHEST HEAVINESS AND ARM PAIN     Assessment/Plan:  1. Hypertension -  Patient BP in room 127/90 and rechecked at 132/87. Both are above goal of <130/80  due to CAD. Encouraged patient to increase amlodipine  to 10mg  daily and start atorvastatin  20ng as previously recommended. There is a chance of allergic cross reactivity between aspirin  and naproxen so she will continue to hold. Will let Dr Sheena know. Recheck in clinic in 6 weeks.  Continue losartan  100mg  daily Continue carvedilol  6.25mg  BID Increase amlodipine  to 20mg  daily F/u in 6 weeks  Medford Bolk, PharmD, BCACP, CDCES, CPP [facility] [address] [place], [place] [address] Phone: [phone]; Fax: [phone] 02/12/2024 11:39 AM     Medford Bolk, PharmD, BCACP, CDCES, CPP [address] [place], [place], [address] Phone: [phone], Fax: [phone]

## 2024-03-31 ENCOUNTER — Ambulatory Visit: Admitting: Pharmacist

## 2024-04-29 ENCOUNTER — Ambulatory Visit: Admitting: Pharmacist

## 2024-04-29 VITALS — BP 110/84 | HR 85

## 2024-04-29 DIAGNOSIS — I10 Essential (primary) hypertension: Secondary | ICD-10-CM | POA: Diagnosis not present

## 2024-04-29 DIAGNOSIS — I2510 Atherosclerotic heart disease of native coronary artery without angina pectoris: Secondary | ICD-10-CM

## 2024-04-29 NOTE — Patient Instructions (Addendum)
 Good seeing you again!  Your blood pressure is much improved  Continue your: Carvedilol  6.25mg  twice a day Losartan  100mg  daily Amlodipine  10mg  daily  If you noticed more palpitations or they are bothersome, please let us  know  Continue to watch you salt and caffeine intake  Medford Bolk, PharmD, BCACP, CDCES, CPP [facility] [address] [place], [place] [address] Phone: [phone]; Fax: [phone] 04/29/2024 8:35 AM

## 2024-04-29 NOTE — Progress Notes (Signed)
 Patient ID: Rachel Salas                 DOB: March 31, 1976                      MRN: [id number]     HPI: Rachel Salas is a 48 y.o. female referred by Dr Vicci to Cardio OB clinic. PMH is significant for HTN, psuedotumor cerebri, and anemia.   Patient presents today for HTN follow up. At first visit with Dr Sheena, metoprolol  was d/c and patient was started on carvedilol  and amlodipine .  At first PharmD visit, BP was 151/110. Amlodipine  was increased to 10mg  daily but she did not start increased dosage for approximately one month later due to not wanting to take more medications  She has been switched off bupropion  to fluoxetine. Her Wegovy  has been increased to max dose and she has had weight loss.  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  Current HTN meds:  Amlodipine  10mg  daily Losartan  100mg  daily Carvedilol  6.25mg  BID  Wt Readings from Last 3 Encounters:  11/06/23 216 lb 8 oz (98.2 kg)  10/08/23 216 lb 3.2 oz (98.1 kg)  07/08/23 195 lb (88.5 kg)   BP Readings from Last 3 Encounters:  12/02/23 138/85  11/06/23 (!) 151/110  10/08/23 (!) 152/100   Pulse Readings from Last 3 Encounters:  12/02/23 92  11/06/23 86  10/08/23 81    Renal function: CrCl cannot be calculated (Patient's most recent lab result is older than the maximum 21 days allowed.).  Past Medical History:  Diagnosis Date   Adenomyosis 11/15/2014   u/s suspicious for adenomyosis   Essential hypertension, benign 10/14/2012   GERD (gastroesophageal reflux disease)    Hypertension    Hypokalemia    IDA (iron  deficiency anemia)    Menorrhagia with irregular cycle    Pseudotumor cerebri dx 2007   Severe obesity (HCC)     Current Outpatient Medications on File Prior to Visit  Medication Sig Dispense Refill   amLODipine  (NORVASC ) 10 MG tablet Take 1 tablet (10 mg total) by mouth daily. 180 tablet 3   aspirin  EC 81 MG tablet Take 1  tablet (81 mg total) by mouth daily. Swallow whole.     atorvastatin  (LIPITOR) 20 MG tablet Take 1 tablet (20 mg total) by mouth daily. 90 tablet 3   Blood Pressure Monitoring (OMRON 3 SERIES BP MONITOR) DEVI 1 Device by Does not apply route daily. 1 each 0   carvedilol  (COREG ) 6.25 MG tablet Take 1 tablet (6.25 mg total) by mouth 2 (two) times daily. 180 tablet 3   cyclobenzaprine  (FLEXERIL ) 10 MG tablet Take 1 tablet (10 mg total) by mouth at bedtime if needed. 30 tablet 2   EPINEPHrine 0.3 mg/0.3 mL IJ SOAJ injection Inject 0.64mL into the muscle one time as needed for anaphylaxis     losartan  (COZAAR ) 100 MG tablet Take 1 tablet (100 mg total) by mouth daily. 90 tablet 1   ondansetron  (ZOFRAN ) 4 MG tablet Take 1 tablet (4 mg total) by mouth every 8 (eight) hours as needed for nausea or vomiting. 60 tablet 3   Semaglutide -Weight Management (WEGOVY ) 2.4 MG/0.75ML SOAJ Inject 2.4 mg into the skin once a week. 9 mL 1   valACYclovir  (VALTREX ) 1000 MG tablet Take 2 tablets (2,000 mg total) by mouth 2 (two) times daily for 1 day. Start as soon as you feel the  symptoms. 120 tablet 5   Vitamin D , Ergocalciferol , (DRISDOL ) 1.25 MG (50000 UNIT) CAPS capsule Take 1 capsule (50,000 Units total) by mouth every 7 (seven) days. 12 capsule 0   No current facility-administered medications on file prior to visit.    Allergies  Allergen Reactions   Naproxen Sodium Other (See Comments)    On second dose of Aleve, pt presented to ED with severe urticaria and swelling of lips. Pt did not require Epi, but received remainder of allergic reaction treatment. --Leita Phenix, MD   Tylenol [Acetaminophen] Hives   Other Palpitations    FEREHEME- CHEST HEAVINESS AND ARM PAIN     Assessment/Plan:  1. Hypertension -  Patient BP in room 110/84 and rechecked at 108/80 which are both improved. Diastolic readings still slightly above goal. Encouraged continued medication compliance. Advised that if palpitations are  bothersome we could try to increase carvedilol  or order a monitor. Patient declines. No changes needed at this time.  Continue losartan  100mg  daily Continue carvedilol  6.25mg  BID Continue amlodipine  to 10mg  daily F/u in 6 weeks  Medford Bolk, PharmD, BCACP, CDCES, CPP [facility] [address] [place], [place] [address] Phone: [phone]; Fax: [phone] 05/02/2024 9:36 AM

## 2024-05-02 ENCOUNTER — Encounter: Payer: Self-pay | Admitting: Pharmacist

## 2024-05-11 NOTE — Progress Notes (Deleted)
 Office Visit Note  Patient: Rachel Salas             Date of Birth: 1976/02/26           MRN: [id number]             PCP: Vicci Duwaine SQUIBB, DO Referring: Vicci Duwaine SQUIBB, DO Visit Date: 05/25/2024 Occupation: Data Unavailable  Subjective:  No chief complaint on file.   History of Present Illness: Rachel Salas is a 48 y.o. female ***     Activities of Daily Living:  Patient reports morning stiffness for *** {minute/hour:19697}.   Patient {ACTIONS;DENIES/REPORTS:21021675::Denies} nocturnal pain.  Difficulty dressing/grooming: {ACTIONS;DENIES/REPORTS:21021675::Denies} Difficulty climbing stairs: {ACTIONS;DENIES/REPORTS:21021675::Denies} Difficulty getting out of chair: {ACTIONS;DENIES/REPORTS:21021675::Denies} Difficulty using Mosey for taps, buttons, cutlery, and/or writing: {ACTIONS;DENIES/REPORTS:21021675::Denies}  No Rheumatology ROS completed.   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   Iron  deficiency anemia due to chronic blood loss 07/02/2018   Allergic reaction 05/04/2015   Snoring 12/19/2014   History of morbid obesity 07/30/2014   Heavy periods 07/30/2014   Hypokalemia 07/26/2014   Undiagnosed cardiac murmurs 05/21/2014   GERD (gastroesophageal reflux disease) 10/14/2012   Pseudotumor cerebri 10/14/2012   Chronic constipation 10/14/2012   Anemia 10/14/2012    Past Medical History:  Diagnosis Date   Adenomyosis 11/15/2014   u/s suspicious for adenomyosis   Essential hypertension, benign 10/14/2012   GERD (gastroesophageal reflux disease)    Hypertension    Hypokalemia    IDA (iron  deficiency anemia)    Menorrhagia with irregular cycle    Pseudotumor cerebri dx 2007   Severe obesity (HCC)     Family  History  Problem Relation Age of Onset   Hypertension Mother    Hypercholesterolemia Mother    Thyroid  disease Mother    Healthy Brother    Stroke Maternal Grandmother    Healthy Daughter    Healthy Son    Healthy Son    Colon cancer Neg Hx    Breast cancer Neg Hx    Ovarian cancer Neg Hx    Heart disease Neg Hx    Past Surgical History:  Procedure Laterality Date   TONSILLECTOMY  2007   TUBAL LIGATION  2002   Social History   Tobacco Use   Smoking status: Never    Passive exposure: Past   Smokeless tobacco: Never  Vaping Use   Vaping status: Never Used  Substance Use Topics   Alcohol use: Yes    Alcohol/week: 0.0 standard drinks of alcohol    Comment: Rare/Occasional   Drug use: No   Social History   Social History Narrative   2-3 caffeine drinks a week       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 No smoking; occasional alcohol.      Immunization History  Administered Date(s) Administered   HPV 9-valent 11/20/2017, 01/28/2018, 06/10/2018   Influenza Split 04/28/2014, 03/31/2016   Influenza,inj,Quad PF,6+ Mos 04/15/2019, 04/08/2021   Influenza-Unspecified 04/09/2017, 04/20/2018, 05/18/2020, 05/08/2023   PFIZER(Purple Top)SARS-COV-2 Vaccination 08/02/2019, 08/22/2019   PPD Test 08/21/2018   Tdap 09/29/2013     Objective: Vital Signs: There were no vitals taken for this visit.   Physical Exam   Musculoskeletal Exam: ***  CDAI Exam: CDAI Score: -- Patient  Global: --; Provider Global: -- Swollen: --; Tender: -- Joint Exam 05/25/2024   No joint exam has been documented for this visit   There is currently no information documented on the homunculus. Go to the Rheumatology activity and complete the homunculus joint exam.  Investigation: No additional findings.  Imaging: No results found.  Recent Labs: Lab Results  Component Value Date   WBC 3.8 07/08/2023   HGB 10.2 (L) 07/08/2023   PLT 177 07/08/2023   NA 141 10/27/2023    K 3.4 (L) 10/27/2023   CL 106 10/27/2023   CO2 21 10/27/2023   GLUCOSE 96 10/27/2023   BUN 15 10/27/2023   CREATININE 0.92 10/27/2023   BILITOT <0.2 10/27/2023   ALKPHOS 83 10/27/2023   AST 12 10/27/2023   ALT 6 10/27/2023   PROT 6.7 10/27/2023   ALBUMIN 3.9 10/27/2023   CALCIUM  8.7 10/27/2023   GFRAA 82 05/22/2020   QFTBGOLDPLUS Negative 08/21/2017    Speciality Comments: No specialty comments available.  Procedures:  No procedures performed Allergies: Naproxen sodium, Tylenol [acetaminophen], and Other   Assessment / Plan:     Visit Diagnoses: No diagnosis found.  Orders: No orders of the defined types were placed in this encounter.  No orders of the defined types were placed in this encounter.   Face-to-face time spent with patient was *** minutes. Greater than 50% of time was spent in counseling and coordination of care.  Follow-Up Instructions: No follow-ups on file.   Daved JAYSON Gavel, CMA  Note - This record has been created using Animal nutritionist.  Chart creation errors have been sought, but may not always  have been located. Such creation errors do not reflect on  the standard of medical care.

## 2024-05-25 ENCOUNTER — Ambulatory Visit: Payer: 59 | Admitting: Rheumatology

## 2024-05-25 DIAGNOSIS — F321 Major depressive disorder, single episode, moderate: Secondary | ICD-10-CM

## 2024-05-25 DIAGNOSIS — R7689 Other specified abnormal immunological findings in serum: Secondary | ICD-10-CM

## 2024-05-25 DIAGNOSIS — B001 Herpesviral vesicular dermatitis: Secondary | ICD-10-CM

## 2024-05-25 DIAGNOSIS — K8020 Calculus of gallbladder without cholecystitis without obstruction: Secondary | ICD-10-CM

## 2024-05-25 DIAGNOSIS — M25649 Stiffness of unspecified hand, not elsewhere classified: Secondary | ICD-10-CM

## 2024-05-25 DIAGNOSIS — G932 Benign intracranial hypertension: Secondary | ICD-10-CM

## 2024-05-25 DIAGNOSIS — K219 Gastro-esophageal reflux disease without esophagitis: Secondary | ICD-10-CM

## 2024-05-25 DIAGNOSIS — I10 Essential (primary) hypertension: Secondary | ICD-10-CM

## 2024-05-25 DIAGNOSIS — K5909 Other constipation: Secondary | ICD-10-CM

## 2024-05-25 DIAGNOSIS — D508 Other iron deficiency anemias: Secondary | ICD-10-CM

## 2024-05-25 DIAGNOSIS — L817 Pigmented purpuric dermatosis: Secondary | ICD-10-CM

## 2024-07-06 ENCOUNTER — Other Ambulatory Visit (HOSPITAL_COMMUNITY): Payer: Self-pay

## 2024-07-06 ENCOUNTER — Encounter: Payer: Self-pay | Admitting: Internal Medicine

## 2024-07-06 ENCOUNTER — Telehealth: Payer: Self-pay | Admitting: Pharmacy Technician

## 2024-07-06 NOTE — Telephone Encounter (Signed)
 Pharmacy Patient Advocate Encounter   Received notification from Onbase that prior authorization for Wegovy  2.4MG /0.75ML auto-injectors is due for renewal.   Insurance verification completed.   The patient is insured through [facility].  Action: PA required; PA submitted to above mentioned insurance via Latent Key/confirmation #/EOC [id number] Status is pending

## 2024-07-07 NOTE — Telephone Encounter (Signed)
 Pharmacy Patient Advocate Encounter  Received notification from [facility] that Prior Authorization for  Wegovy  2.4MG /0.75ML auto-injectors  has been CANCELLED due to   PA #/Case ID/Reference #: [id number]

## 2024-07-26 ENCOUNTER — Encounter: Payer: Self-pay | Admitting: Internal Medicine

## 2024-07-27 LAB — HM MAMMOGRAPHY

## 2024-07-28 ENCOUNTER — Encounter: Payer: Self-pay | Admitting: Internal Medicine

## 2024-08-03 ENCOUNTER — Encounter: Payer: Self-pay | Admitting: Internal Medicine

## 2024-08-03 ENCOUNTER — Ambulatory Visit: Attending: Cardiology | Admitting: Cardiology

## 2024-08-03 ENCOUNTER — Encounter: Payer: Self-pay | Admitting: Cardiology

## 2024-08-03 VITALS — BP 110/82 | HR 83 | Ht 68.0 in | Wt 179.6 lb

## 2024-08-03 DIAGNOSIS — I2510 Atherosclerotic heart disease of native coronary artery without angina pectoris: Secondary | ICD-10-CM

## 2024-08-03 DIAGNOSIS — I10 Essential (primary) hypertension: Secondary | ICD-10-CM

## 2024-08-03 DIAGNOSIS — E785 Hyperlipidemia, unspecified: Secondary | ICD-10-CM | POA: Diagnosis not present

## 2024-08-03 NOTE — Patient Instructions (Signed)
 Medication Instructions:  Your physician recommends that you continue on your current medications as directed. Please refer to the Current Medication list given to you today.  *If you need a refill on your cardiac medications before your next appointment, please call your pharmacy*  Lab Work: Lipids If you have labs (blood work) drawn today and your tests are completely normal, you will receive your results only by: MyChart Message (if you have MyChart) OR A paper copy in the mail If you have any lab test that is abnormal or we need to change your treatment, we will call you to review the results.  Follow-Up: At [facility], you and your health needs are our priority.  As part of our continuing mission to provide you with exceptional heart care, our providers are all part of one team.  This team includes your primary Cardiologist (physician) and Advanced Practice Providers or APPs (Physician Assistants and Nurse Practitioners) who all work together to provide you with the care you need, when you need it.  Your next appointment:   1 year(s)  Provider:   Kardie Tobb, DO    Other Instructions:

## 2024-08-03 NOTE — Progress Notes (Signed)
 " Cardiology Office Note:    Date:  08/03/2024   ID:  Rachel Salas, DOB 05/29/1976, MRN [id number]  PCP:  Vicci Duwaine SQUIBB, DO  Cardiologist:  Aijah Lattner, DO  Electrophysiologist:  None   Referring MD: Vicci Duwaine SQUIBB, DO    I am doing well   History of Present Illness:    Rachel Salas is a 49 y.o. female with a hx of Pseudotumor cerebri, coronary artery disease seen on coronary CTA in May 2025, essential hypertension, GERD and Iron  deficiency anemia.   Since her visit with me she has follow-up with our pharmacy clinic and is doing well.  Blood pressure is under control.  She denies any chest pain or shortness of breath.   Past Medical History:  Diagnosis Date   Adenomyosis 11/15/2014   u/s suspicious for adenomyosis   Essential hypertension, benign 10/14/2012   GERD (gastroesophageal reflux disease)    Hypertension    Hypokalemia    IDA (iron  deficiency anemia)    Menorrhagia with irregular cycle    Pseudotumor cerebri dx 2007   Severe obesity (HCC)     Past Surgical History:  Procedure Laterality Date   TONSILLECTOMY  2007   TUBAL LIGATION  2002    Current Medications: Current Meds  Medication Sig   amLODipine  (NORVASC ) 10 MG tablet Take 0.5 tablets (5 mg total) by mouth daily.   aspirin  EC 81 MG tablet Take 1 tablet (81 mg total) by mouth daily. Swallow whole.   atorvastatin  (LIPITOR) 20 MG tablet Take 1 tablet (20 mg total) by mouth daily.   Blood Pressure Monitoring (OMRON 3 SERIES BP MONITOR) DEVI 1 Device by Does not apply route daily.   carvedilol  (COREG ) 6.25 MG tablet Take 1 tablet (6.25 mg total) by mouth 2 (two) times daily.   cyclobenzaprine  (FLEXERIL ) 10 MG tablet Take 1 tablet (10 mg total) by mouth at bedtime if needed.   EPINEPHrine 0.3 mg/0.3 mL IJ SOAJ injection Inject 0.81mL into the muscle one time as needed for anaphylaxis   FLUoxetine (PROZAC) 40 MG capsule Take 40 mg by mouth every morning.   losartan  (COZAAR ) 100 MG tablet Take 1 tablet  (100 mg total) by mouth daily.   ondansetron  (ZOFRAN ) 4 MG tablet Take 1 tablet (4 mg total) by mouth every 8 (eight) hours as needed for nausea or vomiting.   Semaglutide -Weight Management (WEGOVY ) 2.4 MG/0.75ML SOAJ Inject 2.4 mg into the skin once a week.   valACYclovir  (VALTREX ) 1000 MG tablet Take 2 tablets (2,000 mg total) by mouth 2 (two) times daily for 1 day. Start as soon as you feel the symptoms.     Allergies:   Naproxen sodium, Tylenol [acetaminophen], and Other   Social History   Socioeconomic History   Marital status: Single    Spouse name: Not on file   Number of children: 3   Years of education: BS   Highest education level: Master's degree (e.g., MA, MS, MEng, MEd, MSW, MBA)  Occupational History    Employer: [employer]  Tobacco Use   Smoking status: Never    Passive exposure: Past   Smokeless tobacco: Never  Vaping Use   Vaping status: Never Used  Substance and Sexual Activity   Alcohol use: Yes    Alcohol/week: 0.0 standard drinks of alcohol    Comment: Rare/Occasional   Drug use: No   Sexual activity: Yes    Partners: Male    Birth control/protection: Surgical, None    Comment:  BTL  Other Topics Concern   Not on file  Social History Narrative   2-3 caffeine drinks a week       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 No smoking; occasional alcohol.    Social Drivers of Health   Tobacco Use: Low Risk (08/03/2024)   Patient History    Smoking Tobacco Use: Never    Smokeless Tobacco Use: Never    Passive Exposure: Past  Financial Resource Strain: Low Risk (10/12/2023)   Received from [facility]   Overall Financial Resource Strain (CARDIA)    Difficulty of Paying Living Expenses: Not hard at all  Food Insecurity: No Food Insecurity (10/12/2023)   Received from [facility]   Epic    Within the past 12 months, you worried that your food would run out before you got the money to buy more.: Never true    Within the past 12 months,  the food you bought just didn't last and you didn't have money to get more.: Never true  Transportation Needs: No Transportation Needs (10/12/2023)   Received from [facility] - Transportation    Lack of Transportation (Medical): No    Lack of Transportation (Non-Medical): No  Physical Activity: Insufficiently Active (10/12/2023)   Received from [facility]   Exercise Vital Sign    On average, how many days per week do you engage in moderate to strenuous exercise (like a brisk walk)?: 3 days    On average, how many minutes do you engage in exercise at this level?: 20 min  Stress: No Stress Concern Present (10/12/2023)   Received from [facility] - Occupational Stress Questionnaire    Feeling of Stress : Not at all  Social Connections: Socially Integrated (10/12/2023)   Received from [facility]   Social Network    How would you rate your social network (family, work, friends)?: Good participation with social networks  Depression (PHQ2-9): Low Risk (07/08/2023)   Depression (PHQ2-9)    PHQ-2 Score: 0  Alcohol Screen: Low Risk (07/07/2023)   Alcohol Screen    Last Alcohol Screening Score (AUDIT): 1  Housing: Low Risk (10/12/2023)   Received from [facility]    In the last 12 months, was there a time when you were not able to pay the mortgage or rent on time?: No    In the past 12 months, how many times have you moved where you were living?: 0    At any time in the past 12 months, were you homeless or living in a shelter (including now)?: No  Utilities: Not At Risk (10/12/2023)   Received from [facility] Utilities    Threatened with loss of utilities: No  Health Literacy: Not on file     Family History: The patient's family history includes Healthy in her brother, daughter, son, and son; Hypercholesterolemia in her mother; Hypertension in her mother; Stroke in her maternal grandmother; Thyroid  disease in her  mother. There is no history of Colon cancer, Breast cancer, Ovarian cancer, or Heart disease.  ROS:   Review of Systems  Constitution: Negative for decreased appetite, fever and weight gain.  HENT: Negative for congestion, ear discharge, hoarse voice and sore throat.   Eyes: Negative for discharge, redness, vision loss in right eye and visual halos.  Cardiovascular: Reports palpitations. Negative for chest pain, dyspnea on exertion, leg swelling, orthopnea and palpitations.  Respiratory:  Negative for cough, hemoptysis, shortness of breath and snoring.   Endocrine: Negative for heat intolerance and polyphagia.  Hematologic/Lymphatic: Negative for bleeding problem. Does not bruise/bleed easily.  Skin: Negative for flushing, nail changes, rash and suspicious lesions.  Musculoskeletal: Negative for arthritis, joint pain, muscle cramps, myalgias, neck pain and stiffness.  Gastrointestinal: Negative for abdominal pain, bowel incontinence, diarrhea and excessive appetite.  Genitourinary: Negative for decreased libido, genital sores and incomplete emptying.  Neurological: Negative for brief paralysis, focal weakness, headaches and loss of balance.  Psychiatric/Behavioral: Negative for altered mental status, depression and suicidal ideas.  Allergic/Immunologic: Negative for HIV exposure and persistent infections.    EKGs/Labs/Other Studies Reviewed:    The following studies were reviewed today:   EKG:  The ekg ordered today demonstrates sinus rhythm  Recent Labs: 10/27/2023: ALT 6; BUN 15; Creatinine, Ser 0.92; Magnesium 1.6; Potassium 3.4; Sodium 141  Recent Lipid Panel    Component Value Date/Time   CHOL 131 07/08/2023 0840   TRIG 55 07/08/2023 0840   HDL 53 07/08/2023 0840   CHOLHDL 3 03/13/2015 0954   VLDL 7.8 03/13/2015 0954   LDLCALC 66 07/08/2023 0840    Physical Exam:    VS:  BP 110/82 (BP Location: Left Arm, Patient Position: Sitting, Cuff Size: Normal)   Pulse 83   Ht 5' 8  (1.727 m)   Wt 179 lb 9.6 oz (81.5 kg)   SpO2 99%   BMI 27.31 kg/m     Wt Readings from Last 3 Encounters:  08/03/24 179 lb 9.6 oz (81.5 kg)  02/12/24 203 lb 6.4 oz (92.3 kg)  11/06/23 216 lb 8 oz (98.2 kg)     GEN: Well nourished, well developed in no acute distress HEENT: Normal NECK: No JVD; No carotid bruits LYMPHATICS: No lymphadenopathy CARDIAC: S1S2 noted,RRR, no murmurs, rubs, gallops RESPIRATORY:  Clear to auscultation without rales, wheezing or rhonchi  ABDOMEN: Soft, non-tender, non-distended, +bowel sounds, no guarding. EXTREMITIES: No edema, No cyanosis, no clubbing MUSCULOSKELETAL:  No deformity  SKIN: Warm and dry NEUROLOGIC:  Alert and oriented x 3, non-focal PSYCHIATRIC:  Normal affect, good insight  ASSESSMENT:    1. Hyperlipidemia, unspecified hyperlipidemia type   2. Coronary artery disease involving native coronary artery of native heart without angina pectoris   3. Primary hypertension     PLAN:    Essential Hypertension -her blood pressure is at target.  Continue the current dose of carvedilol  6.25 mg twice daily, losartan  100 mg daily and amlodipine  10 mg daily.  Coronary artery disease-moderate coronary artery disease we discussed use of aspirin  she tells me that she has tolerated ibuprofen in the past and we will go ahead and start aspirin  81 mg daily.  Did tell the patient to start and let me know if she experiences any side effects.  Continue atorvastatin  20 mg daily her LDL goal is less than 70.    The patient is in agreement with the above plan. The patient left the office in stable condition.  The patient will follow up in   Medication Adjustments/Labs and Tests Ordered: Current medicines are reviewed at length with the patient today.  Concerns regarding medicines are outlined above.  Orders Placed This Encounter  Procedures   Lipid panel   No orders of the defined types were placed in this encounter.   Patient Instructions   Medication Instructions:  Your physician recommends that you continue on your current medications as directed. Please refer to the Current Medication list given to  you today.  *If you need a refill on your cardiac medications before your next appointment, please call your pharmacy*  Lab Work: Lipids If you have labs (blood work) drawn today and your tests are completely normal, you will receive your results only by: MyChart Message (if you have MyChart) OR A paper copy in the mail If you have any lab test that is abnormal or we need to change your treatment, we will call you to review the results.  Follow-Up: At [facility], you and your health needs are our priority.  As part of our continuing mission to provide you with exceptional heart care, our providers are all part of one team.  This team includes your primary Cardiologist (physician) and Advanced Practice Providers or APPs (Physician Assistants and Nurse Practitioners) who all work together to provide you with the care you need, when you need it.  Your next appointment:   1 year(s)  Provider:   Laylanie Kruczek, DO    Other Instructions:            Adopting a Healthy Lifestyle.  Know what a healthy weight is for you (roughly BMI <25) and aim to maintain this   Aim for 7+ servings of fruits and vegetables daily   65-80+ fluid ounces of water or unsweet tea for healthy kidneys   Limit to max 1 drink of alcohol per day; avoid smoking/tobacco   Limit animal fats in diet for cholesterol and heart health - choose grass fed whenever available   Avoid highly processed foods, and foods high in saturated/trans fats   Aim for low stress - take time to unwind and care for your mental health   Aim for 150 min of moderate intensity exercise weekly for heart health, and weights twice weekly for bone health   Aim for 7-9 hours of sleep daily   When it comes to diets, agreement about the perfect plan isnt easy to find,  even among the experts. Experts at the [facility] [employer] developed an idea known as the Healthy Eating Plate. Just imagine a plate divided into logical, healthy portions.   The emphasis is on diet quality:   Load up on vegetables and fruits - one-half of your plate: Aim for color and variety, and remember that potatoes dont count.   Go for whole grains - one-quarter of your plate: Whole wheat, barley, wheat berries, quinoa, oats, brown rice, and foods made with them. If you want pasta, go with whole wheat pasta.   Protein power - one-quarter of your plate: Fish, chicken, beans, and nuts are all healthy, versatile protein sources. Limit red meat.   The diet, however, does go beyond the plate, offering a few other suggestions.   Use healthy plant oils, such as olive, canola, soy, corn, sunflower and peanut. Check the labels, and avoid partially hydrogenated oil, which have unhealthy trans fats.   If youre thirsty, drink water. Coffee and tea are good in moderation, but skip sugary drinks and limit milk and dairy products to one or two daily servings.   The type of carbohydrate in the diet is more important than the amount. Some sources of carbohydrates, such as vegetables, fruits, whole grains, and beans-are healthier than others.   Finally, stay active  Signed, Kristinia Leavy, DO  08/03/2024 1:57 PM    [facility] "

## 2024-08-10 ENCOUNTER — Ambulatory Visit: Payer: Self-pay | Admitting: Cardiology

## 2024-08-10 LAB — LIPID PANEL
Chol/HDL Ratio: 2.4 ratio (ref 0.0–4.4)
Cholesterol, Total: 127 mg/dL (ref 100–199)
HDL: 54 mg/dL
LDL Chol Calc (NIH): 61 mg/dL (ref 0–99)
Triglycerides: 54 mg/dL (ref 0–149)
VLDL Cholesterol Cal: 12 mg/dL (ref 5–40)
